# Patient Record
Sex: Male | Born: 1960
Health system: Southern US, Community
[De-identification: ages and names within clinical notes are randomized; demographics above are authoritative.]

## PROBLEM LIST (undated history)

## (undated) ENCOUNTER — Emergency Department: Admission: EM | Discharge status: Absent without leave | Payer: BC Managed Care – PPO

## (undated) DIAGNOSIS — I10 Essential (primary) hypertension: Secondary | ICD-10-CM

## (undated) DIAGNOSIS — K219 Gastro-esophageal reflux disease without esophagitis: Secondary | ICD-10-CM

## (undated) DIAGNOSIS — I219 Acute myocardial infarction, unspecified: Secondary | ICD-10-CM

## (undated) DIAGNOSIS — F1010 Alcohol abuse, uncomplicated: Secondary | ICD-10-CM

## (undated) DIAGNOSIS — E7800 Pure hypercholesterolemia, unspecified: Secondary | ICD-10-CM

## (undated) DIAGNOSIS — F513 Sleepwalking [somnambulism]: Secondary | ICD-10-CM

## (undated) DIAGNOSIS — I609 Nontraumatic subarachnoid hemorrhage, unspecified: Secondary | ICD-10-CM

## (undated) DIAGNOSIS — F32A Depression, unspecified: Secondary | ICD-10-CM

## (undated) DIAGNOSIS — F329 Major depressive disorder, single episode, unspecified: Secondary | ICD-10-CM

## (undated) HISTORY — DX: Nontraumatic subarachnoid hemorrhage, unspecified: I60.9

## (undated) HISTORY — PX: KNEE SURGERY: SHX244

## (undated) HISTORY — PX: APPENDECTOMY: SHX54

---
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
## 2006-11-16 ENCOUNTER — Other Ambulatory Visit: Payer: Self-pay

## 2006-11-16 ENCOUNTER — Emergency Department: Payer: Self-pay | Admitting: Emergency Medicine

## 2009-07-15 ENCOUNTER — Observation Stay: Payer: Self-pay | Admitting: *Deleted

## 2015-07-28 ENCOUNTER — Ambulatory Visit
Admission: RE | Admit: 2015-07-28 | Discharge: 2015-07-28 | Disposition: A | Discharge status: Home / Self Care | Payer: BLUE CROSS/BLUE SHIELD | Source: Ambulatory Visit | Attending: Family Medicine | Admitting: Family Medicine

## 2015-07-28 ENCOUNTER — Other Ambulatory Visit: Payer: Self-pay | Admitting: Family Medicine

## 2015-07-28 DIAGNOSIS — M25562 Pain in left knee: Secondary | ICD-10-CM

## 2015-07-28 DIAGNOSIS — M25561 Pain in right knee: Secondary | ICD-10-CM | POA: Insufficient documentation

## 2015-10-18 DIAGNOSIS — H52223 Regular astigmatism, bilateral: Secondary | ICD-10-CM | POA: Diagnosis not present

## 2015-10-18 DIAGNOSIS — H5203 Hypermetropia, bilateral: Secondary | ICD-10-CM | POA: Diagnosis not present

## 2015-10-18 DIAGNOSIS — H524 Presbyopia: Secondary | ICD-10-CM | POA: Diagnosis not present

## 2015-11-09 DIAGNOSIS — C44619 Basal cell carcinoma of skin of left upper limb, including shoulder: Secondary | ICD-10-CM | POA: Diagnosis not present

## 2015-11-09 DIAGNOSIS — L570 Actinic keratosis: Secondary | ICD-10-CM | POA: Diagnosis not present

## 2015-11-09 DIAGNOSIS — D225 Melanocytic nevi of trunk: Secondary | ICD-10-CM | POA: Diagnosis not present

## 2015-11-09 DIAGNOSIS — D485 Neoplasm of uncertain behavior of skin: Secondary | ICD-10-CM | POA: Diagnosis not present

## 2015-12-05 DIAGNOSIS — L905 Scar conditions and fibrosis of skin: Secondary | ICD-10-CM | POA: Diagnosis not present

## 2015-12-05 DIAGNOSIS — D225 Melanocytic nevi of trunk: Secondary | ICD-10-CM | POA: Diagnosis not present

## 2016-07-08 DIAGNOSIS — I219 Acute myocardial infarction, unspecified: Secondary | ICD-10-CM

## 2016-07-08 HISTORY — DX: Acute myocardial infarction, unspecified: I21.9

## 2016-11-20 ENCOUNTER — Emergency Department: Payer: BLUE CROSS/BLUE SHIELD

## 2016-11-20 ENCOUNTER — Emergency Department
Admission: EM | Admit: 2016-11-20 | Discharge: 2016-11-20 | Disposition: A | Discharge status: Home / Self Care | Payer: BLUE CROSS/BLUE SHIELD | Attending: Emergency Medicine | Admitting: Emergency Medicine

## 2016-11-20 ENCOUNTER — Encounter: Payer: Self-pay | Admitting: Emergency Medicine

## 2016-11-20 DIAGNOSIS — W182XXA Fall in (into) shower or empty bathtub, initial encounter: Secondary | ICD-10-CM | POA: Diagnosis not present

## 2016-11-20 DIAGNOSIS — Y939 Activity, unspecified: Secondary | ICD-10-CM | POA: Insufficient documentation

## 2016-11-20 DIAGNOSIS — Y999 Unspecified external cause status: Secondary | ICD-10-CM | POA: Diagnosis not present

## 2016-11-20 DIAGNOSIS — S0181XA Laceration without foreign body of other part of head, initial encounter: Secondary | ICD-10-CM | POA: Insufficient documentation

## 2016-11-20 DIAGNOSIS — R079 Chest pain, unspecified: Secondary | ICD-10-CM | POA: Diagnosis not present

## 2016-11-20 DIAGNOSIS — F10929 Alcohol use, unspecified with intoxication, unspecified: Secondary | ICD-10-CM

## 2016-11-20 DIAGNOSIS — S02401A Maxillary fracture, unspecified, initial encounter for closed fracture: Secondary | ICD-10-CM | POA: Diagnosis not present

## 2016-11-20 DIAGNOSIS — S0231XA Fracture of orbital floor, right side, initial encounter for closed fracture: Secondary | ICD-10-CM | POA: Insufficient documentation

## 2016-11-20 DIAGNOSIS — F17210 Nicotine dependence, cigarettes, uncomplicated: Secondary | ICD-10-CM | POA: Insufficient documentation

## 2016-11-20 DIAGNOSIS — S199XXA Unspecified injury of neck, initial encounter: Secondary | ICD-10-CM | POA: Diagnosis not present

## 2016-11-20 DIAGNOSIS — I10 Essential (primary) hypertension: Secondary | ICD-10-CM | POA: Diagnosis not present

## 2016-11-20 DIAGNOSIS — F10129 Alcohol abuse with intoxication, unspecified: Secondary | ICD-10-CM | POA: Insufficient documentation

## 2016-11-20 DIAGNOSIS — Y92009 Unspecified place in unspecified non-institutional (private) residence as the place of occurrence of the external cause: Secondary | ICD-10-CM | POA: Diagnosis not present

## 2016-11-20 DIAGNOSIS — W19XXXA Unspecified fall, initial encounter: Secondary | ICD-10-CM

## 2016-11-20 DIAGNOSIS — S0990XA Unspecified injury of head, initial encounter: Secondary | ICD-10-CM | POA: Diagnosis not present

## 2016-11-20 DIAGNOSIS — S0993XA Unspecified injury of face, initial encounter: Secondary | ICD-10-CM | POA: Diagnosis present

## 2016-11-20 DIAGNOSIS — R0781 Pleurodynia: Secondary | ICD-10-CM | POA: Diagnosis not present

## 2016-11-20 DIAGNOSIS — S0101XA Laceration without foreign body of scalp, initial encounter: Secondary | ICD-10-CM | POA: Diagnosis not present

## 2016-11-20 HISTORY — DX: Essential (primary) hypertension: I10

## 2016-11-20 HISTORY — DX: Gastro-esophageal reflux disease without esophagitis: K21.9

## 2016-11-20 HISTORY — DX: Sleepwalking (somnambulism): F51.3

## 2016-11-20 LAB — COMPREHENSIVE METABOLIC PANEL
ALK PHOS: 57 U/L (ref 38–126)
ALT: 29 U/L (ref 17–63)
AST: 29 U/L (ref 15–41)
Albumin: 4.2 g/dL (ref 3.5–5.0)
Anion gap: 11 (ref 5–15)
BUN: 14 mg/dL (ref 6–20)
CALCIUM: 9.4 mg/dL (ref 8.9–10.3)
CHLORIDE: 96 mmol/L — AB (ref 101–111)
CO2: 25 mmol/L (ref 22–32)
CREATININE: 0.79 mg/dL (ref 0.61–1.24)
GFR calc Af Amer: >60 mL/min (ref >60)
GFR calc non Af Amer: >60 mL/min (ref >60)
Glucose, Bld: 100 mg/dL — ABNORMAL HIGH (ref 65–99)
Potassium: 3.9 mmol/L (ref 3.5–5.1)
Sodium: 132 mmol/L — ABNORMAL LOW (ref 135–145)
Total Bilirubin: 0.6 mg/dL (ref 0.3–1.2)
Total Protein: 7.6 g/dL (ref 6.5–8.1)

## 2016-11-20 LAB — CBC WITH DIFFERENTIAL/PLATELET
Basophils Absolute: 0.1 10*3/uL (ref 0–0.1)
Basophils Relative: 1 %
Eosinophils Absolute: 0 10*3/uL (ref 0–0.7)
Eosinophils Relative: 0 %
HEMATOCRIT: 48.4 % (ref 40.0–52.0)
Hemoglobin: 16.8 g/dL (ref 13.0–18.0)
LYMPHS PCT: 13 %
Lymphs Abs: 1.4 10*3/uL (ref 1.0–3.6)
MCH: 32.9 pg (ref 26.0–34.0)
MCHC: 34.8 g/dL (ref 32.0–36.0)
MCV: 94.8 fL (ref 80.0–100.0)
MONO ABS: 1.1 10*3/uL — AB (ref 0.2–1.0)
MONOS PCT: 11 %
NEUTROS ABS: 8 10*3/uL — AB (ref 1.4–6.5)
Neutrophils Relative %: 75 %
Platelets: 296 10*3/uL (ref 150–440)
RBC: 5.11 MIL/uL (ref 4.40–5.90)
RDW: 15.1 % — AB (ref 11.5–14.5)
WBC: 10.6 10*3/uL (ref 3.8–10.6)

## 2016-11-20 LAB — ETHANOL: ALCOHOL ETHYL (B): 139 mg/dL — AB (ref <5)

## 2016-11-20 LAB — TROPONIN I: Troponin I: <0.03 ng/mL (ref <0.03)

## 2016-11-20 LAB — MAGNESIUM: Magnesium: 1.8 mg/dL (ref 1.7–2.4)

## 2016-11-20 MED ORDER — AMOXICILLIN-POT CLAVULANATE 875-125 MG PO TABS
1.0000 | ORAL_TABLET | Freq: Once | ORAL | Status: DC
  Filled 2016-11-20: qty 1

## 2016-11-20 MED ORDER — ONDANSETRON 4 MG PO TBDP
4.0000 mg | ORAL_TABLET | Freq: Once | ORAL | Status: AC
  Administered 2016-11-20: 4 mg via ORAL
  Filled 2016-11-20: qty 1

## 2016-11-20 MED ORDER — CLINDAMYCIN HCL 150 MG PO CAPS
300.0000 mg | ORAL_CAPSULE | Freq: Once | ORAL | Status: AC
  Administered 2016-11-20: 300 mg via ORAL
  Filled 2016-11-20: qty 2

## 2016-11-20 MED ORDER — ACETAMINOPHEN 500 MG PO TABS
ORAL_TABLET | ORAL | Status: AC
Start: 2016-11-20 — End: 2016-11-20
  Administered 2016-11-20: 1000 mg
  Filled 2016-11-20: qty 2

## 2016-11-20 MED ORDER — ONDANSETRON 4 MG PO TBDP: ORAL_TABLET | ORAL | 0 refills | Status: DC

## 2016-11-20 MED ORDER — CLINDAMYCIN HCL 300 MG PO CAPS: 300.0000 mg | ORAL_CAPSULE | Freq: Four times a day (QID) | ORAL | 0 refills | Status: AC

## 2016-11-20 MED ORDER — LIDOCAINE HCL (PF) 1 % IJ SOLN
5.0000 mL | Freq: Once | INTRAMUSCULAR | Status: AC
  Administered 2016-11-20: 5 mL
  Filled 2016-11-20: qty 5

## 2016-11-20 NOTE — ED Notes (Signed)
Wound sutured  Tolerated well  D/c inst to pt.  Iv dced.  Pt alert.  Speech clear.

## 2016-11-20 NOTE — Discharge Instructions (Signed)
You have been seen in the Emergency Department (ED) today for a fall.  Your work up does not show any concerning injuries.  Please take over-the-counter ibuprofen and/or Tylenol as needed for your pain (unless you have an allergy or your doctor as told you not to take them), or take any prescribed medication as instructed.  Please follow up with your doctor regarding today's Emergency Department (ED) visit and your recent fall.  You should have your sutures removed in about a week at your doctor's office.  Return to the ED if you have any headache, confusion, slurred speech, weakness/numbness of any arm or leg, or any increased pain.

## 2016-11-20 NOTE — ED Notes (Addendum)
Pt has laceration above right eyebrow and bruising to right side of face.  Pt fell down a flight of steps and does not remember what happened.   Pt reports drinking 2 beers after work today.  Denies drug use.  No neck or back pain.  No headache. Pt also reports right anterior rib pain.  No sob.  No  Chest pain.   Pt alert  Speech clear.   Wife with pt.

## 2016-11-20 NOTE — ED Provider Notes (Signed)
[facility] Emergency Department Provider Note  ____________________________________________   First MD Initiated Contact with Patient 11/20/16 1724     (approximate)  I have reviewed the triage vital signs and the nursing notes.   HISTORY  Chief Complaint Head Injury; Facial Laceration; and Facial Swelling    HPI Dustin Velazquez is a 56 y.o. male who presents for evaluation of head and facial injury after an unwitnessed fall at home.  He is generally healthy but does have a history of sleepwalking.  He reports that he gets up every day at 3 AM to go to work and then came home in the early afternoon, had a few beers, and fell asleep in his chair down stairs.  The next thing he knew he was in the shower upstairs with blood on the floor and a missing tooth and pain in his face and head.  His wife came home shortly thereafter and saw that there was also blood around the chair where he presumably fell initially.  He has no memory of any of that.  He denies chest pain except for point tenderness in the right lateral ribs where he thinks he must have fallen.  He has no neck pain, shortness of breath, abdominal pain, or pain in any of his extremities.  He is ambulatory without difficulty and denies any dizziness or lightheadedness.  Only alert and oriented in spite of the obvious head and facial injuries on the right side.  He describes the pain as moderate and aching and throbbing.  Touching his face and head makes it worse and nothing makes it better.  There is a laceration above his right eyebrow but leading is well controlled.  He reports that he is up-to-date on his tetanus vaccination due [place] the city.   Past Medical History:  Diagnosis Date  . GERD (gastroesophageal reflux disease)   . Hypertension   . Sleep walking disorder     There are no active problems to display for this patient.   Past Surgical History:  Procedure Laterality Date  .  APPENDECTOMY    . KNEE SURGERY      Prior to Admission medications   Medication Sig Start Date End Date Taking? Authorizing Provider  aspirin EC 81 MG tablet Take 81 mg by mouth daily.   Yes [provider]  hydrochlorothiazide (HYDRODIURIL) 25 MG tablet Take 25 mg by mouth daily. 09/09/16  Yes [provider]  lisinopril (PRINIVIL,ZESTRIL) 40 MG tablet Take 40 mg by mouth daily. 11/07/16  Yes [provider]  omeprazole (PRILOSEC) 20 MG capsule Take 20 mg by mouth daily. 11/19/16  Yes [provider]  clindamycin (CLEOCIN) 300 MG capsule Take 1 capsule (300 mg total) by mouth 4 (four) times daily. 11/20/16 11/27/16  Loleta Rose, MD  ondansetron (ZOFRAN ODT) 4 MG disintegrating tablet Allow 1-2 tablets to dissolve in your mouth every 8 hours as needed for nausea/vomiting 11/20/16   Loleta Rose, MD    Allergies Compazine [prochlorperazine edisylate]  History reviewed. No pertinent family history.  Social History Social History  Substance Use Topics  . Smoking status: Current Every Day Smoker    Types: Cigarettes  . Smokeless tobacco: Never Used  . Alcohol use Yes    Review of Systems Constitutional: No fever/chills Eyes: No visual changes.  Black eye on the right with some swelling ENT: No sore throat. Cardiovascular: Denies chest pain except for right lateral point rib tenderness Respiratory: Denies shortness  of breath. Gastrointestinal: No abdominal pain.  No nausea, no vomiting.  No diarrhea.  No constipation. Genitourinary: Negative for dysuria. Musculoskeletal: Negative for neck and back pain. Integumentary: Negative for rash. Laceration above right eyebrow with contusion and swelling Neurological: generalized throbbing headache.  No focal numbness nor weakness, no dizziness.  ____________________________________________   PHYSICAL EXAM:  VITAL SIGNS: ED Triage Vitals  Enc Vitals Group     BP 11/20/16 1630 136/80     Pulse Rate  11/20/16 1630 98     Resp 11/20/16 1630 18     Temp 11/20/16 1630 98.9 F (37.2 C)     Temp Source 11/20/16 1630 Oral     SpO2 11/20/16 1630 98 %     Weight 11/20/16 1631 194 lb (88 kg)     Height 11/20/16 1631 5\' 9"  (1.753 m)     Head Circumference --      Peak Flow --      Pain Score 11/20/16 1630 0     Pain Loc --      Pain Edu? --      Excl. in GC? --     Constitutional: Alert and oriented. No acute distress. Eyes: Periorbital swelling on the right side with ecchymosis consistent with a contusion to the right side of his face.  Extraocular motion is intact and pupils are equal and reactive bilaterally.  There is tenderness to palpation around the eye.  Laceration above right eyebrow as described below in the skin exam Head: Evidence of right forehead and right facial contusion/trauma as described above. Nose: No congestion/rhinnorhea. Mouth/Throat: Mucous membranes are moist.  Bottom center Missing tooth that the patient says was not missing prior to the fall but no other obvious dental injury Neck: No stridor.  No meningeal signs.  No cervical spine tenderness to palpation. Cardiovascular: Normal rate, regular rhythm. Good peripheral circulation. Grossly normal heart sounds. Respiratory: Normal respiratory effort.  No retractions. Lungs CTAB.  Easily reproducible point tenderness to palpation of the right lateral ribs just below the nipple Gastrointestinal: Soft and nontender. No distention.  Musculoskeletal: No lower extremity tenderness nor edema. No gross deformities of extremities. Neurologic:  Normal speech and language. No gross focal neurologic deficits are appreciated.  Skin:  3.8-cm laceration above right eyebrow, repaired as described in procedure note Psychiatric: Mood and affect are normal. Speech and behavior are normal.  ____________________________________________   LABS (all labs ordered are listed, but only abnormal results are displayed)  Labs Reviewed    COMPREHENSIVE METABOLIC PANEL - Abnormal; Notable for the following:       Result Value   Sodium 132 (*)    Chloride 96 (*)    Glucose, Bld 100 (*)    All other components within normal limits  ETHANOL - Abnormal; Notable for the following:    Alcohol, Ethyl (B) 139 (*)    All other components within normal limits  CBC WITH DIFFERENTIAL/PLATELET - Abnormal; Notable for the following:    RDW 15.1 (*)    Neutro Abs 8.0 (*)    Monocytes Absolute 1.1 (*)    All other components within normal limits  TROPONIN I  MAGNESIUM   ____________________________________________  EKG  ED ECG REPORT I, Narjis Mira, the attending physician, personally viewed and interpreted this ECG.  Date: 11/20/2016 EKG Time: 18:10 Rate: 87 Rhythm: normal sinus rhythm QRS Axis: normal Intervals: normal ST/T Wave abnormalities: normal Conduction Disturbances: none Narrative Interpretation: unremarkable  ____________________________________________  RADIOLOGY   Dg Ribs Unilateral  W/chest Right  Result Date: 11/20/2016 CLINICAL DATA:  Fall in shower. Right rib and chest pain. Initial encounter. EXAM: RIGHT RIBS AND CHEST - 3+ VIEW COMPARISON:  07/14/2009 FINDINGS: No fracture or other bone lesions are seen involving the ribs. There is no evidence of pneumothorax or pleural effusion. Both lungs are clear. Heart size and mediastinal contours are within normal limits. IMPRESSION: Negative. Electronically Signed   By: Myles Rosenthal M.D.   On: 11/20/2016 18:02   Ct Head Wo Contrast  Result Date: 11/20/2016 CLINICAL DATA:  Initial evaluation for acute trauma, fall. EXAM: CT HEAD WITHOUT CONTRAST CT MAXILLOFACIAL WITHOUT CONTRAST CT CERVICAL SPINE WITHOUT CONTRAST TECHNIQUE: Multidetector CT imaging of the head, cervical spine, and maxillofacial structures were performed using the standard protocol without intravenous contrast. Multiplanar CT image reconstructions of the cervical spine and maxillofacial  structures were also generated. COMPARISON:  None. FINDINGS: CT HEAD FINDINGS Brain: Mild age-related cerebral atrophy. The the no acute intracranial hemorrhage. No evidence for acute large vessel territory infarct. No mass lesion, midline shift or mass effect. No hydrocephalus. No extra-axial fluid collection. Vascular: No hyperdense vessel. Scattered vascular calcifications noted within the carotid siphons. Skull: Soft tissue contusion with laceration present at the right forehead/ periorbital region. Scalp soft tissues otherwise unremarkable. Calvarium intact. Other: Trace left mastoid effusion noted. CT MAXILLOFACIAL FINDINGS Osseous: Zygomatic arches intact. Subtle acute fracture through the posterior wall of the right maxillary sinus, extending into the right orbital floor. No other acute maxillary fracture. Pterygoid plates intact. Nasal bones intact. Nasal septum bowed to the right but intact. No acute mandibular fracture. Mandibular condyles normally situated. No acute abnormality about the dentition. Poor dentition noted. Orbits: Globes intact. No retro-orbital hematoma. There is a subtle acute nondisplaced fracture through the right orbital floor (series 13, image 28). Extension through the posterior wall of the right max O sinus. Extra-ocular muscles remain normally position within the bony orbit. Lamina papyracea and orbital roof remain intact. No acute abnormality about the bony left orbit. Sinuses: Layering blood present within the left max O sinus. Scattered mucosal thickening within the ethmoidal air cells. Soft tissues: Right soft tissue contusion with laceration at the right forehead and periorbital region. No other appreciable soft tissue injury within the face. CT CERVICAL SPINE FINDINGS Alignment: Straightening with slight reversal of the normal cervical lordosis. Trace anterolisthesis of C5 on C6, likely chronic. Skull base and vertebrae: Skullbase intact. Normal C1-2 arch articulations  preserved. Dens is intact. Vertebral body heights maintained. No acute fracture. Soft tissues and spinal canal: Soft tissues of the neck demonstrate no acute abnormality. No prevertebral edema. Vascular calcifications about the left bifurcation. Disc levels: Moderate degenerative spondylolysis present at C5-6 and C6-7. Upper chest: Visualized upper chest demonstrates no acute abnormality. Visualized lung apices are clear. No apical pneumothorax. Mild paraseptal emphysema noted at the right lung apex. IMPRESSION: CT HEAD: No acute intracranial process identified. CT MAXILLOFACIAL: 1. Acute nondisplaced fracture involving the right orbital floor with extension through the posterior wall of the right maxillary sinus. Intact globes with no retro-orbital hematoma. 2. Soft tissue contusion with laceration at the right forehead and periorbital region. CT CERVICAL SPINE: 1. No acute traumatic injury within cervical spine. 2. Moderate degenerate spondylolysis at C5-6 and C6-7. Electronically Signed   By: Rise Mu M.D.   On: 11/20/2016 18:27   Ct Cervical Spine Wo Contrast  Result Date: 11/20/2016 CLINICAL DATA:  Initial evaluation for acute trauma, fall. EXAM: CT HEAD WITHOUT CONTRAST CT MAXILLOFACIAL WITHOUT  CONTRAST CT CERVICAL SPINE WITHOUT CONTRAST TECHNIQUE: Multidetector CT imaging of the head, cervical spine, and maxillofacial structures were performed using the standard protocol without intravenous contrast. Multiplanar CT image reconstructions of the cervical spine and maxillofacial structures were also generated. COMPARISON:  None. FINDINGS: CT HEAD FINDINGS Brain: Mild age-related cerebral atrophy. The the no acute intracranial hemorrhage. No evidence for acute large vessel territory infarct. No mass lesion, midline shift or mass effect. No hydrocephalus. No extra-axial fluid collection. Vascular: No hyperdense vessel. Scattered vascular calcifications noted within the carotid siphons. Skull: Soft  tissue contusion with laceration present at the right forehead/ periorbital region. Scalp soft tissues otherwise unremarkable. Calvarium intact. Other: Trace left mastoid effusion noted. CT MAXILLOFACIAL FINDINGS Osseous: Zygomatic arches intact. Subtle acute fracture through the posterior wall of the right maxillary sinus, extending into the right orbital floor. No other acute maxillary fracture. Pterygoid plates intact. Nasal bones intact. Nasal septum bowed to the right but intact. No acute mandibular fracture. Mandibular condyles normally situated. No acute abnormality about the dentition. Poor dentition noted. Orbits: Globes intact. No retro-orbital hematoma. There is a subtle acute nondisplaced fracture through the right orbital floor (series 13, image 28). Extension through the posterior wall of the right max O sinus. Extra-ocular muscles remain normally position within the bony orbit. Lamina papyracea and orbital roof remain intact. No acute abnormality about the bony left orbit. Sinuses: Layering blood present within the left max O sinus. Scattered mucosal thickening within the ethmoidal air cells. Soft tissues: Right soft tissue contusion with laceration at the right forehead and periorbital region. No other appreciable soft tissue injury within the face. CT CERVICAL SPINE FINDINGS Alignment: Straightening with slight reversal of the normal cervical lordosis. Trace anterolisthesis of C5 on C6, likely chronic. Skull base and vertebrae: Skullbase intact. Normal C1-2 arch articulations preserved. Dens is intact. Vertebral body heights maintained. No acute fracture. Soft tissues and spinal canal: Soft tissues of the neck demonstrate no acute abnormality. No prevertebral edema. Vascular calcifications about the left bifurcation. Disc levels: Moderate degenerative spondylolysis present at C5-6 and C6-7. Upper chest: Visualized upper chest demonstrates no acute abnormality. Visualized lung apices are clear. No  apical pneumothorax. Mild paraseptal emphysema noted at the right lung apex. IMPRESSION: CT HEAD: No acute intracranial process identified. CT MAXILLOFACIAL: 1. Acute nondisplaced fracture involving the right orbital floor with extension through the posterior wall of the right maxillary sinus. Intact globes with no retro-orbital hematoma. 2. Soft tissue contusion with laceration at the right forehead and periorbital region. CT CERVICAL SPINE: 1. No acute traumatic injury within cervical spine. 2. Moderate degenerate spondylolysis at C5-6 and C6-7. Electronically Signed   By: Rise Mu M.D.   On: 11/20/2016 18:27   Ct Maxillofacial Wo Cm  Result Date: 11/20/2016 CLINICAL DATA:  Initial evaluation for acute trauma, fall. EXAM: CT HEAD WITHOUT CONTRAST CT MAXILLOFACIAL WITHOUT CONTRAST CT CERVICAL SPINE WITHOUT CONTRAST TECHNIQUE: Multidetector CT imaging of the head, cervical spine, and maxillofacial structures were performed using the standard protocol without intravenous contrast. Multiplanar CT image reconstructions of the cervical spine and maxillofacial structures were also generated. COMPARISON:  None. FINDINGS: CT HEAD FINDINGS Brain: Mild age-related cerebral atrophy. The the no acute intracranial hemorrhage. No evidence for acute large vessel territory infarct. No mass lesion, midline shift or mass effect. No hydrocephalus. No extra-axial fluid collection. Vascular: No hyperdense vessel. Scattered vascular calcifications noted within the carotid siphons. Skull: Soft tissue contusion with laceration present at the right forehead/ periorbital region. Scalp soft  tissues otherwise unremarkable. Calvarium intact. Other: Trace left mastoid effusion noted. CT MAXILLOFACIAL FINDINGS Osseous: Zygomatic arches intact. Subtle acute fracture through the posterior wall of the right maxillary sinus, extending into the right orbital floor. No other acute maxillary fracture. Pterygoid plates intact. Nasal  bones intact. Nasal septum bowed to the right but intact. No acute mandibular fracture. Mandibular condyles normally situated. No acute abnormality about the dentition. Poor dentition noted. Orbits: Globes intact. No retro-orbital hematoma. There is a subtle acute nondisplaced fracture through the right orbital floor (series 13, image 28). Extension through the posterior wall of the right max O sinus. Extra-ocular muscles remain normally position within the bony orbit. Lamina papyracea and orbital roof remain intact. No acute abnormality about the bony left orbit. Sinuses: Layering blood present within the left max O sinus. Scattered mucosal thickening within the ethmoidal air cells. Soft tissues: Right soft tissue contusion with laceration at the right forehead and periorbital region. No other appreciable soft tissue injury within the face. CT CERVICAL SPINE FINDINGS Alignment: Straightening with slight reversal of the normal cervical lordosis. Trace anterolisthesis of C5 on C6, likely chronic. Skull base and vertebrae: Skullbase intact. Normal C1-2 arch articulations preserved. Dens is intact. Vertebral body heights maintained. No acute fracture. Soft tissues and spinal canal: Soft tissues of the neck demonstrate no acute abnormality. No prevertebral edema. Vascular calcifications about the left bifurcation. Disc levels: Moderate degenerative spondylolysis present at C5-6 and C6-7. Upper chest: Visualized upper chest demonstrates no acute abnormality. Visualized lung apices are clear. No apical pneumothorax. Mild paraseptal emphysema noted at the right lung apex. IMPRESSION: CT HEAD: No acute intracranial process identified. CT MAXILLOFACIAL: 1. Acute nondisplaced fracture involving the right orbital floor with extension through the posterior wall of the right maxillary sinus. Intact globes with no retro-orbital hematoma. 2. Soft tissue contusion with laceration at the right forehead and periorbital region. CT  CERVICAL SPINE: 1. No acute traumatic injury within cervical spine. 2. Moderate degenerate spondylolysis at C5-6 and C6-7. Electronically Signed   By: Rise Mu M.D.   On: 11/20/2016 18:27    ____________________________________________   PROCEDURES  Critical Care performed: No   Procedure(s) performed:   Marland KitchenMarland KitchenLaceration Repair Date/Time: 11/20/2016 7:29 PM Performed by: Loleta Rose Authorized by: Loleta Rose   Consent:    Consent obtained:  Verbal   Consent given by:  Patient   Risks discussed:  Infection, pain, retained foreign body, poor cosmetic result and poor wound healing Anesthesia (see MAR for exact dosages):    Anesthesia method:  Local infiltration   Local anesthetic:  Lidocaine 1% w/o epi Laceration details:    Location:  Face   Face location:  Forehead   Length (cm):  3.8 Repair type:    Repair type:  Simple Exploration:    Hemostasis achieved with:  Direct pressure   Wound exploration: entire depth of wound probed and visualized     Contaminated: no   Treatment:    Area cleansed with:  Saline   Amount of cleaning:  Extensive   Irrigation solution:  Sterile saline   Visualized foreign bodies/material removed: no   Skin repair:    Repair method:  Sutures   Suture size:  5-0   Suture material:  Nylon   Suture technique:  Simple interrupted   Number of sutures:  4 Approximation:    Approximation:  Close   Vermilion border: well-aligned   Post-procedure details:    Dressing:  Sterile dressing   Patient tolerance of procedure:  Tolerated well, no immediate complications     ____________________________________________   INITIAL IMPRESSION / ASSESSMENT AND PLAN / ED COURSE  Pertinent labs & imaging results that were available during my care of the patient were reviewed by me and considered in my medical decision making (see chart for details).  The patient does have some significant facial and forehead trauma and I will obtain CT scans  of his head, maxillofacial, and cervical spine to make sure there is no bony injury.  He has no clinical evidence of entrapment at this time.  His fall is likely the result of both some alcohol consumption before his nap and his chronic sleep walking, and he has no shortness of breath or chest pain.  I will check his electrolytes and a CBC as well as a troponin to try to make sure there is no evidence of any other acute or emergent condition leading to his fall, but in the absence of anything else I suspect he simply had a fall for the reasons described above and should be appropriate for outpatient follow-up.  Testing rib radiographs are also pending to determine if he has a rib fracture.  His forehead laceration will require suturing.   Clinical Course as of Nov 21 1935  Wed Nov 20, 2016  1812 No evidence of rib fracture(s) DG Ribs Unilateral W/Chest Right [CF]  1930 patient has a nondisplaced fracture of the right orbital floor with extension into the maxillary sinuses.  I ordered Augmentin but the patient refused because it has serious GI side effects.  The recommended alternative is clindamycin to which he agreed.  I gave him a first dose here and I am giving him ENT follow-up information.  I gave my usual precautions including no nose blowing, opening his mouth when he sneezes, etc.  He will follow up within the week as well as having his sutures removed.  There is no indication of any other acute or emergent medical condition at this time and he and his wife are comfortable with plan to go home.  I gave my usual and customary return precautions.     [CF]    Clinical Course User Index [CF] Loleta Rose, MD    ____________________________________________  FINAL CLINICAL IMPRESSION(S) / ED DIAGNOSES  Final diagnoses:  Closed fracture of right orbital floor, initial encounter [facility])  Maxillary sinus fracture, closed, initial encounter [facility])  Facial laceration, initial encounter  Injury of  head, initial encounter  Fall, initial encounter  Alcoholic intoxication with complication [facility])     MEDICATIONS GIVEN DURING THIS VISIT:  Medications  clindamycin (CLEOCIN) capsule 300 mg   acetaminophen (TYLENOL) 500 MG tablet (1,000 mg  Given 11/20/16 1815)  lidocaine (PF) (XYLOCAINE) 1 % injection 5 mL (5 mLs Other Given 11/20/16 1847)  ondansetron (ZOFRAN-ODT) disintegrating tablet 4 mg (4 mg Oral Given 11/20/16 1847)     NEW OUTPATIENT MEDICATIONS STARTED DURING THIS VISIT:  New Prescriptions   CLINDAMYCIN (CLEOCIN) 300 MG CAPSULE    Take 1 capsule (300 mg total) by mouth 4 (four) times daily.   ONDANSETRON (ZOFRAN ODT) 4 MG DISINTEGRATING TABLET    Allow 1-2 tablets to dissolve in your mouth every 8 hours as needed for nausea/vomiting    Modified Medications   No medications on file    Discontinued Medications   No medications on file     Note:  This document was prepared using Dragon voice recognition software and may include unintentional dictation errors.    York Cerise,  Kandee Keen, MD 11/20/16 [phone]

## 2016-11-20 NOTE — ED Notes (Signed)
Patient transported to CT 

## 2016-11-20 NOTE — ED Notes (Signed)
Pt has a headache,   meds given.  Pt alert  Speech clear

## 2016-11-20 NOTE — ED Triage Notes (Signed)
Pt took a nap today and woke up in the shower. Pt reports history of sleep walking. Pt states he thought he fell in the shower but pt wife reports seeing blood on the floor near the chair where he took the nap. Laceration noted to area above right eyebrow. Bleeding controlled with dressing. Swelling noted to right eye area. Pt denies dizziness. Pt alert and oriented in triage.

## 2016-12-29 ENCOUNTER — Inpatient Hospital Stay
Admission: EM | Admit: 2016-12-29 | Discharge: 2016-12-31 | DRG: 247 | Disposition: A | Discharge status: Home / Self Care | Payer: BLUE CROSS/BLUE SHIELD | Attending: Internal Medicine | Admitting: Internal Medicine

## 2016-12-29 ENCOUNTER — Inpatient Hospital Stay (HOSPITAL_COMMUNITY)
Admit: 2016-12-29 | Discharge: 2016-12-29 | Disposition: A | Discharge status: Home / Self Care | Payer: BLUE CROSS/BLUE SHIELD | Attending: Internal Medicine | Admitting: Internal Medicine

## 2016-12-29 ENCOUNTER — Encounter: Payer: Self-pay | Admitting: Emergency Medicine

## 2016-12-29 ENCOUNTER — Emergency Department: Payer: BLUE CROSS/BLUE SHIELD

## 2016-12-29 DIAGNOSIS — E785 Hyperlipidemia, unspecified: Secondary | ICD-10-CM | POA: Diagnosis present

## 2016-12-29 DIAGNOSIS — E7800 Pure hypercholesterolemia, unspecified: Secondary | ICD-10-CM | POA: Diagnosis present

## 2016-12-29 DIAGNOSIS — K219 Gastro-esophageal reflux disease without esophagitis: Secondary | ICD-10-CM | POA: Diagnosis present

## 2016-12-29 DIAGNOSIS — K210 Gastro-esophageal reflux disease with esophagitis: Secondary | ICD-10-CM | POA: Diagnosis not present

## 2016-12-29 DIAGNOSIS — R079 Chest pain, unspecified: Secondary | ICD-10-CM

## 2016-12-29 DIAGNOSIS — Z8349 Family history of other endocrine, nutritional and metabolic diseases: Secondary | ICD-10-CM

## 2016-12-29 DIAGNOSIS — Z888 Allergy status to other drugs, medicaments and biological substances status: Secondary | ICD-10-CM | POA: Diagnosis not present

## 2016-12-29 DIAGNOSIS — Z716 Tobacco abuse counseling: Secondary | ICD-10-CM | POA: Diagnosis not present

## 2016-12-29 DIAGNOSIS — I2510 Atherosclerotic heart disease of native coronary artery without angina pectoris: Secondary | ICD-10-CM | POA: Diagnosis not present

## 2016-12-29 DIAGNOSIS — I214 Non-ST elevation (NSTEMI) myocardial infarction: Principal | ICD-10-CM | POA: Diagnosis present

## 2016-12-29 DIAGNOSIS — F513 Sleepwalking [somnambulism]: Secondary | ICD-10-CM | POA: Diagnosis not present

## 2016-12-29 DIAGNOSIS — I10 Essential (primary) hypertension: Secondary | ICD-10-CM | POA: Diagnosis present

## 2016-12-29 DIAGNOSIS — Z79899 Other long term (current) drug therapy: Secondary | ICD-10-CM

## 2016-12-29 DIAGNOSIS — F17210 Nicotine dependence, cigarettes, uncomplicated: Secondary | ICD-10-CM | POA: Diagnosis not present

## 2016-12-29 DIAGNOSIS — Z7982 Long term (current) use of aspirin: Secondary | ICD-10-CM | POA: Diagnosis not present

## 2016-12-29 DIAGNOSIS — Z720 Tobacco use: Secondary | ICD-10-CM | POA: Diagnosis not present

## 2016-12-29 DIAGNOSIS — Z8249 Family history of ischemic heart disease and other diseases of the circulatory system: Secondary | ICD-10-CM | POA: Diagnosis not present

## 2016-12-29 HISTORY — DX: Pure hypercholesterolemia, unspecified: E78.00

## 2016-12-29 LAB — BASIC METABOLIC PANEL
Anion gap: 9 (ref 5–15)
BUN: 14 mg/dL (ref 6–20)
CHLORIDE: 96 mmol/L — AB (ref 101–111)
CO2: 30 mmol/L (ref 22–32)
CREATININE: 0.96 mg/dL (ref 0.61–1.24)
Calcium: 9.4 mg/dL (ref 8.9–10.3)
GFR calc Af Amer: >60 mL/min (ref >60)
GFR calc non Af Amer: >60 mL/min (ref >60)
Glucose, Bld: 102 mg/dL — ABNORMAL HIGH (ref 65–99)
POTASSIUM: 4.3 mmol/L (ref 3.5–5.1)
Sodium: 135 mmol/L (ref 135–145)

## 2016-12-29 LAB — APTT: aPTT: 29 seconds (ref 24–36)

## 2016-12-29 LAB — ECHOCARDIOGRAM COMPLETE
Height: 69 in
Weight: 3120 oz

## 2016-12-29 LAB — CBC
HEMATOCRIT: 49.1 % (ref 40.0–52.0)
Hemoglobin: 17 g/dL (ref 13.0–18.0)
MCH: 33.5 pg (ref 26.0–34.0)
MCHC: 34.7 g/dL (ref 32.0–36.0)
MCV: 96.5 fL (ref 80.0–100.0)
PLATELETS: 322 10*3/uL (ref 150–440)
RBC: 5.09 MIL/uL (ref 4.40–5.90)
RDW: 14.5 % (ref 11.5–14.5)
WBC: 7.8 10*3/uL (ref 3.8–10.6)

## 2016-12-29 LAB — HEPARIN LEVEL (UNFRACTIONATED)
HEPARIN UNFRACTIONATED: 0.38 [IU]/mL (ref 0.30–0.70)
Heparin Unfractionated: 0.17 IU/mL — ABNORMAL LOW (ref 0.30–0.70)

## 2016-12-29 LAB — PROTIME-INR
INR: 0.93
Prothrombin Time: 12.5 seconds (ref 11.4–15.2)

## 2016-12-29 LAB — TROPONIN I
TROPONIN I: 1.28 ng/mL — AB (ref <0.03)
Troponin I: 0.1 ng/mL (ref <0.03)
Troponin I: 0.6 ng/mL (ref <0.03)
Troponin I: 1.65 ng/mL (ref <0.03)

## 2016-12-29 MED ORDER — SODIUM CHLORIDE 0.9 % IV SOLN
INTRAVENOUS | Status: DC
  Administered 2016-12-29: 09:00:00 via INTRAVENOUS

## 2016-12-29 MED ORDER — FAMOTIDINE 20 MG PO TABS
40.0000 mg | ORAL_TABLET | Freq: Once | ORAL | Status: DC
  Filled 2016-12-29: qty 2

## 2016-12-29 MED ORDER — ACETAMINOPHEN 325 MG PO TABS: 650.0000 mg | ORAL_TABLET | ORAL | Status: DC | PRN

## 2016-12-29 MED ORDER — NITROGLYCERIN 0.4 MG SL SUBL: 0.4000 mg | SUBLINGUAL_TABLET | SUBLINGUAL | Status: DC | PRN

## 2016-12-29 MED ORDER — ATORVASTATIN CALCIUM 20 MG PO TABS
40.0000 mg | ORAL_TABLET | Freq: Every day | ORAL | Status: DC
  Administered 2016-12-29 – 2016-12-30 (×2): 40 mg via ORAL
  Filled 2016-12-29 (×2): qty 2

## 2016-12-29 MED ORDER — ONDANSETRON HCL 4 MG/2ML IJ SOLN: 4.0000 mg | Freq: Four times a day (QID) | INTRAMUSCULAR | Status: DC | PRN

## 2016-12-29 MED ORDER — GI COCKTAIL ~~LOC~~
30.0000 mL | Freq: Once | ORAL | Status: DC
  Filled 2016-12-29: qty 30

## 2016-12-29 MED ORDER — HEPARIN BOLUS VIA INFUSION
4000.0000 [IU] | Freq: Once | INTRAVENOUS | Status: AC
  Administered 2016-12-29: 4000 [IU] via INTRAVENOUS
  Filled 2016-12-29: qty 4000

## 2016-12-29 MED ORDER — ASPIRIN EC 81 MG PO TBEC
81.0000 mg | DELAYED_RELEASE_TABLET | Freq: Every day | ORAL | Status: DC
  Administered 2016-12-31: 81 mg via ORAL
  Filled 2016-12-29: qty 1

## 2016-12-29 MED ORDER — MORPHINE SULFATE (PF) 2 MG/ML IV SOLN: 2.0000 mg | INTRAVENOUS | Status: DC | PRN

## 2016-12-29 MED ORDER — ASPIRIN EC 81 MG PO TBEC: 81.0000 mg | DELAYED_RELEASE_TABLET | Freq: Every day | ORAL | Status: DC

## 2016-12-29 MED ORDER — NICOTINE 21 MG/24HR TD PT24
21.0000 mg | MEDICATED_PATCH | Freq: Every day | TRANSDERMAL | Status: DC
  Administered 2016-12-29 – 2016-12-31 (×3): 21 mg via TRANSDERMAL
  Filled 2016-12-29 (×3): qty 1

## 2016-12-29 MED ORDER — HEPARIN (PORCINE) IN NACL 100-0.45 UNIT/ML-% IJ SOLN
1450.0000 [IU]/h | INTRAMUSCULAR | Status: DC
  Administered 2016-12-29: 1050 [IU]/h via INTRAVENOUS
  Filled 2016-12-29 (×2): qty 250

## 2016-12-29 MED ORDER — ASPIRIN 81 MG PO CHEW
162.0000 mg | CHEWABLE_TABLET | Freq: Once | ORAL | Status: AC
  Administered 2016-12-29: 162 mg via ORAL
  Filled 2016-12-29: qty 2

## 2016-12-29 MED ORDER — ASPIRIN 81 MG PO CHEW
324.0000 mg | CHEWABLE_TABLET | ORAL | Status: AC
Start: 2016-12-29 — End: 2016-12-29
  Administered 2016-12-29: 324 mg via ORAL

## 2016-12-29 MED ORDER — LISINOPRIL 20 MG PO TABS
40.0000 mg | ORAL_TABLET | Freq: Every day | ORAL | Status: DC
Start: 2016-12-29 — End: 2016-12-31
  Administered 2016-12-29 – 2016-12-31 (×3): 40 mg via ORAL
  Filled 2016-12-29 (×4): qty 2

## 2016-12-29 MED ORDER — METOPROLOL TARTRATE 25 MG PO TABS
25.0000 mg | ORAL_TABLET | Freq: Two times a day (BID) | ORAL | Status: DC
  Administered 2016-12-29 – 2016-12-31 (×5): 25 mg via ORAL
  Filled 2016-12-29 (×5): qty 1

## 2016-12-29 MED ORDER — HEPARIN BOLUS VIA INFUSION
2500.0000 [IU] | Freq: Once | INTRAVENOUS | Status: AC
  Administered 2016-12-29: 2500 [IU] via INTRAVENOUS
  Filled 2016-12-29: qty 2500

## 2016-12-29 MED ORDER — ASPIRIN 300 MG RE SUPP: 300.0000 mg | RECTAL | Status: AC

## 2016-12-29 MED ORDER — PANTOPRAZOLE SODIUM 40 MG PO TBEC
40.0000 mg | DELAYED_RELEASE_TABLET | Freq: Every day | ORAL | Status: DC
  Administered 2016-12-29 – 2016-12-31 (×3): 40 mg via ORAL
  Filled 2016-12-29 (×4): qty 1

## 2016-12-29 NOTE — H&P (Signed)
[facility] - [place] at [facility]   PATIENT NAME: Dustin Velazquez    MR#:  [id number]  DATE OF BIRTH:  1960-12-21  DATE OF ADMISSION:  12/29/2016  PRIMARY CARE PHYSICIAN: Gilles Chiquito, MD   REQUESTING/REFERRING PHYSICIAN:   CHIEF COMPLAINT:   Chief Complaint  Patient presents with  . Chest Pain    HISTORY OF PRESENT ILLNESS: Dustin Velazquez  is a 56 y.o. male with a known history of Gastroesophageal replace disease, hyperlipidemia, hypertension presented to the emergency room with chest pain since yesterday morning. Patient woke up yesterday morning with chest pain which is located in the retrosternal area. He felt it as heartburn and described it as 6 out of 10 on a scale of 1-10. Patient also woke up this morning with similar complaints. He was evaluated in the emergency room EKG showed normal sinus rhythm with no ST segment elevation. His first set of troponin is elevated. Patient was started on IV heparin drip and started on anticoagulation. He had stress test couple of years ago. Hospitalist service was consulted for further care of the patient.  PAST MEDICAL HISTORY:   Past Medical History:  Diagnosis Date  . GERD (gastroesophageal reflux disease)   . Hypercholesteremia   . Hypertension   . Sleep walking disorder     PAST SURGICAL HISTORY: Past Surgical History:  Procedure Laterality Date  . APPENDECTOMY    . KNEE SURGERY      SOCIAL HISTORY:  Social History  Substance Use Topics  . Smoking status: Current Every Day Smoker    Packs/day: 1.00    Years: 30.00    Types: Cigarettes  . Smokeless tobacco: Never Used  . Alcohol use Yes    FAMILY HISTORY:  Family History  Problem Relation Age of Onset  . Hypertension Mother   . Hyperlipidemia Father     DRUG ALLERGIES:  Allergies  Allergen Reactions  . Compazine [Prochlorperazine Edisylate] Other (See Comments)    seizure    REVIEW OF SYSTEMS:   CONSTITUTIONAL: No fever,  fatigue or weakness.  EYES: No blurred or double vision.  EARS, NOSE, AND THROAT: No tinnitus or ear pain.  RESPIRATORY: No cough, shortness of breath, wheezing or hemoptysis.  CARDIOVASCULAR: Has chest pain,  No orthopnea, edema.  GASTROINTESTINAL: No nausea, vomiting, diarrhea or abdominal pain.  GENITOURINARY: No dysuria, hematuria.  ENDOCRINE: No polyuria, nocturia,  HEMATOLOGY: No anemia, easy bruising or bleeding SKIN: No rash or lesion. MUSCULOSKELETAL: No joint pain or arthritis.   NEUROLOGIC: No tingling, numbness, weakness.  PSYCHIATRY: No anxiety or depression.   MEDICATIONS AT HOME:  Prior to Admission medications   Medication Sig Start Date End Date Taking? Authorizing Provider  aspirin EC 81 MG tablet Take 81 mg by mouth daily.   Yes [provider]  hydrochlorothiazide (HYDRODIURIL) 25 MG tablet Take 25 mg by mouth daily. 09/09/16  Yes [provider]  lisinopril (PRINIVIL,ZESTRIL) 40 MG tablet Take 40 mg by mouth daily. 11/07/16  Yes [provider]  omeprazole (PRILOSEC) 20 MG capsule Take 20 mg by mouth daily. 11/19/16  Yes [provider]      PHYSICAL EXAMINATION:   VITAL SIGNS: Blood pressure (!) 150/90, pulse 94, temperature 98.7 F (37.1 C), temperature source Oral, resp. rate 15, height 5\' 9"  (1.753 m), weight 88.5 kg (195 lb), SpO2 97 %.  GENERAL:  56 y.o.-year-old patient lying in the bed with no acute distress.  EYES: Pupils equal, round, reactive to light and  accommodation. No scleral icterus. Extraocular muscles intact.  HEENT: Head atraumatic, normocephalic. Oropharynx and nasopharynx clear.  NECK:  Supple, no jugular venous distention. No thyroid enlargement, no tenderness.  LUNGS: Normal breath sounds bilaterally, no wheezing, rales,rhonchi or crepitation. No use of accessory muscles of respiration.  CARDIOVASCULAR: S1, S2 normal. No murmurs, rubs, or gallops.  ABDOMEN: Soft, nontender, nondistended. Bowel sounds  present. No organomegaly or mass.  EXTREMITIES: No pedal edema, cyanosis, or clubbing.  NEUROLOGIC: Cranial nerves II through XII are intact. Muscle strength 5/5 in all extremities. Sensation intact. Gait not checked.  PSYCHIATRIC: The patient is alert and oriented x 3.  SKIN: No obvious rash, lesion, or ulcer.   LABORATORY PANEL:   CBC  Recent Labs Lab 12/29/16 0444  WBC 7.8  HGB 17.0  HCT 49.1  PLT 322  MCV 96.5  MCH 33.5  MCHC 34.7  RDW 14.5   ------------------------------------------------------------------------------------------------------------------  Chemistries   Recent Labs Lab 12/29/16 0444  NA 135  K 4.3  CL 96*  CO2 30  GLUCOSE 102*  BUN 14  CREATININE 0.96  CALCIUM 9.4   ------------------------------------------------------------------------------------------------------------------ estimated creatinine clearance is 94.5 mL/min (by C-G formula based on SCr of 0.96 mg/dL). ------------------------------------------------------------------------------------------------------------------ No results for input(s): TSH, T4TOTAL, T3FREE, THYROIDAB in the last 72 hours.  Invalid input(s): FREET3   Coagulation profile  Recent Labs Lab 12/29/16 0551  INR 0.93   ------------------------------------------------------------------------------------------------------------------- No results for input(s): DDIMER in the last 72 hours. -------------------------------------------------------------------------------------------------------------------  Cardiac Enzymes  Recent Labs Lab 12/29/16 0444  TROPONINI 0.10*   ------------------------------------------------------------------------------------------------------------------ Invalid input(s): POCBNP  ---------------------------------------------------------------------------------------------------------------  Urinalysis No results found for: COLORURINE, APPEARANCEUR, LABSPEC, PHURINE,  GLUCOSEU, HGBUR, BILIRUBINUR, KETONESUR, PROTEINUR, UROBILINOGEN, NITRITE, LEUKOCYTESUR   RADIOLOGY: Dg Chest 2 View  Result Date: 12/29/2016 CLINICAL DATA:  Chest pain this morning. EXAM: CHEST  2 VIEW COMPARISON:  11/20/2016 FINDINGS: The cardiomediastinal contours are normal. Small hiatal hernia. The lungs are clear. Pulmonary vasculature is normal. No consolidation, pleural effusion, or pneumothorax. No acute osseous abnormalities are seen. Degenerative change in the spine. IMPRESSION: No active cardiopulmonary disease. Electronically Signed   By: Rubye Oaks M.D.   On: 12/29/2016 05:17    EKG: Orders placed or performed during the hospital encounter of 12/29/16  . EKG 12-Lead  . EKG 12-Lead  . ED EKG within 10 minutes  . ED EKG within 10 minutes  . Repeat EKG  . Repeat EKG    IMPRESSION AND PLAN: 56 year old male patient with history of hypertension, GERD presented to the emergency room with chest pain. Admitting diagnosis 1. Non-STEMI 2. GERD 3. Hypertension 4. Hyperlipidemia Treatment plan Admit patient to telemetry IV heparin drip Start patient on aspirin, statin Pain management with IV morphine and nitrates Cardiology consultation Echocardiogram Cycle troponin  All the records are reviewed and case discussed with ED provider. Management plans discussed with the patient, family and they are in agreement.  CODE STATUS:FULL CODE Code Status History    This patient does not have a recorded code status. Please follow your organizational policy for patients in this situation.       TOTAL TIME TAKING CARE OF THIS PATIENT: 50 minutes.    Ihor Austin M.D on 12/29/2016 at 6:38 AM  Between 7am to 6pm - Pager - [phone]  After 6pm go to [email or web address] - password [email or web address] [facility]  [place] [facility]  Office  [phone]  CC: Primary care physician; Gilles Chiquito, MD

## 2016-12-29 NOTE — Plan of Care (Signed)
Problem: Pain Managment: Goal: General experience of comfort will improve Outcome: Progressing No c/o pain since patient admitted into room. Continue to assess and monitor. Patient educated to notify RN when pain occurs.   Problem: Cardiac: Goal: Ability to achieve and maintain adequate cardiovascular perfusion will improve Outcome: Progressing No c/o pain since admitted to room. Continue to assess and monitor. Patient educated to call RN when experiencing symptoms/pain.

## 2016-12-29 NOTE — Progress Notes (Signed)
ANTICOAGULATION CONSULT NOTE - Initial Consult  Pharmacy Consult for heparin Indication: chest pain/ACS  Allergies  Allergen Reactions  . Compazine [Prochlorperazine Edisylate] Other (See Comments)    seizure    Patient Measurements: Height: 5\' 9"  (175.3 cm) Weight: 195 lb (88.5 kg) IBW/kg (Calculated) : 70.7 Heparin Dosing Weight: 88.5 kg  Vital Signs: Temp: 98.7 F (37.1 C) (06/24 1232) Temp Source: Oral (06/24 1232) BP: 146/86 (06/24 1232) Pulse Rate: 90 (06/24 1232)  Labs:  Recent Labs  12/29/16 0444 12/29/16 0551 12/29/16 0859 12/29/16 1141 12/29/16 1429 12/29/16 1857  HGB 17.0  --   --   --   --   --   HCT 49.1  --   --   --   --   --   PLT 322  --   --   --   --   --   APTT  --  29  --   --   --   --   LABPROT  --  12.5  --   --   --   --   INR  --  0.93  --   --   --   --   HEPARINUNFRC  --   --   --  0.17*  --  0.38  CREATININE 0.96  --   --   --   --   --   TROPONINI 0.10*  --  0.60*  --  1.28*  --     Estimated Creatinine Clearance: 94.5 mL/min (by C-G formula based on SCr of 0.96 mg/dL).   Medical History: Past Medical History:  Diagnosis Date  . GERD (gastroesophageal reflux disease)   . Hypercholesteremia   . Hypertension   . Sleep walking disorder     Medications:  Scheduled:  . [START ON 12/30/2016] aspirin EC  81 mg Oral Daily  . atorvastatin  40 mg Oral q1800  . lisinopril  40 mg Oral Daily  . metoprolol tartrate  25 mg Oral BID  . nicotine  21 mg Transdermal Daily  . pantoprazole  40 mg Oral Daily    Assessment: Patient admitted w/ burning CP and found to have trops of 0.10. Is being started on heparin drip  Goal of Therapy:  Heparin level 0.3-0.7 units/ml Monitor platelets by anticoagulation protocol: Yes   Plan:  Heparin level therapeutic at 0.38. Continue drip at 1450 units/hr and recheck in 6 hours.   Olene Floss, Pharm.D, BCPS Clinical Pharmacist  12/29/2016

## 2016-12-29 NOTE — Progress Notes (Signed)
ANTICOAGULATION CONSULT NOTE - Initial Consult  Pharmacy Consult for heparin Indication: chest pain/ACS  Allergies  Allergen Reactions  . Compazine [Prochlorperazine Edisylate] Other (See Comments)    seizure    Patient Measurements: Height: 5\' 9"  (175.3 cm) Weight: 195 lb (88.5 kg) IBW/kg (Calculated) : 70.7 Heparin Dosing Weight: 88.5 kg  Vital Signs: Temp: 98.7 F (37.1 C) (06/24 0447) Temp Source: Oral (06/24 0447) BP: 158/96 (06/24 0447) Pulse Rate: 104 (06/24 0447)  Labs:  Recent Labs  12/29/16 0444  HGB 17.0  HCT 49.1  PLT 322  CREATININE 0.96  TROPONINI 0.10*    Estimated Creatinine Clearance: 94.5 mL/min (by C-G formula based on SCr of 0.96 mg/dL).   Medical History: Past Medical History:  Diagnosis Date  . GERD (gastroesophageal reflux disease)   . Hypercholesteremia   . Hypertension   . Sleep walking disorder     Medications:  Scheduled:  . heparin  4,000 Units Intravenous Once    Assessment: Patient admitted w/ burning CP and found to have trops of 0.10. Is being started on heparin drip  Goal of Therapy:  Heparin level 0.3-0.7 units/ml Monitor platelets by anticoagulation protocol: Yes   Plan:  Give 4000 units bolus x 1  Will start drip @ 1050 units/hr and will check HL @ 1200. Baseline labs ordered.  Thomasene Ripple, PharmD, BCPS Clinical Pharmacist 12/29/2016

## 2016-12-29 NOTE — ED Triage Notes (Signed)
Pt says he woke Saturday am with burning to the center of his chest; took TUMS which seemed to relieve his pain; last night, after [place] the [place], he went out with some friends for drinks; pt says he went to bed around 930; woke around 4am with burning and tightness to his chest; non radiating; pt alert and oriented x 3; talking in complete coherent sentences

## 2016-12-29 NOTE — Progress Notes (Signed)
[facility] - [place] at [facility]   PATIENT NAME: Dustin Velazquez    MR#:  [id number]  DATE OF BIRTH:  Dec 05, 1960  SUBJECTIVE:  CHIEF COMPLAINT:   Chief Complaint  Patient presents with  . Chest Pain     Patient came with chest tightness to the hospital and found to have some EKG changes and slightly elevated troponin, currently no chest pain and on heparin drip.  REVIEW OF SYSTEMS:  CONSTITUTIONAL: No fever, fatigue or weakness.  EYES: No blurred or double vision.  EARS, NOSE, AND THROAT: No tinnitus or ear pain.  RESPIRATORY: No cough, shortness of breath, wheezing or hemoptysis.  CARDIOVASCULAR: No chest pain, orthopnea, edema.  GASTROINTESTINAL: No nausea, vomiting, diarrhea or abdominal pain.  GENITOURINARY: No dysuria, hematuria.  ENDOCRINE: No polyuria, nocturia,  HEMATOLOGY: No anemia, easy bruising or bleeding SKIN: No rash or lesion. MUSCULOSKELETAL: No joint pain or arthritis.   NEUROLOGIC: No tingling, numbness, weakness.  PSYCHIATRY: No anxiety or depression.   ROS  DRUG ALLERGIES:   Allergies  Allergen Reactions  . Compazine [Prochlorperazine Edisylate] Other (See Comments)    seizure    VITALS:  Blood pressure (!) 146/86, pulse 90, temperature 98.7 F (37.1 C), temperature source Oral, resp. rate (!) 22, height 5\' 9"  (1.753 m), weight 88.5 kg (195 lb), SpO2 95 %.  PHYSICAL EXAMINATION:  GENERAL:  56 y.o.-year-old patient lying in the bed with no acute distress.  EYES: Pupils equal, round, reactive to light and accommodation. No scleral icterus. Extraocular muscles intact.  HEENT: Head atraumatic, normocephalic. Oropharynx and nasopharynx clear.  NECK:  Supple, no jugular venous distention. No thyroid enlargement, no tenderness.  LUNGS: Normal breath sounds bilaterally, no wheezing, rales,rhonchi or crepitation. No use of accessory muscles of respiration.  CARDIOVASCULAR: S1, S2 normal. No murmurs, rubs, or gallops.  ABDOMEN: Soft,  nontender, nondistended. Bowel sounds present. No organomegaly or mass.  EXTREMITIES: No pedal edema, cyanosis, or clubbing.  NEUROLOGIC: Cranial nerves II through XII are intact. Muscle strength 5/5 in all extremities. Sensation intact. Gait not checked.  PSYCHIATRIC: The patient is alert and oriented x 3.  SKIN: No obvious rash, lesion, or ulcer.   Physical Exam LABORATORY PANEL:   CBC  Recent Labs Lab 12/29/16 0444  WBC 7.8  HGB 17.0  HCT 49.1  PLT 322   ------------------------------------------------------------------------------------------------------------------  Chemistries   Recent Labs Lab 12/29/16 0444  NA 135  K 4.3  CL 96*  CO2 30  GLUCOSE 102*  BUN 14  CREATININE 0.96  CALCIUM 9.4   ------------------------------------------------------------------------------------------------------------------  Cardiac Enzymes  Recent Labs Lab 12/29/16 0444 12/29/16 0859  TROPONINI 0.10* 0.60*   ------------------------------------------------------------------------------------------------------------------  RADIOLOGY:  Dg Chest 2 View  Result Date: 12/29/2016 CLINICAL DATA:  Chest pain this morning. EXAM: CHEST  2 VIEW COMPARISON:  11/20/2016 FINDINGS: The cardiomediastinal contours are normal. Small hiatal hernia. The lungs are clear. Pulmonary vasculature is normal. No consolidation, pleural effusion, or pneumothorax. No acute osseous abnormalities are seen. Degenerative change in the spine. IMPRESSION: No active cardiopulmonary disease. Electronically Signed   By: Rubye Oaks M.D.   On: 12/29/2016 05:17    ASSESSMENT AND PLAN:   Active Problems:   Non-STEMI (non-ST elevated myocardial infarction) (HCC)  * NSTEMI   IV heparin drip.   Added metoprolol, ASA, statin.   Check lipid panel and hemoglobin A1c.   Check echocardiogram and cardiology consult is appreciated.   Cardiac catheterization he suggested for tomorrow.  * Hypertension   Continue  lisinopril,  hold hydrochlorothiazide because of catheterization tomorrow and added metoprolol.  * Smoking   Counseled to quit smoking for 4 minutes and offered nicotine patch.    All the records are reviewed and case discussed with Care Management/Social Workerr. Management plans discussed with the patient, family and they are in agreement.  CODE STATUS: Full code.  TOTAL TIME TAKING CARE OF THIS PATIENT: 35.minutes.   I tried calling his wife on the phone to discuss the plan and left a voicemail.  POSSIBLE D/C IN 1-2 DAYS, DEPENDING ON CLINICAL CONDITION.   Altamese Dilling M.D on 12/29/2016   Between 7am to 6pm - Pager - [phone]  After 6pm go to [email or web address] - password [email or web address]  [facility] [place] [facility]  Office  [phone]  CC: Primary care physician; Gilles Chiquito, MD  Note: This dictation was prepared with Dragon dictation along with smaller phrase technology. Any transcriptional errors that result from this process are unintentional.

## 2016-12-29 NOTE — ED Provider Notes (Signed)
[facility] Emergency Department Provider Note  ____________________________________________   First MD Initiated Contact with Patient 12/29/16 0530     (approximate)  I have reviewed the triage vital signs and the nursing notes.   HISTORY  Chief Complaint Chest Pain    HPI Dustin Velazquez is a 56 y.o. male self presents to the emergency department with moderate severity substernal burning pressure-like discomfort that awoke him from sleep roughly an hour and a half prior to arrival. He has a past medical history of hypertension and a previous stroke but is never had a myocardial infarction. He's had intermittent chest discomfort for the past 24 hours or so but he attributed it to heartburn. He woke this morning and took 6 Tums and drank sodium bicarbonate with water which did not help so he drove himself to the emergency department. His pain is intermittent but he hasn't noted anything in particular that is made it stronger or better. His pain is associated with some shortness of breath and nausea. It is nonradiating.   Past Medical History:  Diagnosis Date  . GERD (gastroesophageal reflux disease)   . Hypercholesteremia   . Hypertension   . Sleep walking disorder     There are no active problems to display for this patient.   Past Surgical History:  Procedure Laterality Date  . APPENDECTOMY    . KNEE SURGERY      Prior to Admission medications   Medication Sig Start Date End Date Taking? Authorizing Provider  aspirin EC 81 MG tablet Take 81 mg by mouth daily.   Yes [provider]  hydrochlorothiazide (HYDRODIURIL) 25 MG tablet Take 25 mg by mouth daily. 09/09/16  Yes [provider]  lisinopril (PRINIVIL,ZESTRIL) 40 MG tablet Take 40 mg by mouth daily. 11/07/16  Yes [provider]  omeprazole (PRILOSEC) 20 MG capsule Take 20 mg by mouth daily. 11/19/16  Yes [provider]    Allergies Compazine  [prochlorperazine edisylate]  History reviewed. No pertinent family history.  Social History Social History  Substance Use Topics  . Smoking status: Current Every Day Smoker    Packs/day: 1.00    Years: 30.00    Types: Cigarettes  . Smokeless tobacco: Never Used  . Alcohol use Yes    Review of Systems Constitutional: No fever/chills Eyes: No visual changes. ENT: No sore throat. Cardiovascular: Positive chest pain. Respiratory: Positive shortness of breath. Gastrointestinal: No abdominal pain.  Positive nausea, no vomiting.  No diarrhea.  No constipation. Genitourinary: Negative for dysuria. Musculoskeletal: Negative for back pain. Skin: Negative for rash. Neurological: Negative for headaches, focal weakness or numbness.   ____________________________________________   PHYSICAL EXAM:  VITAL SIGNS: ED Triage Vitals [12/29/16 0447]  Enc Vitals Group     BP (!) 158/96     Pulse Rate (!) 104     Resp 18     Temp 98.7 F (37.1 C)     Temp Source Oral     SpO2 98 %     Weight 195 lb (88.5 kg)     Height 5\' 9"  (1.753 m)     Head Circumference      Peak Flow      Pain Score 8     Pain Loc      Pain Edu?      Excl. in GC?     Constitutional: Alert and oriented 4 appears somewhat uncomfortable no diaphoresis Eyes: PERRL EOMI. Head: Atraumatic. Nose: No congestion/rhinnorhea. Mouth/Throat: No trismus  Neck: No stridor.   Cardiovascular: Tachycardic rate, regular rhythm. Grossly normal heart sounds.  Good peripheral circulation. Respiratory: Normal respiratory effort.  No retractions. Lungs CTAB and moving good air Gastrointestinal: Obese nontender Musculoskeletal: No lower extremity edema   Neurologic:  Normal speech and language. No gross focal neurologic deficits are appreciated. Skin:  Skin is warm, dry and intact. No rash noted. Psychiatric: Mood and affect are normal. Speech and behavior are normal.    ____________________________________________     DIFFERENTIAL includes but not limited to  Acute coronary syndrome, Boerhaave syndrome, pulmonary embolism, pneumothorax, gastric reflux ____________________________________________   LABS (all labs ordered are listed, but only abnormal results are displayed)  Labs Reviewed  BASIC METABOLIC PANEL - Abnormal; Notable for the following:       Result Value   Chloride 96 (*)    Glucose, Bld 102 (*)    All other components within normal limits  TROPONIN I - Abnormal; Notable for the following:    Troponin I 0.10 (*)    All other components within normal limits  CBC  Elevated troponin with normal creatinine suggestive of acute myocardial ischemia __________________________________________  EKG  ED ECG REPORT I, Merrily Brittle, the attending physician, personally viewed and interpreted this ECG.  Date: 12/29/2016 Rate: 95 Rhythm: normal sinus rhythm QRS Axis: normal Intervals: normal ST/T Wave abnormalities: Anterior ST depression Narrative Interpretation: Abnormal  ____________________________________________  RADIOLOGY  Chest x-ray with no acute cardiac disease ____________________________________________   PROCEDURES  Procedure(s) performed: no  Procedures  Critical Care performed: Yes  CRITICAL CARE Performed by: Merrily Brittle   Total critical care time: 35 minutes  Critical care time was exclusive of separately billable procedures and treating other patients.  Critical care was necessary to treat or prevent imminent or life-threatening deterioration.  Critical care was time spent personally by me on the following activities: development of treatment plan with patient and/or surrogate as well as nursing, discussions with consultants, evaluation of patient's response to treatment, examination of patient, obtaining history from patient or surrogate, ordering and performing treatments and interventions, ordering and review of laboratory studies, ordering and  review of radiographic studies, pulse oximetry and re-evaluation of patient's condition.   Observation: no ____________________________________________   INITIAL IMPRESSION / ASSESSMENT AND PLAN / ED COURSE  Pertinent labs & imaging results that were available during my care of the patient were reviewed by me and considered in my medical decision making (see chart for details).  The patient arrives with a concerning story of substernal pressure like chest pain associated with some shortness of breath. His EKG shows diffuse anterior ST depression in his first troponin is positive at 0.10 with a normal creatinine. At this point I believe he is having a non-ST elevation myocardial infarction and will give him aspirin as well as placing him on a heparin drip. He'll require inpatient admission      ____________________________________________   FINAL CLINICAL IMPRESSION(S) / ED DIAGNOSES  Final diagnoses:  Chest pain, unspecified type  NSTEMI (non-ST elevated myocardial infarction) (HCC)      NEW MEDICATIONS STARTED DURING THIS VISIT:  New Prescriptions   No medications on file     Note:  This document was prepared using Dragon voice recognition software and may include unintentional dictation errors.     Merrily Brittle, MD 12/29/16 [phone]

## 2016-12-29 NOTE — Consult Note (Signed)
Primary Physician:  Tarri Abernethy   Primary Cardiologist:  New   HPI:  Pt is a 56 yo with hx of GERD, HL, HTN  Came to ED on 6/23 with CP   Pt says that over the past week he had intermittent episodes of "indigestion"  Short lived  Eased on own Yesterday morning he had severe episode of what he thought was indigestion  Severe chest pressure  Ate a lot of maalox/tums  Went away eventually  During day did an errand, went to pool, had dinner with family  He had no further discomfort but say taht he was more fatigued  Says overall he has felt more tired this week  Today at 4 AM woke up with severe chest pressure "indigestion" sensation.  Came to ED by 4"30  By 5 AM had eased  Not having any now.    He denies bitter taste in mouth like he has had in past for indigestion/reflux    Initial troponin 0.1  Repeat 0.6      Tired last week     COuple days hearburn     Nuclear stress test in 2011 normal  Symptoms were different  Past Medical History:  Diagnosis Date  . GERD (gastroesophageal reflux disease)   . Hypercholesteremia   . Hypertension   . Sleep walking disorder     Medications Prior to Admission  Medication Sig Dispense Refill  . aspirin EC 81 MG tablet Take 81 mg by mouth daily.    . hydrochlorothiazide (HYDRODIURIL) 25 MG tablet Take 25 mg by mouth daily.  2  . lisinopril (PRINIVIL,ZESTRIL) 40 MG tablet Take 40 mg by mouth daily.  98  . omeprazole (PRILOSEC) 20 MG capsule Take 20 mg by mouth daily.  2     . [START ON 12/30/2016] aspirin EC  81 mg Oral Daily  . atorvastatin  40 mg Oral q1800  . lisinopril  40 mg Oral Daily  . metoprolol tartrate  25 mg Oral BID  . pantoprazole  40 mg Oral Daily    Infusions: . sodium chloride 75 mL/hr at 12/29/16 0922  . heparin 1,050 Units/hr (12/29/16 0601)    Allergies  Allergen Reactions  . Compazine [Prochlorperazine Edisylate] Other (See Comments)    seizure    Social History   Social History  . Marital  status: Married    Spouse name: N/A  . Number of children: N/A  . Years of education: N/A   Occupational History  .  [employer]   Social History Main Topics  . Smoking status: Current Every Day Smoker    Packs/day: 1.00    Years: 30.00    Types: Cigarettes  . Smokeless tobacco: Never Used  . Alcohol use Yes  . Drug use: No  . Sexual activity: Not on file   Other Topics Concern  . Not on file   Social History Narrative  . No narrative on file    Family History  Problem Relation Age of Onset  . Hypertension Mother   . Hyperlipidemia Father     REVIEW OF SYSTEMS:  All systems reviewed  Negative to the above problem except as noted above.    PHYSICAL EXAM: Vitals:   12/29/16 0730 12/29/16 0830  BP: 123/83 (!) 146/81  Pulse: 86 92  Resp: 20 (!) 22  Temp:  99.1 F (37.3 C)    No intake or output data in the 24 hours ending 12/29/16 1208  General:  Well  appearing. No respiratory difficulty HEENT: normal Neck: supple. no JVD. Carotids 2+ bilat; no bruits. No lymphadenopathy or thryomegaly appreciated. Cor: PMI nondisplaced. Regular rate & rhythm. No rubs, gallops or murmurs. Lungs: clear Abdomen: soft, nontender, nondistended. No hepatosplenomegaly. No bruits or masses. Good bowel sounds. Extremities: no cyanosis, clubbing, rash, edema Neuro: alert & oriented x 3, cranial nerves grossly intact. moves all 4 extremities w/o difficulty. Affect pleasant.  ECG:  ST  108 bpm  0.5 to 1 mm ST depression V3, V4.    Results for orders placed or performed during the hospital encounter of 12/29/16 (from the past 24 hour(s))  Basic metabolic panel     Status: Abnormal   Collection Time: 12/29/16  4:44 AM  Result Value Ref Range   Sodium 135 135 - 145 mmol/L   Potassium 4.3 3.5 - 5.1 mmol/L   Chloride 96 (L) 101 - 111 mmol/L   CO2 30 22 - 32 mmol/L   Glucose, Bld 102 (H) 65 - 99 mg/dL   BUN 14 6 - 20 mg/dL   Creatinine, Ser 1.61 0.61 - 1.24 mg/dL   Calcium 9.4 8.9  - 09.6 mg/dL   GFR calc non Af Amer >60 >60 mL/min   GFR calc Af Amer >60 >60 mL/min   Anion gap 9 5 - 15  CBC     Status: None   Collection Time: 12/29/16  4:44 AM  Result Value Ref Range   WBC 7.8 3.8 - 10.6 K/uL   RBC 5.09 4.40 - 5.90 MIL/uL   Hemoglobin 17.0 13.0 - 18.0 g/dL   HCT 04.5 40.9 - 81.1 %   MCV 96.5 80.0 - 100.0 fL   MCH 33.5 26.0 - 34.0 pg   MCHC 34.7 32.0 - 36.0 g/dL   RDW 91.4 78.2 - 95.6 %   Platelets 322 150 - 440 K/uL  Troponin I     Status: Abnormal   Collection Time: 12/29/16  4:44 AM  Result Value Ref Range   Troponin I 0.10 (HH) <0.03 ng/mL  Protime-INR     Status: None   Collection Time: 12/29/16  5:51 AM  Result Value Ref Range   Prothrombin Time 12.5 11.4 - 15.2 seconds   INR 0.93   APTT     Status: None   Collection Time: 12/29/16  5:51 AM  Result Value Ref Range   aPTT 29 24 - 36 seconds  Troponin I     Status: Abnormal   Collection Time: 12/29/16  8:59 AM  Result Value Ref Range   Troponin I 0.60 (HH) <0.03 ng/mL   Dg Chest 2 View  Result Date: 12/29/2016 CLINICAL DATA:  Chest pain this morning. EXAM: CHEST  2 VIEW COMPARISON:  11/20/2016 FINDINGS: The cardiomediastinal contours are normal. Small hiatal hernia. The lungs are clear. Pulmonary vasculature is normal. No consolidation, pleural effusion, or pneumothorax. No acute osseous abnormalities are seen. Degenerative change in the spine. IMPRESSION: No active cardiopulmonary disease. Electronically Signed   By: Rubye Oaks M.D.   On: 12/29/2016 05:17     ASSESSMENT:  Pt is a 56 yo with history of HTN, tobacco use who presents with chest discomfort  Has progressed from mild, intermitt spells to severe  Episode this am woke him  From sleep  Gone now  Episodes were different from his indigestion in past  Exm  Is unremarkable EKG has some subtle ST depression Labs signif for sl elevated troponin  I have discussed with pt and son  Given  hx, risks,abnormal blood work I would recomm L  heart cath to define anatomy  Risks/benefits described  Patient understands and agrees to proceed. COntinue heparin   Agree with b blocker  Titrate for HR / BP Agree with lipitor  Labs pending   Will plan for tomorrow AM    Pt told to buzz nursing if symptoms return  WIll change timeline  2  HTN   Follow on meds  3  Lipids  Pending   4  Tob  Counselled on cessation

## 2016-12-30 ENCOUNTER — Other Ambulatory Visit: Payer: Self-pay

## 2016-12-30 ENCOUNTER — Encounter: Payer: Self-pay | Admitting: *Deleted

## 2016-12-30 ENCOUNTER — Encounter
Admission: EM | Disposition: A | Discharge status: Home / Self Care | Payer: Self-pay | Source: Home / Self Care | Attending: Internal Medicine

## 2016-12-30 DIAGNOSIS — I2510 Atherosclerotic heart disease of native coronary artery without angina pectoris: Secondary | ICD-10-CM

## 2016-12-30 DIAGNOSIS — I214 Non-ST elevation (NSTEMI) myocardial infarction: Principal | ICD-10-CM

## 2016-12-30 DIAGNOSIS — E785 Hyperlipidemia, unspecified: Secondary | ICD-10-CM

## 2016-12-30 HISTORY — PX: LEFT HEART CATH AND CORONARY ANGIOGRAPHY: CATH118249

## 2016-12-30 HISTORY — PX: CORONARY STENT INTERVENTION: CATH118234

## 2016-12-30 LAB — BASIC METABOLIC PANEL
ANION GAP: 7 (ref 5–15)
BUN: 15 mg/dL (ref 6–20)
CALCIUM: 9.2 mg/dL (ref 8.9–10.3)
CO2: 27 mmol/L (ref 22–32)
Chloride: 101 mmol/L (ref 101–111)
Creatinine, Ser: 0.88 mg/dL (ref 0.61–1.24)
GFR calc Af Amer: >60 mL/min (ref >60)
Glucose, Bld: 102 mg/dL — ABNORMAL HIGH (ref 65–99)
Potassium: 3.6 mmol/L (ref 3.5–5.1)
Sodium: 135 mmol/L (ref 135–145)

## 2016-12-30 LAB — CBC
HEMATOCRIT: 44.6 % (ref 40.0–52.0)
Hemoglobin: 15.2 g/dL (ref 13.0–18.0)
MCH: 33 pg (ref 26.0–34.0)
MCHC: 34.2 g/dL (ref 32.0–36.0)
MCV: 96.4 fL (ref 80.0–100.0)
PLATELETS: 278 10*3/uL (ref 150–440)
RBC: 4.62 MIL/uL (ref 4.40–5.90)
RDW: 14.5 % (ref 11.5–14.5)
WBC: 9.2 10*3/uL (ref 3.8–10.6)

## 2016-12-30 LAB — LIPID PANEL
CHOLESTEROL: 202 mg/dL — AB (ref 0–200)
HDL: 49 mg/dL (ref >40)
LDL CALC: 128 mg/dL — AB (ref 0–99)
Total CHOL/HDL Ratio: 4.1 RATIO
Triglycerides: 127 mg/dL (ref <150)
VLDL: 25 mg/dL (ref 0–40)

## 2016-12-30 LAB — POCT ACTIVATED CLOTTING TIME: Activated Clotting Time: 241 seconds

## 2016-12-30 LAB — HEPARIN LEVEL (UNFRACTIONATED): Heparin Unfractionated: 0.48 IU/mL (ref 0.30–0.70)

## 2016-12-30 SURGERY — LEFT HEART CATH AND CORONARY ANGIOGRAPHY
Anesthesia: Moderate Sedation

## 2016-12-30 MED ORDER — ASPIRIN 81 MG PO CHEW
81.0000 mg | CHEWABLE_TABLET | ORAL | Status: AC
  Administered 2016-12-30: 81 mg via ORAL

## 2016-12-30 MED ORDER — NITROGLYCERIN 1 MG/10 ML FOR IR/CATH LAB
INTRA_ARTERIAL | Status: DC | PRN
  Administered 2016-12-30 (×2): 200 ug via INTRACORONARY

## 2016-12-30 MED ORDER — HEPARIN SODIUM (PORCINE) 1000 UNIT/ML IJ SOLN
INTRAMUSCULAR | Status: AC
  Filled 2016-12-30: qty 1

## 2016-12-30 MED ORDER — NITROGLYCERIN 5 MG/ML IV SOLN
INTRAVENOUS | Status: AC
  Filled 2016-12-30: qty 10

## 2016-12-30 MED ORDER — VERAPAMIL HCL 2.5 MG/ML IV SOLN
INTRAVENOUS | Status: AC
  Filled 2016-12-30: qty 2

## 2016-12-30 MED ORDER — FENTANYL CITRATE (PF) 100 MCG/2ML IJ SOLN
INTRAMUSCULAR | Status: AC
  Filled 2016-12-30: qty 2

## 2016-12-30 MED ORDER — IOPAMIDOL (ISOVUE-300) INJECTION 61%
INTRAVENOUS | Status: DC | PRN
  Administered 2016-12-30: 175 mL via INTRA_ARTERIAL

## 2016-12-30 MED ORDER — VERAPAMIL HCL 2.5 MG/ML IV SOLN
INTRAVENOUS | Status: DC | PRN
  Administered 2016-12-30 (×2): 2.5 mg via INTRAVENOUS

## 2016-12-30 MED ORDER — SODIUM CHLORIDE 0.9% FLUSH: 3.0000 mL | INTRAVENOUS | Status: DC | PRN

## 2016-12-30 MED ORDER — MIDAZOLAM HCL 2 MG/2ML IJ SOLN
INTRAMUSCULAR | Status: AC
  Filled 2016-12-30: qty 2

## 2016-12-30 MED ORDER — SODIUM CHLORIDE 0.9 % WEIGHT BASED INFUSION
1.0000 mL/kg/h | INTRAVENOUS | Status: AC
  Administered 2016-12-30: 1 mL/kg/h via INTRAVENOUS

## 2016-12-30 MED ORDER — HEPARIN SODIUM (PORCINE) 1000 UNIT/ML IJ SOLN
INTRAMUSCULAR | Status: DC | PRN
  Administered 2016-12-30: 4500 [IU] via INTRAVENOUS
  Administered 2016-12-30: 4000 [IU] via INTRAVENOUS
  Administered 2016-12-30: 1500 [IU] via INTRAVENOUS

## 2016-12-30 MED ORDER — SODIUM CHLORIDE 0.9 % IV SOLN: INTRAVENOUS | Status: DC

## 2016-12-30 MED ORDER — FENTANYL CITRATE (PF) 100 MCG/2ML IJ SOLN
INTRAMUSCULAR | Status: DC | PRN
  Administered 2016-12-30: 50 ug via INTRAVENOUS
  Administered 2016-12-30: 25 ug via INTRAVENOUS

## 2016-12-30 MED ORDER — TICAGRELOR 90 MG PO TABS
ORAL_TABLET | ORAL | Status: DC | PRN
  Administered 2016-12-30: 180 mg via ORAL

## 2016-12-30 MED ORDER — MIDAZOLAM HCL 2 MG/2ML IJ SOLN
INTRAMUSCULAR | Status: DC | PRN
  Administered 2016-12-30 (×2): 1 mg via INTRAVENOUS

## 2016-12-30 MED ORDER — TICAGRELOR 90 MG PO TABS
90.0000 mg | ORAL_TABLET | Freq: Two times a day (BID) | ORAL | Status: DC
  Administered 2016-12-30 – 2016-12-31 (×2): 90 mg via ORAL
  Filled 2016-12-30 (×3): qty 1

## 2016-12-30 MED ORDER — SODIUM CHLORIDE 0.9 % IV SOLN: 250.0000 mL | INTRAVENOUS | Status: DC | PRN

## 2016-12-30 MED ORDER — TICAGRELOR 90 MG PO TABS
ORAL_TABLET | ORAL | Status: AC
  Filled 2016-12-30: qty 1

## 2016-12-30 MED ORDER — ASPIRIN 81 MG PO CHEW
CHEWABLE_TABLET | ORAL | Status: AC
  Administered 2016-12-30: 09:00:00
  Filled 2016-12-30: qty 1

## 2016-12-30 MED ORDER — SODIUM CHLORIDE 0.9% FLUSH
3.0000 mL | Freq: Two times a day (BID) | INTRAVENOUS | Status: DC
  Administered 2016-12-31: 3 mL via INTRAVENOUS

## 2016-12-30 MED ORDER — SODIUM CHLORIDE 0.9% FLUSH: 3.0000 mL | Freq: Two times a day (BID) | INTRAVENOUS | Status: DC

## 2016-12-30 SURGICAL SUPPLY — 14 items
BALLN TREK RX 2.5X8 (BALLOONS) ×2
BALLN ~~LOC~~ TREK RX 3.0X12 (BALLOONS) ×2
BALLOON TREK RX 2.5X8 (BALLOONS) ×1 IMPLANT
BALLOON ~~LOC~~ TREK RX 3.0X12 (BALLOONS) ×1 IMPLANT
CATH OPTITORQUE JACKY 4.0 5F (CATHETERS) ×2 IMPLANT
CATH VISTA GUIDE 6FR JL3.5 (CATHETERS) ×2 IMPLANT
DEVICE INFLAT 30 PLUS (MISCELLANEOUS) ×2 IMPLANT
DEVICE RAD TR BAND REGULAR (VASCULAR PRODUCTS) ×2 IMPLANT
GLIDESHEATH SLEND SS 6F .021 (SHEATH) ×2 IMPLANT
KIT MANI 3VAL PERCEP (MISCELLANEOUS) ×2 IMPLANT
PACK CARDIAC CATH (CUSTOM PROCEDURE TRAY) ×2 IMPLANT
STENT RESOLUTE ONYX 3.0X15 (Permanent Stent) ×2 IMPLANT
WIRE ROSEN-J .035X260CM (WIRE) ×2 IMPLANT
WIRE RUNTHROUGH .014X180CM (WIRE) ×2 IMPLANT

## 2016-12-30 NOTE — Progress Notes (Signed)
Progress Note  Patient Name: Dustin Velazquez Date of Encounter: 12/30/2016  Primary Cardiologist: New (seen by Dr. Tenny Craw)  Subjective   No further chest discomfort.Troponin increased to 1.65.  Inpatient Medications    Scheduled Meds: . aspirin      . [MAR Hold] aspirin EC  81 mg Oral Daily  . [MAR Hold] atorvastatin  40 mg Oral q1800  . [MAR Hold] lisinopril  40 mg Oral Daily  . [MAR Hold] metoprolol tartrate  25 mg Oral BID  . [MAR Hold] nicotine  21 mg Transdermal Daily  . [MAR Hold] pantoprazole  40 mg Oral Daily  . sodium chloride flush  3 mL Intravenous Q12H   Continuous Infusions: . sodium chloride 75 mL/hr at 12/29/16 0922  . sodium chloride    . sodium chloride    . heparin Stopped (12/30/16 0819)   PRN Meds: sodium chloride, [MAR Hold] acetaminophen, fentaNYL, midazolam, [MAR Hold]  morphine injection, [MAR Hold] nitroGLYCERIN, [MAR Hold] ondansetron (ZOFRAN) IV, sodium chloride flush   Vital Signs    Vitals:   12/29/16 1232 12/29/16 2020 12/30/16 0436 12/30/16 0800  BP: (!) 146/86 135/84 127/74 (!) 152/91  Pulse: 90 84 66 84  Resp:  18 18 16   Temp: 98.7 F (37.1 C) 98.8 F (37.1 C) 98.1 F (36.7 C) 98.2 F (36.8 C)  TempSrc: Oral Oral Oral Oral  SpO2: 95% 92% 97% 98%  Weight:    195 lb (88.5 kg)  Height:    5\' 9"  (1.753 m)    Intake/Output Summary (Last 24 hours) at 12/30/16 0839 Last data filed at 12/30/16 0730  Gross per 24 hour  Intake          1482.43 ml  Output             1350 ml  Net           132.43 ml   Filed Weights   12/29/16 0447 12/30/16 0800  Weight: 195 lb (88.5 kg) 195 lb (88.5 kg)    Telemetry    Normal sinus rhythm with no significant arrhythmia - Personally Reviewed  ECG    Normal sinus rhythm with borderline anterior ST depression. - Personally Reviewed  Physical Exam   GEN: No acute distress.   Neck: No JVD Cardiac: RRR, no murmurs, rubs, or gallops.  Respiratory: Clear to auscultation bilaterally. GI: Soft,  nontender, non-distended  MS: No edema; No deformity. Neuro:  Nonfocal  Psych: Normal affect   Labs    Chemistry Recent Labs Lab 12/29/16 0444 12/30/16 0108  NA 135 135  K 4.3 3.6  CL 96* 101  CO2 30 27  GLUCOSE 102* 102*  BUN 14 15  CREATININE 0.96 0.88  CALCIUM 9.4 9.2  GFRNONAA >60 >60  GFRAA >60 >60  ANIONGAP 9 7     Hematology Recent Labs Lab 12/29/16 0444 12/30/16 0108  WBC 7.8 9.2  RBC 5.09 4.62  HGB 17.0 15.2  HCT 49.1 44.6  MCV 96.5 96.4  MCH 33.5 33.0  MCHC 34.7 34.2  RDW 14.5 14.5  PLT 322 278    Cardiac Enzymes Recent Labs Lab 12/29/16 0444 12/29/16 0859 12/29/16 1429 12/29/16 2024  TROPONINI 0.10* 0.60* 1.28* 1.65*   No results for input(s): TROPIPOC in the last 168 hours.   BNPNo results for input(s): BNP, PROBNP in the last 168 hours.   DDimer No results for input(s): DDIMER in the last 168 hours.   Radiology    Dg Chest 2 View  Result Date: 12/29/2016 CLINICAL DATA:  Chest pain this morning. EXAM: CHEST  2 VIEW COMPARISON:  11/20/2016 FINDINGS: The cardiomediastinal contours are normal. Small hiatal hernia. The lungs are clear. Pulmonary vasculature is normal. No consolidation, pleural effusion, or pneumothorax. No acute osseous abnormalities are seen. Degenerative change in the spine. IMPRESSION: No active cardiopulmonary disease. Electronically Signed   By: Rubye Oaks M.D.   On: 12/29/2016 05:17    Cardiac Studies   Echocardiogram was personally reviewed which showed normal LV systolic function and wall motion. No significant valvular abnormalities.  Patient Profile     56 y.o. male with past medical history of hypertension and tobacco use who presented with a small non-ST elevation myocardial infarction.  Assessment & Plan    1. Non-ST elevation myocardial infarction: Continue aspirin and heparin. The patient is scheduled for cardiac catheterization this morning. I discussed the procedure in details as well as risks and  benefits. Further recommendations to follow after cardiac cath.  2. Essential hypertension: Blood pressure is reasonably controlled.  3. Hyperlipidemia: Continue atorvastatin with a target LDL of less than 70.  4. Tobacco use: Currently on nicotine patch.   Signed,  Lorine Bears, MD  12/30/2016, 8:39 AM

## 2016-12-30 NOTE — Plan of Care (Signed)
Problem: Pain Managment: Goal: General experience of comfort will improve Outcome: Progressing No complaints of pain this shift will continue to monitor.  Problem: Tissue Perfusion: Goal: Risk factors for ineffective tissue perfusion will decrease Outcome: Progressing Heparin gtt infusing for VTE  Problem: Activity: Goal: Risk for activity intolerance will decrease Outcome: Completed/Met Date Met: 12/30/16 Up OOB independently tolerating well

## 2016-12-30 NOTE — Progress Notes (Signed)
ANTICOAGULATION CONSULT NOTE - Initial Consult  Pharmacy Consult for heparin Indication: chest pain/ACS  Allergies  Allergen Reactions  . Compazine [Prochlorperazine Edisylate] Other (See Comments)    seizure    Patient Measurements: Height: 5\' 9"  (175.3 cm) Weight: 195 lb (88.5 kg) IBW/kg (Calculated) : 70.7 Heparin Dosing Weight: 88.5 kg  Vital Signs: Temp: 98.1 F (36.7 C) (06/25 0436) Temp Source: Oral (06/25 0436) BP: 127/74 (06/25 0436) Pulse Rate: 66 (06/25 0436)  Labs:  Recent Labs  12/29/16 0444 12/29/16 0551 12/29/16 0859 12/29/16 1141 12/29/16 1429 12/29/16 1857 12/29/16 2024 12/30/16 0108  HGB 17.0  --   --   --   --   --   --  15.2  HCT 49.1  --   --   --   --   --   --  44.6  PLT 322  --   --   --   --   --   --  278  APTT  --  29  --   --   --   --   --   --   LABPROT  --  12.5  --   --   --   --   --   --   INR  --  0.93  --   --   --   --   --   --   HEPARINUNFRC  --   --   --  0.17*  --  0.38  --  0.48  CREATININE 0.96  --   --   --   --   --   --  0.88  TROPONINI 0.10*  --  0.60*  --  1.28*  --  1.65*  --     Estimated Creatinine Clearance: 103.1 mL/min (by C-G formula based on SCr of 0.88 mg/dL).   Medical History: Past Medical History:  Diagnosis Date  . GERD (gastroesophageal reflux disease)   . Hypercholesteremia   . Hypertension   . Sleep walking disorder     Medications:  Scheduled:  . aspirin EC  81 mg Oral Daily  . atorvastatin  40 mg Oral q1800  . lisinopril  40 mg Oral Daily  . metoprolol tartrate  25 mg Oral BID  . nicotine  21 mg Transdermal Daily  . pantoprazole  40 mg Oral Daily    Assessment: Patient admitted w/ burning CP and found to have trops of 0.10. Is being started on heparin drip  Goal of Therapy:  Heparin level 0.3-0.7 units/ml Monitor platelets by anticoagulation protocol: Yes   Plan:  Heparin level therapeutic at 0.38. Continue drip at 1450 units/hr and recheck in 6 hours.   6/25 @ 0100 HL  0.48 therapeutic x 2. Will continue current rate and will recheck next HL w/ am labs.  Thomasene Ripple, PharmD, BCPS Clinical Pharmacist 12/30/2016

## 2016-12-30 NOTE — Progress Notes (Signed)
[employer] [facility] -  at [facility]   PATIENT NAME: Dustin Velazquez    MR#:  [id number]  DATE OF BIRTH:  Sep 29, 1960  SUBJECTIVE:  CHIEF COMPLAINT:   Chief Complaint  Patient presents with  . Chest Pain     Patient came with chest tightness to the hospital and found to have some EKG changes and slightly elevated troponin, taken for Cath- stent placed on left Cx.  REVIEW OF SYSTEMS:  CONSTITUTIONAL: No fever, fatigue or weakness.  EYES: No blurred or double vision.  EARS, NOSE, AND THROAT: No tinnitus or ear pain.  RESPIRATORY: No cough, shortness of breath, wheezing or hemoptysis.  CARDIOVASCULAR: No chest pain, orthopnea, edema.  GASTROINTESTINAL: No nausea, vomiting, diarrhea or abdominal pain.  GENITOURINARY: No dysuria, hematuria.  ENDOCRINE: No polyuria, nocturia,  HEMATOLOGY: No anemia, easy bruising or bleeding SKIN: No rash or lesion. MUSCULOSKELETAL: No joint pain or arthritis.   NEUROLOGIC: No tingling, numbness, weakness.  PSYCHIATRY: No anxiety or depression.   ROS  DRUG ALLERGIES:   Allergies  Allergen Reactions  . Compazine [Prochlorperazine Edisylate] Other (See Comments)    seizure    VITALS:  Blood pressure (!) 151/84, pulse 75, temperature 98.7 F (37.1 C), temperature source Oral, resp. rate (!) 21, height 5\' 9"  (1.753 m), weight 88.5 kg (195 lb), SpO2 98 %.  PHYSICAL EXAMINATION:  GENERAL:  56 y.o.-year-old patient lying in the bed with no acute distress.  EYES: Pupils equal, round, reactive to light and accommodation. No scleral icterus. Extraocular muscles intact.  HEENT: Head atraumatic, normocephalic. Oropharynx and nasopharynx clear.  NECK:  Supple, no jugular venous distention. No thyroid enlargement, no tenderness.  LUNGS: Normal breath sounds bilaterally, no wheezing, rales,rhonchi or crepitation. No use of accessory muscles of respiration.  CARDIOVASCULAR: S1, S2 normal. No murmurs, rubs, or gallops.  ABDOMEN: Soft,  nontender, nondistended. Bowel sounds present. No organomegaly or mass.  EXTREMITIES: No pedal edema, cyanosis, or clubbing.  NEUROLOGIC: Cranial nerves II through XII are intact. Muscle strength 5/5 in all extremities. Sensation intact. Gait not checked.  PSYCHIATRIC: The patient is alert and oriented x 3.  SKIN: No obvious rash, lesion, or ulcer.   Physical Exam LABORATORY PANEL:   CBC  Recent Labs Lab 12/30/16 0108  WBC 9.2  HGB 15.2  HCT 44.6  PLT 278   ------------------------------------------------------------------------------------------------------------------  Chemistries   Recent Labs Lab 12/30/16 0108  NA 135  K 3.6  CL 101  CO2 27  GLUCOSE 102*  BUN 15  CREATININE 0.88  CALCIUM 9.2   ------------------------------------------------------------------------------------------------------------------  Cardiac Enzymes  Recent Labs Lab 12/29/16 1429 12/29/16 2024  TROPONINI 1.28* 1.65*   ------------------------------------------------------------------------------------------------------------------  RADIOLOGY:  Dg Chest 2 View  Result Date: 12/29/2016 CLINICAL DATA:  Chest pain this morning. EXAM: CHEST  2 VIEW COMPARISON:  11/20/2016 FINDINGS: The cardiomediastinal contours are normal. Small hiatal hernia. The lungs are clear. Pulmonary vasculature is normal. No consolidation, pleural effusion, or pneumothorax. No acute osseous abnormalities are seen. Degenerative change in the spine. IMPRESSION: No active cardiopulmonary disease. Electronically Signed   By: Rubye Oaks M.D.   On: 12/29/2016 05:17    ASSESSMENT AND PLAN:   Active Problems:   Non-STEMI (non-ST elevated myocardial infarction) (HCC)  * NSTEMI   IV heparin drip.   Added metoprolol, ASA, statin.   Checked lipid panel- LD 128 and hemoglobin A1c.   Check echocardiogram and cardiology consult is appreciated.   Cardiac catheterization done- left cx- stent placed,  *  Hypertension  Continue lisinopril, hold hydrochlorothiazide because of catheterization and added metoprolol.  * Smoking   Counseled to quit smoking for 4 minutes and offered nicotine patch.  * Hyperlipidemia   Atorvastatin.    All the records are reviewed and case discussed with Care Management/Social Workerr. Management plans discussed with the patient, family and they are in agreement.  CODE STATUS: Full code.  TOTAL TIME TAKING CARE OF THIS PATIENT: 35.minutes.   I tried calling his wife on the phone to discuss the plan and left a voicemail.  POSSIBLE D/C IN 1-2 DAYS, DEPENDING ON CLINICAL CONDITION.   Altamese Dilling M.D on 12/30/2016   Between 7am to 6pm - Pager - [phone]  After 6pm go to [email or web address] - password [email or web address]  [facility]  Office  [phone]  CC: Primary care physician; Gilles Chiquito, MD  Note: This dictation was prepared with Dragon dictation along with smaller phrase technology. Any transcriptional errors that result from this process are unintentional.

## 2016-12-30 NOTE — Progress Notes (Signed)
Patient report episode of sweating and shortness of breath that subsided rapidly. ECG repeated, Dr Kirke Corin notified. MD at bedside to talk with patient. No new orders. Will continue to monitor.

## 2016-12-31 DIAGNOSIS — Z720 Tobacco use: Secondary | ICD-10-CM

## 2016-12-31 DIAGNOSIS — I10 Essential (primary) hypertension: Secondary | ICD-10-CM

## 2016-12-31 LAB — CBC
HEMATOCRIT: 45.7 % (ref 40.0–52.0)
Hemoglobin: 15.8 g/dL (ref 13.0–18.0)
MCH: 33.4 pg (ref 26.0–34.0)
MCHC: 34.5 g/dL (ref 32.0–36.0)
MCV: 96.8 fL (ref 80.0–100.0)
Platelets: 291 10*3/uL (ref 150–440)
RBC: 4.72 MIL/uL (ref 4.40–5.90)
RDW: 14.8 % — AB (ref 11.5–14.5)
WBC: 8.6 10*3/uL (ref 3.8–10.6)

## 2016-12-31 LAB — BASIC METABOLIC PANEL
Anion gap: 6 (ref 5–15)
BUN: 8 mg/dL (ref 6–20)
CHLORIDE: 104 mmol/L (ref 101–111)
CO2: 26 mmol/L (ref 22–32)
CREATININE: 0.8 mg/dL (ref 0.61–1.24)
Calcium: 9.3 mg/dL (ref 8.9–10.3)
GFR calc Af Amer: >60 mL/min (ref >60)
GFR calc non Af Amer: >60 mL/min (ref >60)
GLUCOSE: 118 mg/dL — AB (ref 65–99)
POTASSIUM: 4 mmol/L (ref 3.5–5.1)
Sodium: 136 mmol/L (ref 135–145)

## 2016-12-31 MED ORDER — METOPROLOL TARTRATE 50 MG PO TABS: 50.0000 mg | ORAL_TABLET | Freq: Two times a day (BID) | ORAL | Status: DC

## 2016-12-31 MED ORDER — TICAGRELOR 90 MG PO TABS: 90.0000 mg | ORAL_TABLET | Freq: Two times a day (BID) | ORAL | 1 refills | Status: DC

## 2016-12-31 MED ORDER — NICOTINE 21 MG/24HR TD PT24
21.0000 mg | MEDICATED_PATCH | Freq: Every day | TRANSDERMAL | 0 refills | Status: DC
Start: 2017-01-01 — End: 2018-09-05

## 2016-12-31 MED ORDER — ATORVASTATIN CALCIUM 40 MG PO TABS: 40.0000 mg | ORAL_TABLET | Freq: Every day | ORAL | 1 refills | Status: DC

## 2016-12-31 MED ORDER — METOPROLOL TARTRATE 50 MG PO TABS: 50.0000 mg | ORAL_TABLET | Freq: Two times a day (BID) | ORAL | 1 refills | Status: DC

## 2016-12-31 NOTE — Discharge Summary (Signed)
[facility] - [place] at [facility]   PATIENT NAME: Dustin Velazquez    MR#:  [id number]  DATE OF BIRTH:  1961/05/31  DATE OF ADMISSION:  12/29/2016 ADMITTING PHYSICIAN: Ihor Austin, MD  DATE OF DISCHARGE: 12/31/2016  PRIMARY CARE PHYSICIAN: Gilles Chiquito, MD    ADMISSION DIAGNOSIS:  NSTEMI (non-ST elevated myocardial infarction) (HCC) [I21.4] Chest pain, unspecified type [R07.9]  DISCHARGE DIAGNOSIS:  Active Problems:   Non-STEMI (non-ST elevated myocardial infarction) (HCC)   SECONDARY DIAGNOSIS:   Past Medical History:  Diagnosis Date  . GERD (gastroesophageal reflux disease)   . Hypercholesteremia   . Hypertension   . Sleep walking disorder     HOSPITAL COURSE:   * NSTEMI   IV heparin drip.   Added metoprolol, ASA, statin.   Checked lipid panel- LD 128 and hemoglobin A1c.   Check echocardiogram and cardiology consult is appreciated.   Cardiac catheterization done- left cx- stent placed,  suggested Brilianta, Atorvastatin, metoprolol.   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 Pt is upset with that, but agreed.  * Hypertension   Continue lisinopril, hold hydrochlorothiazide because of catheterization and added metoprolol.  * Smoking   Counseled to quit smoking for 4 minutes and offered nicotine patch.  * Hyperlipidemia   Atorvastatin.  DISCHARGE CONDITIONS:   Stable.  CONSULTS OBTAINED:  Treatment Team:  Marinus Maw, MD  DRUG ALLERGIES:   Allergies  Allergen Reactions  . Compazine [Prochlorperazine Edisylate] Other (See Comments)    seizure    DISCHARGE MEDICATIONS:   Current Discharge Medication List    START taking these medications   Details  atorvastatin (LIPITOR) 40 MG tablet Take 1 tablet (40 mg total) by mouth daily at 6 PM. Qty: 30 tablet, Refills: 1    metoprolol tartrate (LOPRESSOR) 50 MG tablet  Take 1 tablet (50 mg total) by mouth 2 (two) times daily. Qty: 60 tablet, Refills: 1    nicotine (NICODERM CQ - DOSED IN MG/24 HOURS) 21 mg/24hr patch Place 1 patch (21 mg total) onto the skin daily. Qty: 28 patch, Refills: 0    ticagrelor (BRILINTA) 90 MG TABS tablet Take 1 tablet (90 mg total) by mouth 2 (two) times daily. Qty: 60 tablet, Refills: 1      CONTINUE these medications which have NOT CHANGED   Details  aspirin EC 81 MG tablet Take 81 mg by mouth daily.    lisinopril (PRINIVIL,ZESTRIL) 40 MG tablet Take 40 mg by mouth daily. Refills: 98    omeprazole (PRILOSEC) 20 MG capsule Take 20 mg by mouth daily. Refills: 2      STOP taking these medications     hydrochlorothiazide (HYDRODIURIL) 25 MG tablet          DISCHARGE INSTRUCTIONS:   Follow with Cardiology clinic in 1 week.  If you experience worsening of your admission symptoms, develop shortness of breath, life threatening emergency, suicidal or homicidal thoughts you must seek medical attention immediately by calling 911 or calling your MD immediately  if symptoms less severe.  You Must read complete instructions/literature along with all the possible adverse reactions/side effects for all the Medicines you take and that have been prescribed to you. Take any new Medicines after you have completely understood and accept all the possible adverse reactions/side effects.   Please note  You were cared for by a hospitalist during your hospital stay. If you  have any questions about your discharge medications or the care you received while you were in the hospital after you are discharged, you can call the unit and asked to speak with the hospitalist on call if the hospitalist that took care of you is not available. Once you are discharged, your primary care physician will handle any further medical issues. Please note that NO REFILLS for any discharge medications will be authorized once you are discharged, as it is  imperative that you return to your primary care physician (or establish a relationship with a primary care physician if you do not have one) for your aftercare needs so that they can reassess your need for medications and monitor your lab values.    Today   CHIEF COMPLAINT:   Chief Complaint  Patient presents with  . Chest Pain    HISTORY OF PRESENT ILLNESS:  Dustin Velazquez  is a 56 y.o. male with a known history of Gastroesophageal replace disease, hyperlipidemia, hypertension presented to the emergency room with chest pain since yesterday morning. Patient woke up yesterday morning with chest pain which is located in the retrosternal area. He felt it as heartburn and described it as 6 out of 10 on a scale of 1-10. Patient also woke up this morning with similar complaints. He was evaluated in the emergency room EKG showed normal sinus rhythm with no ST segment elevation. His first set of troponin is elevated. Patient was started on IV heparin drip and started on anticoagulation. He had stress test couple of years ago. Hospitalist service was consulted for further care of the patient.   VITAL SIGNS:  Blood pressure (!) 144/76, pulse 73, temperature 98.5 F (36.9 C), resp. rate 20, height 5\' 9"  (1.753 m), weight 88.5 kg (195 lb), SpO2 97 %.  I/O:   Intake/Output Summary (Last 24 hours) at 12/31/16 1204 Last data filed at 12/31/16 1021  Gross per 24 hour  Intake           1427.9 ml  Output              250 ml  Net           1177.9 ml    PHYSICAL EXAMINATION:   GENERAL:  56 y.o.-year-old patient lying in the bed with no acute distress.  EYES: Pupils equal, round, reactive to light and accommodation. No scleral icterus. Extraocular muscles intact.  HEENT: Head atraumatic, normocephalic. Oropharynx and nasopharynx clear.  NECK:  Supple, no jugular venous distention. No thyroid enlargement, no tenderness.  LUNGS: Normal breath sounds bilaterally, no wheezing, rales,rhonchi or  crepitation. No use of accessory muscles of respiration.  CARDIOVASCULAR: S1, S2 normal. No murmurs, rubs, or gallops.  ABDOMEN: Soft, nontender, nondistended. Bowel sounds present. No organomegaly or mass.  EXTREMITIES: No pedal edema, cyanosis, or clubbing.  NEUROLOGIC: Cranial nerves II through XII are intact. Muscle strength 5/5 in all extremities. Sensation intact. Gait not checked.  PSYCHIATRIC: The patient is alert and oriented x 3.  SKIN: No obvious rash, lesion, or ulcer.   DATA REVIEW:   CBC  Recent Labs Lab 12/31/16 0555  WBC 8.6  HGB 15.8  HCT 45.7  PLT 291    Chemistries   Recent Labs Lab 12/31/16 0555  NA 136  K 4.0  CL 104  CO2 26  GLUCOSE 118*  BUN 8  CREATININE 0.80  CALCIUM 9.3    Cardiac Enzymes  Recent Labs Lab 12/29/16 2024  TROPONINI 1.65*    Microbiology Results  No results found for this or any previous visit.  RADIOLOGY:  No results found.  EKG:   Orders placed or performed during the hospital encounter of 12/29/16  . EKG 12-Lead  . EKG 12-Lead  . ED EKG within 10 minutes  . ED EKG within 10 minutes  . Repeat EKG  . Repeat EKG  . EKG 12-Lead immediately post procedure  . EKG 12-Lead  . EKG 12-Lead immediately post procedure  . EKG 12-Lead      Management plans discussed with the patient, family and they are in agreement.  CODE STATUS:     Code Status Orders        Start     Ordered   12/29/16 0835  Full code  Continuous     12/29/16 0835    Code Status History    Date Active Date Inactive Code Status Order ID Comments User Context   This patient has a current code status but no historical code status.      TOTAL TIME TAKING CARE OF THIS PATIENT: 35 minutes.    Altamese Dilling M.D on 12/31/2016 at 12:04 PM  Between 7am to 6pm - Pager - [phone]  After 6pm go to [email or web address] - password [email or web address]  [facility]  Office  [phone]  CC: Primary care physician; Gilles Chiquito, MD   Note: This dictation was prepared with Dragon dictation along with smaller phrase technology. Any transcriptional errors that result from this process are unintentional.

## 2016-12-31 NOTE — Progress Notes (Signed)
Discharge instructions explained to pt/ verbalized an understanding/ iv and tele removed/ RX given pt/ transported off unit via wheelchair

## 2016-12-31 NOTE — Care Management Note (Signed)
Case Management Note  Patient Details  Name: Dustin Velazquez MRN: [id number] Date of Birth: [birth year]  Subjective/Objective:                  Met with patient prior to patient anticipated discharge today to discuss medication assistance need. [facility]. He is independent from home with his wife. He expresses the desire to change "his lifestyle habits including smoking and diet". He offered this to [facility] as he was concerned about cost of Brilinta indicating that he could control his health without Brilinta if it cost too much.   Action/Plan: RNCM advised patient to talk with Cardiologist (entered patient room as I was closing case). Brilinta coupon delivered to patient. No other RNCM needs.   Expected Discharge Date:                  Expected Discharge Plan:     In-House Referral:     Discharge planning Services  CM Consult, Medication Assistance  Post Acute Care Choice:    Choice offered to:  Patient  DME Arranged:    DME Agency:     HH Arranged:    [place] Agency:     Status of Service:  Completed, signed off  If discussed at [employer] of Stay Meetings, dates discussed:    Additional Comments:  Marshell Garfinkel, RN 12/31/2016, 7:58 AM

## 2016-12-31 NOTE — Plan of Care (Signed)
Problem: Pain Managment: Goal: General experience of comfort will improve Outcome: Completed/Met Date Met: 12/31/16 No complaints of pain, will continue to monitor.  Problem: Tissue Perfusion: Goal: Risk factors for ineffective tissue perfusion will decrease Outcome: Completed/Met Date Met: 12/31/16 Pt started on oral brinlinta.  Problem: Cardiovascular: Goal: Vascular access site(s) Level 0-1 will be maintained Outcome: Completed/Met Date Met: 12/31/16 Right radial site for cardiac cath at level 0 with no bleeding/bruising/pain. Will continue to monitor.

## 2016-12-31 NOTE — Plan of Care (Signed)
Problem: Food- and Nutrition-Related Knowledge Deficit (NB-1.1) Goal: Nutrition education Formal process to instruct or train a patient/client in a skill or to impart knowledge to help patients/clients voluntarily manage or modify food choices and eating behavior to maintain or improve health.  Outcome: Adequate for Discharge Nutrition Education Note  RD consulted for nutrition education regarding new onset CHF.  RD provided "Low Sodium Nutrition Therapy" handout from the Academy of [employer] Dietetics. Reviewed patient's dietary recall. Provided examples on ways to decrease sodium intake in diet. Discouraged intake of processed foods and use of salt shaker. Encouraged fresh fruits and vegetables as well as whole grain sources of carbohydrates to maximize fiber intake.   RD discussed why it is important for patient to adhere to diet recommendations, and emphasized the role of fluids, foods to avoid, and importance of weighing self daily. Teach back method used.  Expect fair compliance.  Body mass index is 28.8 kg/m. Pt meets criteria for overweight based on current BMI.  Current diet order is regular, patient is consuming approximately 100% of meals at this time. Labs and medications reviewed. No further nutrition interventions warranted at this time. RD contact information provided. If additional nutrition issues arise, please re-consult RD.   Dionne Ano. Anaiah Mcmannis, MS, RD LDN Inpatient Clinical Dietitian Pager [phone]  \

## 2016-12-31 NOTE — Progress Notes (Signed)
Patient Name: Dustin Velazquez Date of Encounter: 12/31/2016  Primary Cardiologist: new [facility] Problem List     Active Problems:   Non-STEMI (non-ST elevated myocardial infarction) (HCC)     Subjective   No further chest pain/burning. S/p PCI/DES to the LCx. Tolerating medications without issue. Ambulated without issue. Post-cath labs stable. BP 140s systolic. Has Brilinta card. LDL 128, now on statin. Troponin peaked at 1.65.   Inpatient Medications    Scheduled Meds: . aspirin EC  81 mg Oral Daily  . atorvastatin  40 mg Oral q1800  . lisinopril  40 mg Oral Daily  . metoprolol tartrate  25 mg Oral BID  . nicotine  21 mg Transdermal Daily  . pantoprazole  40 mg Oral Daily  . sodium chloride flush  3 mL Intravenous Q12H  . ticagrelor  90 mg Oral BID   Continuous Infusions: . sodium chloride Stopped (12/30/16 0947)  . sodium chloride     PRN Meds: sodium chloride, acetaminophen, nitroGLYCERIN, ondansetron (ZOFRAN) IV, sodium chloride flush   Vital Signs    Vitals:   12/30/16 1442 12/30/16 1834 12/30/16 1949 12/31/16 0545  BP: (!) 151/84 (!) 149/80 (!) 144/81 (!) 141/83  Pulse: 75 72 78 71  Resp:   18 18  Temp: 98.7 F (37.1 C) 98.5 F (36.9 C) 98.7 F (37.1 C) 98.2 F (36.8 C)  TempSrc: Oral Oral Oral Oral  SpO2: 98% 97% 97% 98%  Weight:      Height:        Intake/Output Summary (Last 24 hours) at 12/31/16 0956 Last data filed at 12/31/16 0226  Gross per 24 hour  Intake           1307.9 ml  Output              250 ml  Net           1057.9 ml   Filed Weights   12/29/16 0447 12/30/16 0800  Weight: 195 lb (88.5 kg) 195 lb (88.5 kg)    Physical Exam    GEN: Well nourished, well developed, in no acute distress.  HEENT: Grossly normal.  Neck: Supple, no JVD, carotid bruits, or masses. Cardiac: RRR, no murmurs, rubs, or gallops. No clubbing, cyanosis, edema.  Radials/DP/PT 2+ and equal bilaterally. Right radial cath site healing well without  bleeding, bruising, swelling, erythema, or TTP. Pulse 2+.  Respiratory:  Respirations regular and unlabored, clear to auscultation bilaterally. GI: Soft, nontender, nondistended, BS + x 4. MS: no deformity or atrophy. Skin: warm and dry, no rash. Neuro:  Strength and sensation are intact. Psych: AAOx3.  Normal affect.  Labs    CBC  Recent Labs  12/30/16 0108 12/31/16 0555  WBC 9.2 8.6  HGB 15.2 15.8  HCT 44.6 45.7  MCV 96.4 96.8  PLT 278 291   Basic Metabolic Panel  Recent Labs  12/30/16 0108 12/31/16 0555  NA 135 136  K 3.6 4.0  CL 101 104  CO2 27 26  GLUCOSE 102* 118*  BUN 15 8  CREATININE 0.88 0.80  CALCIUM 9.2 9.3   Liver Function Tests No results for input(s): AST, ALT, ALKPHOS, BILITOT, PROT, ALBUMIN in the last 72 hours. No results for input(s): LIPASE, AMYLASE in the last 72 hours. Cardiac Enzymes  Recent Labs  12/29/16 0859 12/29/16 1429 12/29/16 2024  TROPONINI 0.60* 1.28* 1.65*   BNP Invalid input(s): POCBNP D-Dimer No results for input(s): DDIMER in the last 72 hours. Hemoglobin  A1C No results for input(s): HGBA1C in the last 72 hours. Fasting Lipid Panel  Recent Labs  12/30/16 0108  CHOL 202*  HDL 49  LDLCALC 128*  TRIG 127  CHOLHDL 4.1   Thyroid Function Tests No results for input(s): TSH, T4TOTAL, T3FREE, THYROIDAB in the last 72 hours.  Invalid input(s): FREET3  Telemetry    NSR, PVCs - Personally Reviewed  ECG    n/a - Personally Reviewed  Radiology    No results found.  Cardiac Studies   Echo 12/29/2016: Study Conclusions  - Left ventricle: The cavity size was normal. Wall thickness was   normal. Systolic function was normal. The estimated ejection   fraction was in the range of 60% to 65%. Doppler parameters are   consistent with abnormal left ventricular relaxation (grade 1   diastolic dysfunction).  12/30/2016: Conclusion     The left ventricular systolic function is normal.  LV end diastolic  pressure is mildly elevated.  The left ventricular ejection fraction is 55-65% by visual estimate.  Mid RCA lesion, 40 %stenosed.  Mid LAD lesion, 30 %stenosed.  A drug eluting stent was successfully placed.  Mid Cx lesion, 95 %stenosed.  Post intervention, there is a 0% residual stenosis.   1. Severe one-vessel coronary artery disease with 95% thrombotic stenosis in the mid left circumflex with evidence of plaque rupture. Otherwise, mild LAD and RCA disease. 2. Normal LV systolic function and mildly elevated left ventricular end-diastolic pressure.  3. Successful angioplasty and drug-eluting stent placement to the mid left circumflex.  Recommendations: Dual antiplatelet therapy for at least one year, aggressive treatment of risk factors and smoking cessation. Possible discharge home tomorrow if no complications.     Patient Profile     56 y.o. male with history of hypertension and tobacco use who presented with a small non-ST elevation myocardial infarction.  Assessment & Plan    1. 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 He was advised no driving for at least the next 2 months (he is upset regarding this news). He was advised to discussed with supervisor -His post-intervention EF is > 40% -He will need to pass a stress test prior to being cleared by cardiology for driving -Cardiac rehab is advised  2. HTN: -Increase Lopressor to 50 mg bid -Lisinopril 40 mg daily  3. HLD: -Lipitor  4. Tobacco abuse: -Cessation advised  Signed, Eula Listen, PA-C [facility] Pager: [phone] 12/31/2016, 9:56 AM

## 2017-01-16 ENCOUNTER — Encounter: Payer: Self-pay | Admitting: Cardiovascular Disease

## 2017-01-16 ENCOUNTER — Ambulatory Visit: Payer: BLUE CROSS/BLUE SHIELD

## 2017-01-16 ENCOUNTER — Ambulatory Visit (INDEPENDENT_AMBULATORY_CARE_PROVIDER_SITE_OTHER): Payer: BLUE CROSS/BLUE SHIELD | Admitting: Cardiovascular Disease

## 2017-01-16 VITALS — BP 142/80 | HR 79 | Ht 69.0 in | Wt 195.2 lb

## 2017-01-16 DIAGNOSIS — E785 Hyperlipidemia, unspecified: Secondary | ICD-10-CM

## 2017-01-16 DIAGNOSIS — I10 Essential (primary) hypertension: Secondary | ICD-10-CM

## 2017-01-16 DIAGNOSIS — I2510 Atherosclerotic heart disease of native coronary artery without angina pectoris: Secondary | ICD-10-CM | POA: Diagnosis not present

## 2017-01-16 NOTE — Progress Notes (Signed)
Cardiology Office Note   Date:  01/16/2017   ID:  [name], DOB December 29, 1960, MRN [id number]  PCP:  Gilles Chiquito, MD  Cardiologist:   Lorine Bears, MD   Chief Complaint  Patient presents with  . OTHER    F/u cardiac cath c/o @ bedtime sob, elevated BP and discuss hctz 12.5 qd. Meds reviewed verbally with pt.      History of Present Illness: Dustin Velazquez is a 56 y.o. male who presents for Follow-up visit after recent hospitalization for non-ST elevation myocardial infarction and drug-eluting stent placement to the left circumflex. He has known history of hypertension, hyperlipidemia, GERD and previous tobacco use. He presented with substernal chest pain described as indigestion, chest pressure and burning sensation that did not respond to antacids. EKG showed subtle lateral ST depression. Troponin was mildly elevated consistent with non-ST elevation myocardial infarction. I proceeded with cardiac catheterization via the right radial artery which showed severe one-vessel coronary artery disease with 95% thrombotic stenosis in the mid left circumflex with mild LAD and RCA disease. Ejection fraction was normal. I performed successful angioplasty and drug-eluting stent placement to the left circumflex without complications. He has been doing well since then with no recurrent chest pain or shortness of breath. He has been taking his medications regularly. He quit smoking since his hospitalization.    Past Medical History:  Diagnosis Date  . GERD (gastroesophageal reflux disease)   . Hypercholesteremia   . Hypertension   . Sleep walking disorder     Past Surgical History:  Procedure Laterality Date  . APPENDECTOMY    . CORONARY STENT INTERVENTION N/A 12/30/2016   Procedure: Coronary Stent Intervention;  Surgeon: Iran Ouch, MD;  Location: [facility] INVASIVE CV LAB;  Service: Cardiovascular;  Laterality: N/A;  . KNEE SURGERY    . LEFT HEART CATH AND CORONARY  ANGIOGRAPHY N/A 12/30/2016   Procedure: Left Heart Cath and Coronary Angiography;  Surgeon: Iran Ouch, MD;  Location: [facility] INVASIVE CV LAB;  Service: Cardiovascular;  Laterality: N/A;     Current Outpatient Prescriptions  Medication Sig Dispense Refill  . aspirin EC 81 MG tablet Take 81 mg by mouth daily.    Dustin Velazquez atorvastatin (LIPITOR) 40 MG tablet Take 1 tablet (40 mg total) by mouth daily at 6 PM. 30 tablet 1  . lisinopril (PRINIVIL,ZESTRIL) 40 MG tablet Take 40 mg by mouth daily.  98  . metoprolol tartrate (LOPRESSOR) 50 MG tablet Take 1 tablet (50 mg total) by mouth 2 (two) times daily. 60 tablet 1  . nicotine (NICODERM CQ - DOSED IN MG/24 HOURS) 21 mg/24hr patch Place 1 patch (21 mg total) onto the skin daily. 28 patch 0  . omeprazole (PRILOSEC) 20 MG capsule Take 20 mg by mouth daily.  2  . ticagrelor (BRILINTA) 90 MG TABS tablet Take 1 tablet (90 mg total) by mouth 2 (two) times daily. 60 tablet 1   No current facility-administered medications for this visit.     Allergies:   Compazine [prochlorperazine edisylate]    Social History:  The patient  reports that he has been smoking Cigarettes.  He has a 30.00 pack-year smoking history. He has never used smokeless tobacco. He reports that he drinks alcohol. He reports that he does not use drugs.   Family History:  The patient's family history includes Hyperlipidemia in his father; Hypertension in his mother.    ROS:  Please see the history of present illness.   Otherwise,  review of systems are positive for none.   All other systems are reviewed and negative.    PHYSICAL EXAM: VS:  BP (!) 142/80 (BP Location: Left Arm, Patient Position: Sitting, Cuff Size: Normal)   Pulse 79   Ht 5\' 9"  (1.753 m)   Wt 195 lb 4 oz (88.6 kg)   BMI 28.83 kg/m  , BMI Body mass index is 28.83 kg/m. GEN: Well nourished, well developed, in no acute distress  HEENT: normal  Neck: no JVD, carotid bruits, or masses Cardiac: RRR; no murmurs, rubs,  or gallops,no edema  Respiratory:  clear to auscultation bilaterally, normal work of breathing GI: soft, nontender, nondistended, + BS MS: no deformity or atrophy  Skin: warm and dry, no rash Neuro:  Strength and sensation are intact Psych: euthymic mood, full affect Right radial pulse is normal with no hematoma.  EKG:  EKG is ordered today. The ekg ordered today demonstrates normal sinus rhythm with no significant ST or T wave changes.   Recent Labs: 11/20/2016: ALT 29; Magnesium 1.8 12/31/2016: BUN 8; Creatinine, Ser 0.80; Hemoglobin 15.8; Platelets 291; Potassium 4.0; Sodium 136    Lipid Panel    Component Value Date/Time   CHOL 202 (H) 12/30/2016 0108   TRIG 127 12/30/2016 0108   HDL 49 12/30/2016 0108   CHOLHDL 4.1 12/30/2016 0108   VLDL 25 12/30/2016 0108   LDLCALC 128 (H) 12/30/2016 0108      Wt Readings from Last 3 Encounters:  01/16/17 195 lb 4 oz (88.6 kg)  12/30/16 195 lb (88.5 kg)  11/20/16 194 lb (88 kg)       No flowsheet data found.    ASSESSMENT AND PLAN:  1.  Recent non-ST elevation myocardial infarction due to plaque rupture in the mid left circumflex which was treated successfully with angioplasty and drug-eluting stent placement. He is doing extremely well with no recurrent angina. Continue dual antiplatelet therapy for at least one year. He has mild occasional dyspnea at rest likely due to [facility] but the symptoms are not severe enough to switch the medication. 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 I'm going to obtain a treadmill stress test in 2 months. I strongly advised him to attend cardiac rehabilitation. He significantly improved his lifestyle.  2. Essential hypertension: Blood pressure is elevated. He used to be on hydrochlorothiazide and I agree with resuming it at 12.5 mg once daily.  3. Hyperlipidemia: He  was started on atorvastatin 40 mg daily. He is going to have labs done in August with his primary care physician. I recommend a target LDL of less than 70.  4. Previous tobacco use: I congratulated him on smoking cessation.    Disposition:   FU with me in 3 months  Signed,  Lorine Bears, MD  01/16/2017 3:11 PM    [facility]

## 2017-01-16 NOTE — Patient Instructions (Addendum)
Medication Instructions:  Your physician recommends that you continue on your current medications as directed. Please refer to the Current Medication list given to you today. You may start taking hydrochlorothiazide 12.5mg  once daily as prescribed by your PCP   Labwork: none  Testing/Procedures: Your physician has requested that you have an exercise tolerance test. For further information please visit [email or web address] Please also follow instruction sheet, as given.    Follow-Up: Your physician recommends that you schedule a follow-up appointment in: 3 months with Dr. Kirke Corin.    Any Other Special Instructions Will Be Listed Below (If Applicable).     If you need a refill on your cardiac medications before your next appointment, please call your pharmacy.   Exercise Stress Electrocardiogram An exercise stress electrocardiogram is a test that is done to evaluate the blood supply to your heart. This test may also be called exercise stress electrocardiography. The test is done while you are walking on a treadmill. The goal of this test is to raise your heart rate. This test is done to find areas of poor blood flow to the heart by determining the extent of coronary artery disease (CAD). CAD is defined as narrowing in one or more heart (coronary) arteries of more than 70%. If you have an abnormal test result, this may mean that you are not getting adequate blood flow to your heart during exercise. Additional testing may be needed to understand why your test was abnormal. Tell a health care provider about:  Any allergies you have.  All medicines you are taking, including vitamins, herbs, eye drops, creams, and over-the-counter medicines.  Any problems you or family members have had with anesthetic medicines.  Any blood disorders you have.  Any surgeries you have had.  Any medical conditions you have.  Possibility of pregnancy, if this applies. What are the risks? Generally,  this is a safe procedure. However, as with any procedure, complications can occur. Possible complications can include:  Pain or pressure in the following areas: ? Chest. ? Jaw or neck. ? Between your shoulder blades. ? Radiating down your left arm.  Dizziness or light-headedness.  Shortness of breath.  Increased or irregular heartbeats.  Nausea or vomiting.  Heart attack (rare).  What happens before the procedure?  Avoid all forms of caffeine 24 hours before your test or as directed by your health care provider. This includes coffee, tea (even decaffeinated tea), caffeinated sodas, chocolate, cocoa, and certain pain medicines.  Follow your health care provider's instructions regarding eating and drinking before the test.  Take your medicines as directed at regular times with water unless instructed otherwise. Exceptions may include: ? If you have diabetes, ask how you are to take your insulin or pills. It is common to adjust insulin dosing the morning of the test. ? If you are taking beta-blocker medicines, it is important to talk to your health care provider about these medicines well before the date of your test. Taking beta-blocker medicines may interfere with the test. In some cases, these medicines need to be changed or stopped 24 hours or more before the test. ? If you wear a nitroglycerin patch, it may need to be removed prior to the test. Ask your health care provider if the patch should be removed before the test.  If you use an inhaler for any breathing condition, bring it with you to the test.  If you are an outpatient, bring a snack so you can eat right after the stress phase of  the test.  Do not smoke for 4 hours prior to the test or as directed by your health care provider.  Do not apply lotions, powders, creams, or oils on your chest prior to the test.  Wear loose-fitting clothes and comfortable shoes for the test. This test involves walking on a treadmill. What  happens during the procedure?  Multiple patches (electrodes) will be put on your chest. If needed, small areas of your chest may have to be shaved to get better contact with the electrodes. Once the electrodes are attached to your body, multiple wires will be attached to the electrodes and your heart rate will be monitored.  Your heart will be monitored both at rest and while exercising.  You will walk on a treadmill. The treadmill will be started at a slow pace. The treadmill speed and incline will gradually be increased to raise your heart rate. What happens after the procedure?  Your heart rate and blood pressure will be monitored after the test.  You may return to your normal schedule including diet, activities, and medicines, unless your health care provider tells you otherwise. This information is not intended to replace advice given to you by your health care provider. Make sure you discuss any questions you have with your health care provider. Document Released: 06/21/2000 Document Revised: 11/30/2015 Document Reviewed: 03/01/2013 Elsevier Interactive Patient Education  2017 [employer].

## 2017-02-27 ENCOUNTER — Other Ambulatory Visit: Payer: Self-pay

## 2017-02-27 MED ORDER — ATORVASTATIN CALCIUM 40 MG PO TABS: 40.0000 mg | ORAL_TABLET | Freq: Every day | ORAL | 1 refills | Status: DC

## 2017-02-27 MED ORDER — TICAGRELOR 90 MG PO TABS: 90.0000 mg | ORAL_TABLET | Freq: Two times a day (BID) | ORAL | 1 refills | Status: DC

## 2017-02-27 MED ORDER — METOPROLOL TARTRATE 50 MG PO TABS: 50.0000 mg | ORAL_TABLET | Freq: Two times a day (BID) | ORAL | 1 refills | Status: DC

## 2017-02-27 NOTE — Telephone Encounter (Signed)
Pt will be out after tomorrow

## 2017-03-11 ENCOUNTER — Telehealth: Payer: Self-pay | Admitting: Cardiovascular Disease

## 2017-03-11 NOTE — Telephone Encounter (Signed)
Confirmed 9/5 GXT; reviewed instructions w/pt who verbalized understanding.

## 2017-03-12 ENCOUNTER — Ambulatory Visit (INDEPENDENT_AMBULATORY_CARE_PROVIDER_SITE_OTHER): Payer: BLUE CROSS/BLUE SHIELD

## 2017-03-12 DIAGNOSIS — I2510 Atherosclerotic heart disease of native coronary artery without angina pectoris: Secondary | ICD-10-CM

## 2017-03-13 LAB — EXERCISE TOLERANCE TEST
CHL CUP RESTING HR STRESS: 131 {beats}/min
CSEPED: 3 min
CSEPHR: 91 %
CSEPPHR: 150 {beats}/min
Estimated workload: 5.4 METS
Exercise duration (sec): 42 s
MPHR: 164 {beats}/min

## 2017-03-27 ENCOUNTER — Other Ambulatory Visit: Payer: Self-pay | Admitting: Cardiovascular Disease

## 2017-04-25 ENCOUNTER — Other Ambulatory Visit: Payer: Self-pay | Admitting: Cardiovascular Disease

## 2017-04-25 ENCOUNTER — Ambulatory Visit (INDEPENDENT_AMBULATORY_CARE_PROVIDER_SITE_OTHER): Payer: BLUE CROSS/BLUE SHIELD | Admitting: Cardiovascular Disease

## 2017-04-25 ENCOUNTER — Encounter: Payer: Self-pay | Admitting: Cardiovascular Disease

## 2017-04-25 VITALS — BP 124/70 | HR 82 | Ht 69.0 in | Wt 197.2 lb

## 2017-04-25 DIAGNOSIS — I2510 Atherosclerotic heart disease of native coronary artery without angina pectoris: Secondary | ICD-10-CM

## 2017-04-25 DIAGNOSIS — Z720 Tobacco use: Secondary | ICD-10-CM | POA: Diagnosis not present

## 2017-04-25 DIAGNOSIS — I10 Essential (primary) hypertension: Secondary | ICD-10-CM

## 2017-04-25 DIAGNOSIS — E785 Hyperlipidemia, unspecified: Secondary | ICD-10-CM

## 2017-04-25 NOTE — Patient Instructions (Signed)
Medication Instructions: Continue same medications.   Labwork: None.   Procedures/Testing: None.   Follow-Up: 6 months with Dr. Audyn Dimercurio.   Any Additional Special Instructions Will Be Listed Below (If Applicable).     If you need a refill on your cardiac medications before your next appointment, please call your pharmacy.   

## 2017-04-25 NOTE — Progress Notes (Signed)
Cardiology Office Note   Date:  04/25/2017   ID:  Dustin Velazquez Urwin, DOB 06/05/1961, MRN [id number]  PCP:  Gilles Chiquitoabinowitz, Joseph H, MD  Cardiologist:   Lorine BearsMuhammad Arida, MD   Chief Complaint  Patient presents with  . other    3 month f/u c/o sob at bedtime feels like he has to catch his breath we he is laying down and lightheaded. Meds reviewed verbally with pt.      History of Present Illness: Dustin Velazquez Tschida is a 56 y.o. male who presents for a follow-up visit regarding coronary artery disease.  He had non-ST elevation myocardial infarction in June of this year.  Cardiac catheterization showed severe one-vessel coronary artery disease with 95% robotic stenosis in the mid left circumflex with mild LAD and RCA disease.  Ejection fraction was normal.  He underwent successful angioplasty and drug-eluting stent placement to the left circumflex.  He has known history of hypertension, hyperlipidemia, GERD and  tobacco use. He has been doing very well and denies any chest pain.  He does describe intermittent episodes of shortness of breath mostly at night when he is trying to sleep.  He feels the need to take a deep breath.  He also has mild orthostatic dizziness. He cut down on smoking but has not been able to quit completely.   Past Medical History:  Diagnosis Date  . GERD (gastroesophageal reflux disease)   . Hypercholesteremia   . Hypertension   . Sleep walking disorder     Past Surgical History:  Procedure Laterality Date  . APPENDECTOMY    . CORONARY STENT INTERVENTION N/A 12/30/2016   Procedure: Coronary Stent Intervention;  Surgeon: Iran OuchArida, Muhammad A, MD;  Location: [facility];  Service: Cardiovascular;  Laterality: N/A;  . KNEE SURGERY    . LEFT HEART CATH AND CORONARY ANGIOGRAPHY N/A 12/30/2016   Procedure: Left Heart Cath and Coronary Angiography;  Surgeon: Iran OuchArida, Muhammad A, MD;  Location: [facility];  Service: Cardiovascular;  Laterality: N/A;      Current Outpatient Prescriptions  Medication Sig Dispense Refill  . aspirin EC 81 MG tablet Take 81 mg by mouth daily.    Marland Kitchen. atorvastatin (LIPITOR) 40 MG tablet TAKE 1 TABLET BY MOUTH DAILY AT 6PM 30 tablet 0  . hydrochlorothiazide (MICROZIDE) 12.5 MG capsule Take 12.5 mg by mouth daily.    Marland Kitchen. lisinopril (PRINIVIL,ZESTRIL) 40 MG tablet Take 40 mg by mouth daily.  98  . metoprolol tartrate (LOPRESSOR) 50 MG tablet TAKE 1 TABLET BY MOUTH TWICE DAILY 60 tablet 0  . nicotine (NICODERM CQ - DOSED IN MG/24 HOURS) 21 mg/24hr patch Place 1 patch (21 mg total) onto the skin daily. 28 patch 0  . omeprazole (PRILOSEC) 20 MG capsule Take 20 mg by mouth daily.  2  . ticagrelor (BRILINTA) 90 MG TABS tablet Take 1 tablet (90 mg total) by mouth 2 (two) times daily. 60 tablet 1   No current facility-administered medications for this visit.     Allergies:   Compazine [prochlorperazine edisylate]    Social History:  The patient  reports that he has been smoking Cigarettes.  He has a 7.50 pack-year smoking history. He has never used smokeless tobacco. He reports that he drinks alcohol. He reports that he does not use drugs.   Family History:  The patient's family history includes Hyperlipidemia in his father; Hypertension in his mother.    ROS:  Please see the history of present illness.  Otherwise, review of systems are positive for none.   All other systems are reviewed and negative.    PHYSICAL EXAM: VS:  BP 124/70 (BP Location: Left Arm, Patient Position: Sitting, Cuff Size: Normal)   Pulse 82   Ht 5\' 9"  (1.753 m)   Wt 197 lb 4 oz (89.5 kg)   BMI 29.13 kg/m  , BMI Body mass index is 29.13 kg/m. GEN: Well nourished, well developed, in no acute distress  HEENT: normal  Neck: no JVD, carotid bruits, or masses Cardiac: RRR; no murmurs, rubs, or gallops,no edema  Respiratory:  clear to auscultation bilaterally, normal work of breathing GI: soft, nontender, nondistended, + BS MS: no deformity  or atrophy  Skin: warm and dry, no rash Neuro:  Strength and sensation are intact Psych: euthymic mood, full affect  EKG:  EKG is ordered today. The ekg ordered today demonstrates normal sinus rhythm with no significant ST or T wave changes.   Recent Labs: 11/20/2016: ALT 29; Magnesium 1.8 12/31/2016: BUN 8; Creatinine, Ser 0.80; Hemoglobin 15.8; Platelets 291; Potassium 4.0; Sodium 136    Lipid Panel    Component Value Date/Time   CHOL 202 (H) 12/30/2016 0108   TRIG 127 12/30/2016 0108   HDL 49 12/30/2016 0108   CHOLHDL 4.1 12/30/2016 0108   VLDL 25 12/30/2016 0108   LDLCALC 128 (H) 12/30/2016 0108      Wt Readings from Last 3 Encounters:  04/25/17 197 lb 4 oz (89.5 kg)  01/16/17 195 lb 4 oz (88.6 kg)  12/30/16 195 lb (88.5 kg)       No flowsheet data found.    ASSESSMENT AND PLAN:  1.   Coronary artery disease involving native coronary arteries without angina: Very well overall.  I suspect that his shortness of breath at night is likely due to treatment with Brilinta.  However, the symptoms have been mild overall.  I recommend continuing dual antiplatelet therapy at least until June 2018.  2. Essential hypertension: Blood pressure is well controlled on current medications.  He does complain of mild orthostatic dizziness could be due to multiple antihypertensive medications.  However, his blood pressure is under optimal control at the present time.  3. Hyperlipidemia: Continue treatment with atorvastatin with a target LDL of less than 70.  He had his labs done with his primary care physician.  4. Tobacco use: He has not been able to quit completely and I discussed with him the importance of complete cessation.    Disposition:   FU with me in 6 months  Signed,  Lorine Bears, MD  04/25/2017 2:00 PM    [facility]

## 2017-04-28 ENCOUNTER — Other Ambulatory Visit: Payer: Self-pay | Admitting: Cardiovascular Disease

## 2017-08-20 ENCOUNTER — Other Ambulatory Visit: Payer: Self-pay | Admitting: Cardiovascular Disease

## 2017-11-12 ENCOUNTER — Other Ambulatory Visit: Payer: Self-pay | Admitting: Cardiovascular Disease

## 2017-11-20 ENCOUNTER — Other Ambulatory Visit: Payer: Self-pay | Admitting: Cardiovascular Disease

## 2017-12-11 ENCOUNTER — Other Ambulatory Visit: Payer: Self-pay | Admitting: Cardiovascular Disease

## 2017-12-19 ENCOUNTER — Other Ambulatory Visit: Payer: Self-pay | Admitting: Cardiovascular Disease

## 2017-12-25 ENCOUNTER — Other Ambulatory Visit: Payer: Self-pay | Admitting: Cardiovascular Disease

## 2017-12-25 ENCOUNTER — Telehealth: Payer: Self-pay | Admitting: Cardiovascular Disease

## 2017-12-25 NOTE — Telephone Encounter (Signed)
°*  STAT* If patient is at the pharmacy, call can be transferred to refill team.   1. Which medications need to be refilled? (please list name of each medication and dose if known)     Brilinta 90 mg po BID 2. Which pharmacy/location (including street and city if local pharmacy) is medication to be sent to ?      [facility] [address] [place]   3. Do they need a 30 day or 90 day supply? 90  PATIENT IS OUT OF MEDS

## 2017-12-25 NOTE — Telephone Encounter (Signed)
BRILINTA 90 MG TABS tablet 180 tablet 0 12/25/2017    Sig: TAKE ONE TABLET BY MOUTH TWICE DAILY   Sent to pharmacy as: BRILINTA 90 MG Tab tablet   E-Prescribing Status: Receipt confirmed by pharmacy (12/25/2017 12:34 PM EDT)   Pharmacy   [facility] - [place], [place] - [address]

## 2018-01-10 ENCOUNTER — Other Ambulatory Visit: Payer: Self-pay | Admitting: Cardiovascular Disease

## 2018-01-20 ENCOUNTER — Other Ambulatory Visit: Payer: Self-pay | Admitting: Cardiovascular Disease

## 2018-02-13 ENCOUNTER — Other Ambulatory Visit: Payer: Self-pay | Admitting: Cardiovascular Disease

## 2018-03-05 ENCOUNTER — Encounter: Payer: Self-pay | Admitting: Cardiovascular Disease

## 2018-03-05 ENCOUNTER — Ambulatory Visit (INDEPENDENT_AMBULATORY_CARE_PROVIDER_SITE_OTHER): Payer: BLUE CROSS/BLUE SHIELD | Admitting: Cardiovascular Disease

## 2018-03-05 VITALS — BP 140/72 | HR 76 | Ht 69.0 in | Wt 203.5 lb

## 2018-03-05 DIAGNOSIS — Z720 Tobacco use: Secondary | ICD-10-CM | POA: Diagnosis not present

## 2018-03-05 DIAGNOSIS — E785 Hyperlipidemia, unspecified: Secondary | ICD-10-CM

## 2018-03-05 DIAGNOSIS — I2510 Atherosclerotic heart disease of native coronary artery without angina pectoris: Secondary | ICD-10-CM

## 2018-03-05 DIAGNOSIS — I10 Essential (primary) hypertension: Secondary | ICD-10-CM

## 2018-03-05 MED ORDER — TICAGRELOR 60 MG PO TABS: 60.0000 mg | ORAL_TABLET | Freq: Two times a day (BID) | ORAL | 3 refills | Status: DC

## 2018-03-05 MED ORDER — ROSUVASTATIN CALCIUM 40 MG PO TABS: 40.0000 mg | ORAL_TABLET | Freq: Every day | ORAL | 3 refills | Status: DC

## 2018-03-05 NOTE — Progress Notes (Signed)
Cardiology Office Note   Date:  03/05/2018   ID:  Dustin Velazquez, DOB [birth year], MRN [id number]  PCP:  Gilles Chiquito, MD  Cardiologist:   Lorine Bears, MD   Chief Complaint  Patient presents with  . other    6 month follow up. Meds reviewed by the pt. verbally. "doing well." Pt. c/o having symptoms of an anxiety attack but thought was having an MI, the EMS came to check him out with the EKG being normal.       History of Present Illness: Dustin Velazquez is a 57 y.o. male who presents for a follow-up visit regarding coronary artery disease.  He had non-ST elevation myocardial infarction in June of 2018.  Cardiac catheterization showed severe one-vessel coronary artery disease with 95% thrombotic stenosis in the mid left circumflex with mild LAD and RCA disease.  Ejection fraction was normal.  He underwent successful angioplasty and drug-eluting stent placement to the left circumflex.  He has known history of hypertension, hyperlipidemia, GERD and  tobacco use.  He has been doing well overall with no recent chest pain.  He did have an episode few months ago where he felt weak and then became very anxious with palpitations.  He called EMS.  His EKG showed sinus tachycardia with no significant ST changes.  He had no chest pain at that time and he ultimately felt that the episode was due to a panic attack.  He is doing well now.  Past Medical History:  Diagnosis Date  . GERD (gastroesophageal reflux disease)   . Hypercholesteremia   . Hypertension   . Sleep walking disorder     Past Surgical History:  Procedure Laterality Date  . APPENDECTOMY    . CORONARY STENT INTERVENTION N/A 12/30/2016   Procedure: Coronary Stent Intervention;  Surgeon: Iran Ouch, MD;  Location: [facility];  Service: Cardiovascular;  Laterality: N/A;  . KNEE SURGERY    . LEFT HEART CATH AND CORONARY ANGIOGRAPHY N/A 12/30/2016   Procedure: Left Heart Cath and Coronary Angiography;   Surgeon: Iran Ouch, MD;  Location: [facility];  Service: Cardiovascular;  Laterality: N/A;     Current Outpatient Medications  Medication Sig Dispense Refill  . aspirin EC 81 MG tablet Take 81 mg by mouth daily.    Marland Kitchen atorvastatin (LIPITOR) 40 MG tablet TAKE ONE TABLET BY MOUTH DAILY AT 6PM 30 tablet 0  . BRILINTA 90 MG TABS tablet TAKE ONE TABLET BY MOUTH TWICE DAILY 180 tablet 0  . hydrochlorothiazide (MICROZIDE) 12.5 MG capsule Take 12.5 mg by mouth daily.    Marland Kitchen lisinopril (PRINIVIL,ZESTRIL) 40 MG tablet Take 40 mg by mouth daily.  98  . metoprolol tartrate (LOPRESSOR) 50 MG tablet TAKE ONE TABLET BY MOUTH TWICE DAILY 60 tablet 3  . nicotine (NICODERM CQ - DOSED IN MG/24 HOURS) 21 mg/24hr patch Place 1 patch (21 mg total) onto the skin daily. 28 patch 0  . omeprazole (PRILOSEC) 20 MG capsule Take 20 mg by mouth daily.  2   No current facility-administered medications for this visit.     Allergies:   Compazine [prochlorperazine edisylate]    Social History:  The patient  reports that he has been smoking cigarettes. He has a 7.50 pack-year smoking history. He has never used smokeless tobacco. He reports that he drinks alcohol. He reports that he does not use drugs.   Family History:  The patient's family history includes Hyperlipidemia in his  father; Hypertension in his mother.    ROS:  Please see the history of present illness.   Otherwise, review of systems are positive for none.   All other systems are reviewed and negative.    PHYSICAL EXAM: VS:  BP 140/72 (BP Location: Left Arm, Patient Position: Sitting, Cuff Size: Normal)   Pulse 76   Ht 5\' 9"  (1.753 m)   Wt 203 lb 8 oz (92.3 kg)   BMI 30.05 kg/m  , BMI Body mass index is 30.05 kg/m. GEN: Well nourished, well developed, in no acute distress  HEENT: normal  Neck: no JVD, carotid bruits, or masses Cardiac: RRR; no murmurs, rubs, or gallops,no edema  Respiratory:  clear to auscultation bilaterally, normal  work of breathing GI: soft, nontender, nondistended, + BS MS: no deformity or atrophy  Skin: warm and dry, no rash Neuro:  Strength and sensation are intact Psych: euthymic mood, full affect  EKG:  EKG is ordered today. The ekg ordered today demonstrates normal sinus rhythm with no significant ST or T wave changes.   Recent Labs: No results found for requested labs within last 8760 hours.    Lipid Panel    Component Value Date/Time   CHOL 202 (H) 12/30/2016 0108   TRIG 127 12/30/2016 0108   HDL 49 12/30/2016 0108   CHOLHDL 4.1 12/30/2016 0108   VLDL 25 12/30/2016 0108   LDLCALC 128 (H) 12/30/2016 0108      Wt Readings from Last 3 Encounters:  03/05/18 203 lb 8 oz (92.3 kg)  04/25/17 197 lb 4 oz (89.5 kg)  01/16/17 195 lb 4 oz (88.6 kg)       No flowsheet data found.    ASSESSMENT AND PLAN:  1.   Coronary artery disease involving native coronary arteries without angina: He is doing very well with no anginal symptoms.  I decrease Brilinta to 60 mg twice daily.  I am planning to use that for another year and then stop.    2. Essential hypertension: Blood pressure is mildly elevated.  I made no changes in his medications today.  Continue metoprolol and lisinopril.  He did have orthostatic dizziness in the past and thus I am hesitant to increase hydrochlorothiazide.    3. Hyperlipidemia: Continue treatment with atorvastatin with a target LDL of less than 70.  I reviewed his most recent labs in February which showed an LDL of 102 which is not a target.  I elected to switch him from atorvastatin to rosuvastatin 40 mg daily.  If LDL remains above 70, I am planning to add Zetia.  I discussed with him the importance of healthy diet and exercise.  4. Tobacco use: I again discussed with him the importance of smoking cessation.  He has been trying to quit but he reports significant anxiety associated with this.   Disposition:   FU with me in 12 months  Signed,  Lorine Bears, MD  03/05/2018 8:46 AM    [facility]

## 2018-03-05 NOTE — Patient Instructions (Signed)
Medication Instructions: DECREASE the Brilinta to 60 mg twice daily STOP the Atorvastatin START Rosuvastatin 40 mg daily  If you need a refill on your cardiac medications before your next appointment, please call your pharmacy.   Labwork: Your provider would like for you to return in 2 months to have the following labs drawn: FASTING lipid and liver.Please go to the [facility] entrance and check in at the front desk. You do not need an appointment.   Follow-Up: Your physician wants you to follow-up in 12 months with Dr. Kirke Corin. You will receive a reminder letter in the mail two months in advance. If you don't receive a letter, please call our office at [phone] to schedule this follow-up appointment.  Thank you for choosing [facility] at [facility]!

## 2018-03-19 ENCOUNTER — Other Ambulatory Visit: Payer: Self-pay | Admitting: Cardiovascular Disease

## 2018-04-18 ENCOUNTER — Other Ambulatory Visit: Payer: Self-pay | Admitting: Cardiovascular Disease

## 2018-06-19 ENCOUNTER — Other Ambulatory Visit: Payer: Self-pay | Admitting: Cardiovascular Disease

## 2018-08-25 ENCOUNTER — Other Ambulatory Visit: Payer: Self-pay | Admitting: Internal Medicine

## 2018-08-25 DIAGNOSIS — E0590 Thyrotoxicosis, unspecified without thyrotoxic crisis or storm: Secondary | ICD-10-CM

## 2018-08-25 DIAGNOSIS — E038 Other specified hypothyroidism: Secondary | ICD-10-CM

## 2018-08-31 ENCOUNTER — Ambulatory Visit
Admission: RE | Admit: 2018-08-31 | Discharge: 2018-08-31 | Disposition: A | Discharge status: Home / Self Care | Payer: BLUE CROSS/BLUE SHIELD | Source: Ambulatory Visit | Attending: Internal Medicine | Admitting: Internal Medicine

## 2018-08-31 DIAGNOSIS — E038 Other specified hypothyroidism: Secondary | ICD-10-CM | POA: Diagnosis not present

## 2018-08-31 DIAGNOSIS — E0590 Thyrotoxicosis, unspecified without thyrotoxic crisis or storm: Secondary | ICD-10-CM | POA: Diagnosis not present

## 2018-09-05 ENCOUNTER — Other Ambulatory Visit: Payer: Self-pay

## 2018-09-05 ENCOUNTER — Emergency Department
Admission: EM | Admit: 2018-09-05 | Discharge: 2018-09-05 | Disposition: A | Discharge status: Home / Self Care | Payer: BLUE CROSS/BLUE SHIELD | Attending: Emergency Medicine | Admitting: Emergency Medicine

## 2018-09-05 ENCOUNTER — Encounter: Payer: Self-pay | Admitting: Emergency Medicine

## 2018-09-05 DIAGNOSIS — R55 Syncope and collapse: Secondary | ICD-10-CM

## 2018-09-05 DIAGNOSIS — Z7982 Long term (current) use of aspirin: Secondary | ICD-10-CM | POA: Diagnosis not present

## 2018-09-05 DIAGNOSIS — I10 Essential (primary) hypertension: Secondary | ICD-10-CM | POA: Diagnosis not present

## 2018-09-05 DIAGNOSIS — F17210 Nicotine dependence, cigarettes, uncomplicated: Secondary | ICD-10-CM | POA: Diagnosis not present

## 2018-09-05 DIAGNOSIS — Z79899 Other long term (current) drug therapy: Secondary | ICD-10-CM | POA: Insufficient documentation

## 2018-09-05 DIAGNOSIS — R42 Dizziness and giddiness: Secondary | ICD-10-CM | POA: Diagnosis not present

## 2018-09-05 DIAGNOSIS — R531 Weakness: Secondary | ICD-10-CM | POA: Diagnosis not present

## 2018-09-05 DIAGNOSIS — I252 Old myocardial infarction: Secondary | ICD-10-CM | POA: Diagnosis not present

## 2018-09-05 DIAGNOSIS — E860 Dehydration: Secondary | ICD-10-CM | POA: Diagnosis not present

## 2018-09-05 LAB — URINALYSIS, COMPLETE (UACMP) WITH MICROSCOPIC
Bilirubin Urine: NEGATIVE
Glucose, UA: NEGATIVE mg/dL
HGB URINE DIPSTICK: NEGATIVE
Ketones, ur: NEGATIVE mg/dL
Leukocytes,Ua: NEGATIVE
Nitrite: NEGATIVE
PROTEIN: NEGATIVE mg/dL
Specific Gravity, Urine: 1.004 — ABNORMAL LOW (ref 1.005–1.030)
Squamous Epithelial / LPF: NONE SEEN (ref 0–5)
pH: 5 (ref 5.0–8.0)

## 2018-09-05 LAB — TROPONIN I
Troponin I: <0.03 ng/mL (ref <0.03)
Troponin I: <0.03 ng/mL (ref <0.03)

## 2018-09-05 LAB — CBC
HEMATOCRIT: 39.9 % (ref 39.0–52.0)
HEMOGLOBIN: 13.3 g/dL (ref 13.0–17.0)
MCH: 32.4 pg (ref 26.0–34.0)
MCHC: 33.3 g/dL (ref 30.0–36.0)
MCV: 97.3 fL (ref 80.0–100.0)
Platelets: 295 10*3/uL (ref 150–400)
RBC: 4.1 MIL/uL — ABNORMAL LOW (ref 4.22–5.81)
RDW: 14.5 % (ref 11.5–15.5)
WBC: 9.9 10*3/uL (ref 4.0–10.5)
nRBC: 0 % (ref 0.0–0.2)

## 2018-09-05 LAB — BASIC METABOLIC PANEL
Anion gap: 13 (ref 5–15)
BUN: 16 mg/dL (ref 6–20)
CO2: 22 mmol/L (ref 22–32)
Calcium: 8.6 mg/dL — ABNORMAL LOW (ref 8.9–10.3)
Chloride: 97 mmol/L — ABNORMAL LOW (ref 98–111)
Creatinine, Ser: 1.02 mg/dL (ref 0.61–1.24)
GFR calc Af Amer: >60 mL/min (ref >60)
GFR calc non Af Amer: >60 mL/min (ref >60)
Glucose, Bld: 83 mg/dL (ref 70–99)
POTASSIUM: 3.4 mmol/L — AB (ref 3.5–5.1)
Sodium: 132 mmol/L — ABNORMAL LOW (ref 135–145)

## 2018-09-05 MED ORDER — SODIUM CHLORIDE 0.9 % IV BOLUS
500.0000 mL | Freq: Once | INTRAVENOUS | Status: AC
  Administered 2018-09-05: 500 mL via INTRAVENOUS

## 2018-09-05 MED ORDER — SODIUM CHLORIDE 0.9% FLUSH: 3.0000 mL | Freq: Once | INTRAVENOUS | Status: DC

## 2018-09-05 MED ORDER — SODIUM CHLORIDE 0.9 % IV BOLUS
1000.0000 mL | Freq: Once | INTRAVENOUS | Status: AC
  Administered 2018-09-05: 1000 mL via INTRAVENOUS

## 2018-09-05 NOTE — ED Notes (Signed)
Pt reports that earlier today he had an episode of dizziness and feeling like he was going to pass out - he reports that at this time he feels lightheaded - BP is 90's/60's which he reports is low for him - he is now receiving NACL Bolus - denies chest pain or shortness of breath - at this time NAD

## 2018-09-05 NOTE — Discharge Instructions (Addendum)
Please follow-up with your doctor for recheck/reevaluation.  Please drink plenty of fluids.  Return to the emergency department for any further episodes of lightheadedness, near syncope, chest pain, or any other symptom personally concerning to yourself.

## 2018-09-05 NOTE — ED Provider Notes (Signed)
[facility] Emergency Department Provider Note  Time seen: 6:43 PM  I have reviewed the triage vital signs and the nursing notes.   HISTORY  Chief Complaint No chief complaint on file.    HPI Dustin Velazquez is a 58 y.o. male the past medical history of gastric reflux, hypertension, hyperlipidemia, prior end STEMI with coronary stent presents to the emergency department after a near syncopal event.  According to the patient he was getting his haircut by his wife, when he began feeling an upset stomach like he needed to have a bowel movement.  Patient states he tried to get to the toilet but began feeling very weak and lightheaded became very pale and sweaty per wife and did not make it to the toilet before having a bowel movement.  Patient states he lied down on the floor continued to feel very weak and lightheaded however after few minutes the patient began feeling much better.  States he is gotten lightheaded in the past but never like this.  Symptoms occurred around 3 to 3:30 PM today.  Currently patient denies any symptoms.  States he feels back to normal.  Does state him and his wife are currently going through a separation and he has not been eating or drinking as much recently.  Denies any chest pain.   Past Medical History:  Diagnosis Date  . GERD (gastroesophageal reflux disease)   . Hypercholesteremia   . Hypertension   . Sleep walking disorder     Patient Active Problem List   Diagnosis Date Noted  . Non-STEMI (non-ST elevated myocardial infarction) (HCC) 12/29/2016    Past Surgical History:  Procedure Laterality Date  . APPENDECTOMY    . CORONARY STENT INTERVENTION N/A 12/30/2016   Procedure: Coronary Stent Intervention;  Surgeon: Iran Ouch, MD;  Location: [facility] INVASIVE CV LAB;  Service: Cardiovascular;  Laterality: N/A;  . KNEE SURGERY    . LEFT HEART CATH AND CORONARY ANGIOGRAPHY N/A 12/30/2016   Procedure: Left Heart Cath and Coronary  Angiography;  Surgeon: Iran Ouch, MD;  Location: [facility] INVASIVE CV LAB;  Service: Cardiovascular;  Laterality: N/A;    Prior to Admission medications   Medication Sig Start Date End Date Taking? Authorizing Provider  aspirin EC 81 MG tablet Take 81 mg by mouth daily.    [provider]  atorvastatin (LIPITOR) 40 MG tablet TAKE ONE TABLET EVERY DAY AT 6PM 04/20/18   Iran Ouch, MD  hydrochlorothiazide (MICROZIDE) 12.5 MG capsule Take 12.5 mg by mouth daily.    [provider]  lisinopril (PRINIVIL,ZESTRIL) 40 MG tablet Take 40 mg by mouth daily. 11/07/16   [provider]  metoprolol tartrate (LOPRESSOR) 50 MG tablet TAKE ONE TABLET BY MOUTH TWICE DAILY 06/19/18   Iran Ouch, MD  nicotine (NICODERM CQ - DOSED IN MG/24 HOURS) 21 mg/24hr patch Place 1 patch (21 mg total) onto the skin daily. 01/01/17   Altamese Dilling, MD  omeprazole (PRILOSEC) 20 MG capsule Take 20 mg by mouth daily. 11/19/16   [provider]  rosuvastatin (CRESTOR) 40 MG tablet Take 1 tablet (40 mg total) by mouth daily. 03/05/18 06/03/18  Iran Ouch, MD  ticagrelor (BRILINTA) 60 MG TABS tablet Take 1 tablet (60 mg total) by mouth 2 (two) times daily. 03/05/18   Iran Ouch, MD    Allergies  Allergen Reactions  . Compazine [Prochlorperazine Edisylate] Other (See Comments)    seizure    Family History  Problem Relation Age of Onset  . Hypertension Mother   . Hyperlipidemia Father     Social History Social History   Tobacco Use  . Smoking status: Current Every Day Smoker    Packs/day: 0.25    Years: 30.00    Pack years: 7.50    Types: Cigarettes  . Smokeless tobacco: Never Used  Substance Use Topics  . Alcohol use: Yes  . Drug use: No    Review of Systems Constitutional: Negative for fever.  Positive for near syncopal event. Cardiovascular: Negative for chest pain. Respiratory: Negative for shortness of breath. Gastrointestinal:  Negative for abdominal pain, vomiting  Musculoskeletal: Negative for musculoskeletal complaints Skin: Negative for skin complaints  Neurological: Negative for headache All other ROS negative  ____________________________________________   PHYSICAL EXAM:  VITAL SIGNS: ED Triage Vitals  Enc Vitals Group     BP 09/05/18 1623 (!) 86/51     Pulse Rate 09/05/18 1623 72     Resp 09/05/18 1623 18     Temp 09/05/18 1623 98.2 F (36.8 C)     Temp Source 09/05/18 1623 Oral     SpO2 09/05/18 1623 99 %     Weight 09/05/18 1637 188 lb (85.3 kg)     Height 09/05/18 1637 5\' 9"  (1.753 m)     Head Circumference --      Peak Flow --      Pain Score 09/05/18 1637 0     Pain Loc --      Pain Edu? --      Excl. in GC? --    Constitutional: Alert and oriented. Well appearing and in no distress. Eyes: Normal exam ENT   Head: Normocephalic and atraumatic   Mouth/Throat: Mucous membranes are moist. Cardiovascular: Normal rate, regular rhythm. No murmur Respiratory: Normal respiratory effort without tachypnea nor retractions. Breath sounds are clear Gastrointestinal: Soft and nontender. No distention.  Musculoskeletal: Nontender with normal range of motion in all extremities. Neurologic:  Normal speech and language. No gross focal neurologic deficits Skin:  Skin is warm, dry and intact.  Psychiatric: Mood and affect are normal.   ____________________________________________    EKG  EKG viewed and interpreted by myself shows a normal sinus rhythm at 67 bpm with a narrow QRS, normal axis, normal intervals, no concerning ST changes.  ____________________________________________   INITIAL IMPRESSION / ASSESSMENT AND PLAN / ED COURSE  Pertinent labs & imaging results that were available during my care of the patient were reviewed by me and considered in my medical decision making (see chart for details).  Patient presents to the emergency department after near syncopal episode.   Description of the event sounds very consistent with vasovagal syncope/near syncope.  Patient denies any chest pain.  Lab work is reassuring at this time with a negative troponin.  Given the patient's history of CAD with a prior coronary stent we will repeat a troponin, IV hydrate and continue to closely monitor.  Patient's exam is very reassuring at this time.  Patient agreeable to plan of care.  Repeat troponin is negative.  Patient continues to appear very well.  Blood pressure slightly on the lower end, received fluids.  Tolerating food and liquids without any issue.  We will discharge with PCP follow-up.  ____________________________________________   FINAL CLINICAL IMPRESSION(S) / ED DIAGNOSES  Vasovagal near syncope Near syncope   Minna Antis, MD 09/05/18 2034

## 2018-11-09 ENCOUNTER — Telehealth: Payer: Self-pay | Admitting: Cardiovascular Disease

## 2018-11-09 NOTE — Telephone Encounter (Signed)
Pt c/o medication issue:  1. Name of Medication: hydrochlorothiazide   2. How are you currently taking this medication (dosage and times per day)? 12.5 MG 1 tablet daily   3. Are you having a reaction (difficulty breathing--STAT)? Has been feeling lightheaded, lost weight   4. What is your medication issue? Patient states he saw PCP and he told him to stop taking hydrochlorothiazide.  Patient would like to know if Dr Kirke Corin agrees with this.  Please call to discuss.

## 2018-11-10 NOTE — Telephone Encounter (Signed)
Returned call to patient with advice from Dr. Kirke Corin.   Pt verbalized understanding and will d/c HCTZ.   Advised pt to call for any further questions or concerns.

## 2018-11-10 NOTE — Telephone Encounter (Signed)
Returned call to patient.   He was seen by PCP yesterday for c/o lightheadedness.  On HCTZ, PCP wants to d/c. Pt wants to make sure Dr. Kirke Corin is okay with it before he stops. He denies chest pain, SOB or palpitations. BP at PCP 132/78 (does not remember HR but said it was "normal").  Had 2 syncopal episode this year (last feb 2020) after taking sildenafil. Fell to the floor from standing. He has not taken it since.   He reports decrease in PO intake since separation with wife. Lost 16 lbs total. Now 187 lbs.  He is now pushing more fluids. He has had 2 water bottles today.   Per Epic pt is past due on 6 mo f/u. Made appt with Ward Givens, NP this Thursday afternoon. Pt will get BP, HR and weight prior to appt.   Verbally consented to e visit.   Routed to provider to advise if further orders are needed prior to OV.

## 2018-11-10 NOTE — Telephone Encounter (Signed)
Yes I am okay stopping hydrochlorothiazide.

## 2018-11-12 ENCOUNTER — Other Ambulatory Visit: Payer: Self-pay

## 2018-11-12 ENCOUNTER — Telehealth: Payer: BLUE CROSS/BLUE SHIELD | Admitting: Nurse Practitioner

## 2018-11-26 DIAGNOSIS — R825 Elevated urine levels of drugs, medicaments and biological substances: Secondary | ICD-10-CM | POA: Diagnosis not present

## 2018-11-26 DIAGNOSIS — Z720 Tobacco use: Secondary | ICD-10-CM | POA: Diagnosis not present

## 2018-11-26 DIAGNOSIS — G9009 Other idiopathic peripheral autonomic neuropathy: Secondary | ICD-10-CM | POA: Diagnosis not present

## 2018-11-26 DIAGNOSIS — F338 Other recurrent depressive disorders: Secondary | ICD-10-CM | POA: Diagnosis not present

## 2018-11-26 DIAGNOSIS — Z716 Tobacco abuse counseling: Secondary | ICD-10-CM | POA: Diagnosis not present

## 2018-11-26 DIAGNOSIS — R61 Generalized hyperhidrosis: Secondary | ICD-10-CM | POA: Diagnosis not present

## 2018-11-26 DIAGNOSIS — Z91048 Other nonmedicinal substance allergy status: Secondary | ICD-10-CM | POA: Diagnosis not present

## 2018-11-26 DIAGNOSIS — R42 Dizziness and giddiness: Secondary | ICD-10-CM | POA: Diagnosis not present

## 2018-12-02 DIAGNOSIS — Z8674 Personal history of sudden cardiac arrest: Secondary | ICD-10-CM | POA: Diagnosis not present

## 2018-12-02 DIAGNOSIS — I10 Essential (primary) hypertension: Secondary | ICD-10-CM | POA: Diagnosis not present

## 2018-12-02 DIAGNOSIS — F339 Major depressive disorder, recurrent, unspecified: Secondary | ICD-10-CM | POA: Diagnosis not present

## 2018-12-02 DIAGNOSIS — E785 Hyperlipidemia, unspecified: Secondary | ICD-10-CM | POA: Diagnosis not present

## 2018-12-02 DIAGNOSIS — Z0001 Encounter for general adult medical examination with abnormal findings: Secondary | ICD-10-CM | POA: Diagnosis not present

## 2018-12-02 DIAGNOSIS — K219 Gastro-esophageal reflux disease without esophagitis: Secondary | ICD-10-CM | POA: Diagnosis not present

## 2018-12-10 ENCOUNTER — Other Ambulatory Visit: Payer: BLUE CROSS/BLUE SHIELD

## 2018-12-10 ENCOUNTER — Telehealth: Payer: Self-pay | Admitting: *Deleted

## 2018-12-10 DIAGNOSIS — R6889 Other general symptoms and signs: Secondary | ICD-10-CM | POA: Diagnosis not present

## 2018-12-10 DIAGNOSIS — Z20822 Contact with and (suspected) exposure to covid-19: Secondary | ICD-10-CM

## 2018-12-10 NOTE — Telephone Encounter (Signed)
Contacted pt to arrange testing; pt offered and accepted appointment at [facility] site 12/10/2018 at 1515; pt given address, directions, and instructions that he and any occupants of his vehicle should wear a mask; he verbalized understanding; orders placed per protocol.

## 2018-12-10 NOTE — Telephone Encounter (Signed)
Contacted by Dr Welford Roche, [place] of [facility]; he would like to arrange COVID testing for the pt; the pt can be contacted at [phone]; will attempt to contact pt.

## 2018-12-12 ENCOUNTER — Encounter: Payer: Self-pay | Admitting: Emergency Medicine

## 2018-12-12 ENCOUNTER — Other Ambulatory Visit: Payer: Self-pay

## 2018-12-12 ENCOUNTER — Emergency Department
Admission: EM | Admit: 2018-12-12 | Discharge: 2018-12-12 | Disposition: A | Discharge status: Home / Self Care | Payer: BC Managed Care – PPO | Attending: Emergency Medicine | Admitting: Emergency Medicine

## 2018-12-12 DIAGNOSIS — K219 Gastro-esophageal reflux disease without esophagitis: Secondary | ICD-10-CM | POA: Diagnosis not present

## 2018-12-12 DIAGNOSIS — F17210 Nicotine dependence, cigarettes, uncomplicated: Secondary | ICD-10-CM | POA: Diagnosis not present

## 2018-12-12 DIAGNOSIS — F1010 Alcohol abuse, uncomplicated: Secondary | ICD-10-CM | POA: Insufficient documentation

## 2018-12-12 DIAGNOSIS — Z79899 Other long term (current) drug therapy: Secondary | ICD-10-CM | POA: Insufficient documentation

## 2018-12-12 HISTORY — DX: Alcohol abuse, uncomplicated: F10.10

## 2018-12-12 LAB — CBC WITH DIFFERENTIAL/PLATELET
Abs Immature Granulocytes: 0.07 10*3/uL (ref 0.00–0.07)
Basophils Absolute: 0.1 10*3/uL (ref 0.0–0.1)
Basophils Relative: 1 %
Eosinophils Absolute: 0.2 10*3/uL (ref 0.0–0.5)
Eosinophils Relative: 2 %
HCT: 39.1 % (ref 39.0–52.0)
Hemoglobin: 13.2 g/dL (ref 13.0–17.0)
Immature Granulocytes: 1 %
Lymphocytes Relative: 30 %
Lymphs Abs: 2.9 10*3/uL (ref 0.7–4.0)
MCH: 32.6 pg (ref 26.0–34.0)
MCHC: 33.8 g/dL (ref 30.0–36.0)
MCV: 96.5 fL (ref 80.0–100.0)
Monocytes Absolute: 0.7 10*3/uL (ref 0.1–1.0)
Monocytes Relative: 7 %
Neutro Abs: 5.7 10*3/uL (ref 1.7–7.7)
Neutrophils Relative %: 59 %
Platelets: 394 10*3/uL (ref 150–400)
RBC: 4.05 MIL/uL — ABNORMAL LOW (ref 4.22–5.81)
RDW: 13.9 % (ref 11.5–15.5)
WBC: 9.6 10*3/uL (ref 4.0–10.5)
nRBC: 0 % (ref 0.0–0.2)

## 2018-12-12 LAB — COMPREHENSIVE METABOLIC PANEL
ALT: 37 U/L (ref 0–44)
AST: 41 U/L (ref 15–41)
Albumin: 4 g/dL (ref 3.5–5.0)
Alkaline Phosphatase: 62 U/L (ref 38–126)
Anion gap: 13 (ref 5–15)
BUN: 11 mg/dL (ref 6–20)
CO2: 23 mmol/L (ref 22–32)
Calcium: 8.9 mg/dL (ref 8.9–10.3)
Chloride: 107 mmol/L (ref 98–111)
Creatinine, Ser: 0.72 mg/dL (ref 0.61–1.24)
GFR calc Af Amer: >60 mL/min (ref >60)
GFR calc non Af Amer: >60 mL/min (ref >60)
Glucose, Bld: 94 mg/dL (ref 70–99)
Potassium: 4 mmol/L (ref 3.5–5.1)
Sodium: 143 mmol/L (ref 135–145)
Total Bilirubin: 0.2 mg/dL — ABNORMAL LOW (ref 0.3–1.2)
Total Protein: 7.4 g/dL (ref 6.5–8.1)

## 2018-12-12 LAB — URINE DRUG SCREEN, QUALITATIVE (ARMC ONLY)
Amphetamines, Ur Screen: NOT DETECTED
Barbiturates, Ur Screen: NOT DETECTED
Benzodiazepine, Ur Scrn: POSITIVE — AB
Cannabinoid 50 Ng, Ur ~~LOC~~: NOT DETECTED
Cocaine Metabolite,Ur ~~LOC~~: NOT DETECTED
MDMA (Ecstasy)Ur Screen: NOT DETECTED
Methadone Scn, Ur: NOT DETECTED
Opiate, Ur Screen: NOT DETECTED
Phencyclidine (PCP) Ur S: NOT DETECTED
Tricyclic, Ur Screen: NOT DETECTED

## 2018-12-12 LAB — ACETAMINOPHEN LEVEL: Acetaminophen (Tylenol), Serum: <10 ug/mL — ABNORMAL LOW (ref 10–30)

## 2018-12-12 LAB — NOVEL CORONAVIRUS, NAA: SARS-CoV-2, NAA: NOT DETECTED

## 2018-12-12 LAB — ETHANOL: Alcohol, Ethyl (B): 326 mg/dL (ref <10)

## 2018-12-12 LAB — SALICYLATE LEVEL: Salicylate Lvl: <7 mg/dL (ref 2.8–30.0)

## 2018-12-12 MED ORDER — DEXMEDETOMIDINE HCL IN NACL 400 MCG/100ML IV SOLN: 0.2000 ug/kg/h | INTRAVENOUS | Status: DC

## 2018-12-12 MED ORDER — GABAPENTIN 300 MG PO CAPS
300.0000 mg | ORAL_CAPSULE | Freq: Four times a day (QID) | ORAL | Status: DC
  Administered 2018-12-12: 300 mg via ORAL
  Filled 2018-12-12: qty 1

## 2018-12-12 MED ORDER — NICOTINE 21 MG/24HR TD PT24
21.0000 mg | MEDICATED_PATCH | Freq: Once | TRANSDERMAL | Status: DC
  Filled 2018-12-12: qty 1

## 2018-12-12 MED ORDER — GABAPENTIN 300 MG PO CAPS: 300.0000 mg | ORAL_CAPSULE | Freq: Four times a day (QID) | ORAL | 2 refills | Status: DC

## 2018-12-12 MED ORDER — THIAMINE HCL 100 MG/ML IJ SOLN
Freq: Once | INTRAVENOUS | Status: AC
  Administered 2018-12-12: 17:00:00 via INTRAVENOUS
  Filled 2018-12-12: qty 1000

## 2018-12-12 NOTE — ED Triage Notes (Signed)
First Nurse Note:  Arrives with [employer] for mental health evaluation.  Patient is here voluntarily.  States recently discharged from a detox center in [place].

## 2018-12-12 NOTE — Discharge Instructions (Signed)
Please return here if you get shaky or have any other problems.  Please use the gabapentin as directed.  I would start going to Alcoholics Anonymous.  That is the best thing we have to get off of alcohol.  He is return here for any further problems.  Please return here if you are suicidal or want to hurt anybody else.  If you do not have a ride you can call the police they can come and bring you without any trouble.

## 2018-12-12 NOTE — BH Assessment (Signed)
Assessment Note  Dustin Velazquez is an 58 y.o. male who presents to the ER seeking assistance for his alcohol use. Patient reports, his father brought him to the ER to get help.  Patient states he was recently discharged from a treatment facility in [place]. He was there for a few days before coming back home. He reports, "just as soon as I got home, I started back drinking..." Patient further reports, he recently separated from his wife due to his alcohol use. "I'm a liar. That's all I can say. I lied about my drinking and she couldn't do it anymore..."   Patient states he drinks on a daily basis. It's between a "half a pint of tequila and about eight (12oz) beers." He states he doesn't drink that amount every day but it's usually a combination of it. Per his report, his symptoms of withdrawal are; shakes and nausea. He currently denies any symptoms.  During the interview, the patient was calm, cooperative and pleasant. He was able to provide the appropriate answer to the questions. Throughout the interview, he denied SI/HI and AV/H. He also denies the use of any other mind-altering substances. He denies involvement with the legal system and denies the history of aggression.  Diagnosis: Alcohol Use Disorder  Past Medical History:  Past Medical History:  Diagnosis Date  . ETOH abuse   . GERD (gastroesophageal reflux disease)   . Hypercholesteremia   . Hypertension   . Sleep walking disorder     Past Surgical History:  Procedure Laterality Date  . APPENDECTOMY    . CORONARY STENT INTERVENTION N/A 12/30/2016   Procedure: Coronary Stent Intervention;  Surgeon: Wellington Hampshire, MD;  Location: [place] CV LAB;  Service: Cardiovascular;  Laterality: N/A;  . KNEE SURGERY    . LEFT HEART CATH AND CORONARY ANGIOGRAPHY N/A 12/30/2016   Procedure: Left Heart Cath and Coronary Angiography;  Surgeon: Wellington Hampshire, MD;  Location: [place] CV LAB;  Service: Cardiovascular;  Laterality:  N/A;    Family History:  Family History  Problem Relation Age of Onset  . Hypertension Mother   . Hyperlipidemia Father     Social History:  reports that he has been smoking cigarettes. He has a 7.50 pack-year smoking history. He has never used smokeless tobacco. He reports current alcohol use. He reports that he does not use drugs.  Additional Social History:  Alcohol / Drug Use Pain Medications: See PTA Prescriptions: See PTA Over the Counter: See PTA History of alcohol / drug use?: Yes Longest period of sobriety (when/how long): Unable quantify Negative Consequences of Use: Personal relationships, Work / School Withdrawal Symptoms: Nausea / Vomiting, Tremors Substance #1 Name of Substance 1: Alcohol 1 - Age of First Use: 15 1 - Amount (size/oz): "Half a pint of Tequila and eight beers" 1 - Frequency: "Oh everyday but not like that" 1 - Duration: Unable to quantify 1 - Last Use / Amount: 12/12/2018  CIWA: CIWA-Ar BP: 125/75 Pulse Rate: (!) 120 Nausea and Vomiting: no nausea and no vomiting Tactile Disturbances: very mild itching, pins and needles, burning or numbness Tremor: no tremor Auditory Disturbances: very mild harshness or ability to frighten Paroxysmal Sweats: no sweat visible Visual Disturbances: not present Anxiety: mildly anxious Headache, Fullness in Head: none present Agitation: normal activity Orientation and Clouding of Sensorium: oriented and can do serial additions CIWA-Ar Total: 3 COWS:    Allergies:  Allergies  Allergen Reactions  . Compazine [Prochlorperazine Edisylate] Other (See Comments)  seizure    Home Medications: (Not in a hospital admission)   OB/GYN Status:  No LMP for male patient.  General Assessment Data Location of Assessment: [facility] ED TTS Assessment: In system Is this a Tele or Face-to-Face [facility] Is this an Initial Assessment or a Re-assessment for this encounter?: Initial Assessment Language Other  than English: No Living Arrangements: Other (Comment)(Private Home) What gender do you identify as?: Male Marital status: Separated Pregnancy Status: No Living Arrangements: Alone Can pt return to current living arrangement?: Yes Admission Status: Voluntary Is patient capable of signing voluntary admission?: Yes Referral Source: Self/Family/Friend Insurance type: BCBS  Medical Screening Exam [facility] Walk-in ONLY) Medical Exam completed: Yes  Crisis Care Plan Living Arrangements: Alone Legal Guardian: Other:(Self) Name of Psychiatrist: Reports of none Name of Therapist: Reports of none  Education Status Is patient currently in school?: No Is the patient employed, unemployed or receiving disability?: Employed  Risk to self with the past 6 months Suicidal Ideation: No Has patient been a risk to self within the past 6 months prior to admission? : No Suicidal Intent: No Has patient had any suicidal intent within the past 6 months prior to admission? : No Is patient at risk for suicide?: No Suicidal Plan?: No Has patient had any suicidal plan within the past 6 months prior to admission? : No Access to Means: No What has been your use of drugs/alcohol within the last 12 months?: Alcohol Previous Attempts/Gestures: No How many times?: 0 Other Self Harm Risks: Active alcohol use Triggers for Past Attempts: None known Intentional Self Injurious Behavior: None Family Suicide History: Unknown Recent stressful life event(s): Divorce, Other (Comment) Persecutory voices/beliefs?: No Depression: Yes Depression Symptoms: Tearfulness, Fatigue, Feeling worthless/self pity Substance abuse history and/or treatment for substance abuse?: Yes Suicide prevention information given to non-admitted patients: Not applicable  Risk to Others within the past 6 months Homicidal Ideation: No Does patient have any lifetime risk of violence toward others beyond the six months prior to admission? :  No Thoughts of Harm to Others: No Current Homicidal Intent: No Current Homicidal Plan: No Access to Homicidal Means: No Identified Victim: Reports of none History of harm to others?: No Assessment of Violence: None Noted Violent Behavior Description: Reports of none Does patient have access to weapons?: No Criminal Charges Pending?: No Does patient have a court date: No Is patient on probation?: No  Psychosis Hallucinations: None noted Delusions: None noted  Mental Status Report Appearance/Hygiene: Unremarkable, In scrubs Eye Contact: Good Motor Activity: Unable to assess(Patient was sitting during the interview) Speech: Logical/coherent, Unremarkable Level of Consciousness: Alert Mood: Pleasant, Sad Affect: Appropriate to circumstance, Sad Anxiety Level: None Thought Processes: Coherent, Relevant Judgement: Partial(Patient is intoxicated) Orientation: Person, Place, Time, Situation, Appropriate for developmental age Obsessive Compulsive Thoughts/Behaviors: None  Cognitive Functioning Concentration: Normal Memory: Recent Intact, Remote Intact Is patient IDD: No Insight: Fair Impulse Control: Fair Appetite: Fair Have you had any weight changes? : No Change Sleep: Decreased(Trouble falling and staying asleep) Total Hours of Sleep: 5 Vegetative Symptoms: None  ADLScreening [facility] Assessment Services) Patient's cognitive ability adequate to safely complete daily activities?: Yes Patient able to express need for assistance with ADLs?: Yes Independently performs ADLs?: Yes (appropriate for developmental age)  Prior Inpatient Therapy Prior Inpatient Therapy: Yes Prior Therapy Dates: 12/2018 Prior Therapy Facilty/Provider([place] Reason for Treatment: Alcohol Detox and Substance Abuse Treatment  Prior Outpatient Therapy Prior Outpatient Therapy: No Does patient have an ACCT team?: No Does  patient have Intensive In-House Services?  : No Does  patient have [facility] services? : No Does patient have P4CC services?: No  ADL Screening (condition at time of admission) Patient's cognitive ability adequate to safely complete daily activities?: Yes Is the patient deaf or have difficulty hearing?: No Does the patient have difficulty seeing, even when wearing glasses/contacts?: No Does the patient have difficulty concentrating, remembering, or making decisions?: No Patient able to express need for assistance with ADLs?: Yes Does the patient have difficulty dressing or bathing?: No Independently performs ADLs?: Yes (appropriate for developmental age) Does the patient have difficulty walking or climbing stairs?: No Weakness of Legs: None Weakness of Arms/Hands: None  Home Assistive Devices/Equipment Home Assistive Devices/Equipment: None  Therapy Consults (therapy consults require a physician order) PT Evaluation Needed: No OT Evalulation Needed: No SLP Evaluation Needed: No Abuse/Neglect Assessment (Assessment to be complete while patient is alone) Abuse/Neglect Assessment Can Be Completed: Yes Physical Abuse: Denies Verbal Abuse: Denies Sexual Abuse: Denies Self-Neglect: Denies Values / Beliefs Cultural Requests During Hospitalization: None Spiritual Requests During Hospitalization: None Consults Spiritual Care Consult Needed: No Social Work Consult Needed: No Merchant navy officer (For Healthcare) Does Patient Have a Medical Advance Directive?: No Would patient like information on creating a medical advance directive?: No - Patient declined       Child/Adolescent Assessment Running Away Risk: Denies(Patient is new)  Disposition:  Disposition Initial Assessment Completed for this Encounter: Yes  On Site Evaluation by:   Reviewed with Physician:    Lilyan Gilford MS, LCAS, [facility], NCC, CCSI Therapeutic Triage Specialist 12/12/2018 5:30 PM

## 2018-12-12 NOTE — ED Triage Notes (Signed)
Pt to ED via [employer] for detox. Pt reports that his last drink was about 1 hour PTA. Pt is cooperative at this time. Pt is every day drinker, pt drinks 12-18 beers a day and liquor, about 0.5-1 pint per day.

## 2018-12-12 NOTE — ED Notes (Signed)
Pt found at the edge of the bed wanting to go home. Pt in hurry to leave at this time and des not want to stay. MD Malinda aware.

## 2018-12-12 NOTE — ED Notes (Signed)
This RN spoke to pt in depth about what pt is here for. Pt states that he does not want to kill himself but that he sent the threats to his parents to get attention. Pt states that he likes himself too much and would not hurt himself. He has been separated from his wife for 4 months and misses her. Pt states that his parents are worried about him because of his drinking but pt does not want to kill himself.

## 2018-12-12 NOTE — ED Notes (Signed)
Pt resting in NAD at this time.

## 2018-12-12 NOTE — BH Assessment (Signed)
Patient request information for outpatient. He stated he did not want to go into an inpatient detox facility. "I need to go to back to work..."  Discussed patient with ER MD (Dr. Cinda Quest) and patient is able to discharge home when medically cleared. Patient was giving referral information and instructions on how to follow up with Outpatient Treatment ([facility] [employer]) and [employer].  Writer also advised the patient to call the toll free phone on his insurance card for the counselors and agencies in his network.  Patient advised to call the [facility] through their job to help with Outpatient Treatment.  Patient denies SI/HI and AV/H.

## 2018-12-12 NOTE — ED Notes (Signed)
TTS at bedside. 

## 2018-12-12 NOTE — ED Notes (Signed)
This RN spoke to pt's mother who reports that pt is threatening suicide and that he should not be allowed to go home. Pt has not reported any suicidal thoughts since his arrival here. Pt wants to go home and follow up with outpatient therapy. Pt mother wants to take out IVC paperwork but magistrate will not sign the papers at this time. Pt remains voluntary and pt awaiting finishing fluids and MD decision. Dr. Cinda Quest notified.

## 2018-12-12 NOTE — ED Provider Notes (Signed)
[facility] Provider Note   ____________________________________________   First MD Initiated Contact with Patient 12/12/18 1557     (approximate)  I have reviewed the triage vital signs and the nursing notes.   HISTORY  Chief Complaint detox    HPI Dustin Velazquez is a 58 y.o. male who reports he drinks 12-18 beers a day and liquor.  About a half a pint to pint per day.  Patient also smokes.  He asked for nicotine patch.  He reports he was recently charged from detox center in [place].  He says it was not very good.  He started drinking again this morning at 430 and has father and brother brought him in.  He has no other symptoms at present.  Past history of heart attack and stent.  He is taking metoprolol and Brilinta he says.         Past Medical History:  Diagnosis Date  . ETOH abuse   . GERD (gastroesophageal reflux disease)   . Hypercholesteremia   . Hypertension   . Sleep walking disorder     Patient Active Problem List   Diagnosis Date Noted  . Alcohol abuse 12/12/2018  . Non-STEMI (non-ST elevated myocardial infarction) (HCC) 12/29/2016    Past Surgical History:  Procedure Laterality Date  . APPENDECTOMY    . CORONARY STENT INTERVENTION N/A 12/30/2016   Procedure: Coronary Stent Intervention;  Surgeon: Iran Ouch, MD;  Location: [facility] INVASIVE CV LAB;  Service: Cardiovascular;  Laterality: N/A;  . KNEE SURGERY    . LEFT HEART CATH AND CORONARY ANGIOGRAPHY N/A 12/30/2016   Procedure: Left Heart Cath and Coronary Angiography;  Surgeon: Iran Ouch, MD;  Location: [facility] INVASIVE CV LAB;  Service: Cardiovascular;  Laterality: N/A;    Prior to Admission medications   Medication Sig Start Date End Date Taking? Authorizing Provider  aspirin EC 81 MG tablet Take 81 mg by mouth daily.    [provider]  gabapentin (NEURONTIN) 300 MG capsule Take 1 capsule (300 mg total) by mouth every 6 (six)  hours. 12/12/18   Charm Rings, NP  hydrochlorothiazide (MICROZIDE) 12.5 MG capsule Take 12.5 mg by mouth daily.    [provider]  lisinopril (PRINIVIL,ZESTRIL) 40 MG tablet Take 40 mg by mouth daily. 11/07/16   [provider]  metoprolol tartrate (LOPRESSOR) 50 MG tablet TAKE ONE TABLET BY MOUTH TWICE DAILY 06/19/18   Iran Ouch, MD  omeprazole (PRILOSEC) 20 MG capsule Take 20 mg by mouth daily. 11/19/16   [provider]  rosuvastatin (CRESTOR) 40 MG tablet Take 1 tablet (40 mg total) by mouth daily. 03/05/18 09/05/18  Iran Ouch, MD  ticagrelor (BRILINTA) 60 MG TABS tablet Take 1 tablet (60 mg total) by mouth 2 (two) times daily. 03/05/18   Iran Ouch, MD    Allergies Compazine [prochlorperazine edisylate]  Family History  Problem Relation Age of Onset  . Hypertension Mother   . Hyperlipidemia Father     Social History Social History   Tobacco Use  . Smoking status: Current Every Day Smoker    Packs/day: 0.25    Years: 30.00    Pack years: 7.50    Types: Cigarettes  . Smokeless tobacco: Never Used  Substance Use Topics  . Alcohol use: Yes  . Drug use: No    Review of Systems  Constitutional: No fever/chills Eyes: No visual changes. ENT: No sore throat. Cardiovascular: Denies chest pain. Respiratory:  Denies shortness of breath. Gastrointestinal: No abdominal pain.  No nausea, no vomiting.  No diarrhea.  No constipation. Genitourinary: Negative for dysuria. Musculoskeletal: Negative for back pain. Skin: Negative for rash. Neurological: Negative for headaches, focal weakness    ____________________________________________   PHYSICAL EXAM:  VITAL SIGNS: ED Triage Vitals [12/12/18 1537]  Enc Vitals Group     BP 125/75     Pulse Rate (!) 120     Resp 16     Temp 98.4 F (36.9 C)     Temp Source Oral     SpO2 95 %     Weight 187 lb (84.8 kg)     Height 5\' 10"  (1.778 m)     Head Circumference      Peak Flow       Pain Score 0     Pain Loc      Pain Edu?      Excl. in [place]?     Constitutional: Alert and oriented.  Patient reports he is down and feeling poorly about himself but has no physical complaints. Eyes: Conjunctivae are normal. Head: Atraumatic. Nose: No congestion/rhinnorhea. Mouth/Throat: Mucous membranes are moist.  Oropharynx non-erythematous. Neck: No stridor Cardiovascular: Normal rate, regular rhythm. Grossly normal heart sounds.  Good peripheral circulation. Respiratory: Normal respiratory effort.  No retractions. Lungs CTAB. Gastrointestinal: Soft and nontender. No distention. No abdominal bruits. No CVA tenderness. Musculoskeletal: No lower extremity tenderness nor edema.   Neurologic:  Normal speech and language. No gross focal neurologic deficits are appreciated. No gait instability. Skin:  Skin is warm, dry and intact. No rash noted. .  ____________________________________________   LABS (all labs ordered are listed, but only abnormal results are displayed)  Labs Reviewed  COMPREHENSIVE METABOLIC PANEL - Abnormal; Notable for the following components:      Result Value   Total Bilirubin 0.2 (*)    All other components within normal limits  ACETAMINOPHEN LEVEL - Abnormal; Notable for the following components:   Acetaminophen (Tylenol), Serum <10 (*)    All other components within normal limits  ETHANOL - Abnormal; Notable for the following components:   Alcohol, Ethyl (B) 326 (*)    All other components within normal limits  CBC WITH DIFFERENTIAL/PLATELET - Abnormal; Notable for the following components:   RBC 4.05 (*)    All other components within normal limits  URINE DRUG SCREEN, QUALITATIVE ([facility] ONLY) - Abnormal; Notable for the following components:   Benzodiazepine, Ur Scrn POSITIVE (*)    All other components within normal limits  SALICYLATE LEVEL   ____________________________________________  EKG   ____________________________________________   RADIOLOGY  ED MD interpretation:    Official radiology report(s): No results found.  ____________________________________________   PROCEDURES  Procedure(s) performed (including Critical Care):  Procedures   ____________________________________________   INITIAL IMPRESSION / ASSESSMENT AND PLAN / ED COURSE  Patient's father calls and tells me he is very worried about his son.  He says that the patient has been drinking a lot he knows about the rehab in [place] apparently went to pick him up from there.  Patient asked me to talk to the son about staying I did so son does not want to stay wants to do outpatient detox.  I called the patient's dad back as the son wanted me to call him to come pick him up I got the patient's mom.  The patient's mom says patient was texting people in his family this morning saying he was going to kill himself  and texting pictures of guns or a gun at least 2 members of his family.  His father is worried about him and his mother is worried about him they will think he might kill himself he is also been doing similar things with his ex-wife she is also worried about him.  I have asked the mom and dad to talk to the magistrate about getting papers out on this gentleman will also have to see pictures of the texts.  We can see if we can get him committed or at least watch him until he is completely cleared and sober. ----------------------------------------- 8:05 PM on 12/12/2018 -----------------------------------------    Patient continues to say that he is not suicidal He is clinically acting sober.  He is not uncoordinated.  I can discharge him if he can get a ride.  He is trying to get his father to come get him.         ____________________________________________   FINAL CLINICAL IMPRESSION(S) / ED DIAGNOSES  Final diagnoses:  Alcohol abuse     ED Discharge Orders         Ordered    gabapentin (NEURONTIN) 300 MG capsule  Every 6 hours      12/12/18 1634    Increase activity slowly     12/12/18 1634    Diet - low sodium heart healthy     12/12/18 1634    Discharge instructions    Comments:  Follow up with [facility] and/or AA   12/12/18 1634           Note:  This document was prepared using Dragon voice recognition software and may include unintentional dictation errors.    Arnaldo NatalMalinda, Milley Vining F, MD 12/12/18 2005

## 2018-12-13 ENCOUNTER — Other Ambulatory Visit: Payer: Self-pay

## 2018-12-13 ENCOUNTER — Encounter: Payer: Self-pay | Admitting: Emergency Medicine

## 2018-12-13 DIAGNOSIS — F1029 Alcohol dependence with unspecified alcohol-induced disorder: Secondary | ICD-10-CM | POA: Diagnosis not present

## 2018-12-13 DIAGNOSIS — K219 Gastro-esophageal reflux disease without esophagitis: Secondary | ICD-10-CM | POA: Diagnosis not present

## 2018-12-13 DIAGNOSIS — Z20828 Contact with and (suspected) exposure to other viral communicable diseases: Secondary | ICD-10-CM | POA: Insufficient documentation

## 2018-12-13 DIAGNOSIS — R45851 Suicidal ideations: Secondary | ICD-10-CM | POA: Insufficient documentation

## 2018-12-13 DIAGNOSIS — Z8349 Family history of other endocrine, nutritional and metabolic diseases: Secondary | ICD-10-CM | POA: Diagnosis not present

## 2018-12-13 DIAGNOSIS — Z7982 Long term (current) use of aspirin: Secondary | ICD-10-CM | POA: Insufficient documentation

## 2018-12-13 DIAGNOSIS — Z03818 Encounter for observation for suspected exposure to other biological agents ruled out: Secondary | ICD-10-CM | POA: Diagnosis not present

## 2018-12-13 DIAGNOSIS — Z8249 Family history of ischemic heart disease and other diseases of the circulatory system: Secondary | ICD-10-CM | POA: Diagnosis not present

## 2018-12-13 DIAGNOSIS — Z888 Allergy status to other drugs, medicaments and biological substances status: Secondary | ICD-10-CM | POA: Diagnosis not present

## 2018-12-13 DIAGNOSIS — F332 Major depressive disorder, recurrent severe without psychotic features: Secondary | ICD-10-CM | POA: Diagnosis not present

## 2018-12-13 DIAGNOSIS — E7800 Pure hypercholesterolemia, unspecified: Secondary | ICD-10-CM | POA: Diagnosis not present

## 2018-12-13 DIAGNOSIS — F329 Major depressive disorder, single episode, unspecified: Secondary | ICD-10-CM | POA: Insufficient documentation

## 2018-12-13 DIAGNOSIS — I252 Old myocardial infarction: Secondary | ICD-10-CM | POA: Diagnosis not present

## 2018-12-13 DIAGNOSIS — Z79899 Other long term (current) drug therapy: Secondary | ICD-10-CM | POA: Insufficient documentation

## 2018-12-13 DIAGNOSIS — F1010 Alcohol abuse, uncomplicated: Secondary | ICD-10-CM | POA: Diagnosis not present

## 2018-12-13 DIAGNOSIS — Z635 Disruption of family by separation and divorce: Secondary | ICD-10-CM | POA: Diagnosis not present

## 2018-12-13 DIAGNOSIS — F10229 Alcohol dependence with intoxication, unspecified: Secondary | ICD-10-CM | POA: Insufficient documentation

## 2018-12-13 DIAGNOSIS — I10 Essential (primary) hypertension: Secondary | ICD-10-CM | POA: Insufficient documentation

## 2018-12-13 DIAGNOSIS — I259 Chronic ischemic heart disease, unspecified: Secondary | ICD-10-CM | POA: Insufficient documentation

## 2018-12-13 DIAGNOSIS — F17210 Nicotine dependence, cigarettes, uncomplicated: Secondary | ICD-10-CM | POA: Insufficient documentation

## 2018-12-13 DIAGNOSIS — F419 Anxiety disorder, unspecified: Secondary | ICD-10-CM | POA: Diagnosis not present

## 2018-12-13 DIAGNOSIS — Z7902 Long term (current) use of antithrombotics/antiplatelets: Secondary | ICD-10-CM | POA: Insufficient documentation

## 2018-12-13 DIAGNOSIS — Z1159 Encounter for screening for other viral diseases: Secondary | ICD-10-CM | POA: Diagnosis not present

## 2018-12-13 DIAGNOSIS — Z955 Presence of coronary angioplasty implant and graft: Secondary | ICD-10-CM | POA: Diagnosis not present

## 2018-12-13 NOTE — ED Triage Notes (Signed)
Pt arrives ambulatory to triage with c/o IVC due to pt's threatening to shoot himself while talking to his wife. Pt states that since 0600 he has drunk seven beers and over a pint of tequila and "if I had more, I would have drunk more but I ran out and it was Sunday and you can't buy liquor on Sunday". Pt states that "I would have got some on the way here, but I was in the back seat of a police car". Pt is calm and cooperative at this time and denies all SI or HI thoughts, plans, or attempts.

## 2018-12-14 ENCOUNTER — Inpatient Hospital Stay
Admission: AD | Admit: 2018-12-14 | Discharge: 2018-12-16 | DRG: 885 | Disposition: A | Discharge status: Home / Self Care | Payer: BC Managed Care – PPO | Source: Intra-hospital | Attending: Psychiatry | Admitting: Psychiatry

## 2018-12-14 ENCOUNTER — Emergency Department (HOSPITAL_COMMUNITY)
Admission: EM | Admit: 2018-12-14 | Discharge: 2018-12-14 | Disposition: A | Discharge status: Other Acute Inpatient Hospital | Payer: BC Managed Care – PPO | Source: Home / Self Care | Attending: Emergency Medicine | Admitting: Emergency Medicine

## 2018-12-14 DIAGNOSIS — F419 Anxiety disorder, unspecified: Secondary | ICD-10-CM | POA: Diagnosis present

## 2018-12-14 DIAGNOSIS — I252 Old myocardial infarction: Secondary | ICD-10-CM

## 2018-12-14 DIAGNOSIS — Z8349 Family history of other endocrine, nutritional and metabolic diseases: Secondary | ICD-10-CM

## 2018-12-14 DIAGNOSIS — Z888 Allergy status to other drugs, medicaments and biological substances status: Secondary | ICD-10-CM

## 2018-12-14 DIAGNOSIS — T1491XA Suicide attempt, initial encounter: Secondary | ICD-10-CM

## 2018-12-14 DIAGNOSIS — F1010 Alcohol abuse, uncomplicated: Secondary | ICD-10-CM

## 2018-12-14 DIAGNOSIS — Z635 Disruption of family by separation and divorce: Secondary | ICD-10-CM | POA: Diagnosis not present

## 2018-12-14 DIAGNOSIS — F332 Major depressive disorder, recurrent severe without psychotic features: Secondary | ICD-10-CM | POA: Diagnosis not present

## 2018-12-14 DIAGNOSIS — Z1159 Encounter for screening for other viral diseases: Secondary | ICD-10-CM

## 2018-12-14 DIAGNOSIS — K219 Gastro-esophageal reflux disease without esophagitis: Secondary | ICD-10-CM | POA: Diagnosis present

## 2018-12-14 DIAGNOSIS — R4589 Other symptoms and signs involving emotional state: Secondary | ICD-10-CM

## 2018-12-14 DIAGNOSIS — Z8249 Family history of ischemic heart disease and other diseases of the circulatory system: Secondary | ICD-10-CM

## 2018-12-14 DIAGNOSIS — Z955 Presence of coronary angioplasty implant and graft: Secondary | ICD-10-CM

## 2018-12-14 DIAGNOSIS — I10 Essential (primary) hypertension: Secondary | ICD-10-CM | POA: Diagnosis present

## 2018-12-14 DIAGNOSIS — R45851 Suicidal ideations: Secondary | ICD-10-CM | POA: Diagnosis present

## 2018-12-14 DIAGNOSIS — E7800 Pure hypercholesterolemia, unspecified: Secondary | ICD-10-CM | POA: Diagnosis present

## 2018-12-14 DIAGNOSIS — F10929 Alcohol use, unspecified with intoxication, unspecified: Secondary | ICD-10-CM

## 2018-12-14 LAB — URINE DRUG SCREEN, QUALITATIVE (ARMC ONLY)
Amphetamines, Ur Screen: NOT DETECTED
Barbiturates, Ur Screen: NOT DETECTED
Benzodiazepine, Ur Scrn: POSITIVE — AB
Cannabinoid 50 Ng, Ur ~~LOC~~: NOT DETECTED
Cocaine Metabolite,Ur ~~LOC~~: NOT DETECTED
MDMA (Ecstasy)Ur Screen: NOT DETECTED
Methadone Scn, Ur: NOT DETECTED
Opiate, Ur Screen: NOT DETECTED
Phencyclidine (PCP) Ur S: NOT DETECTED
Tricyclic, Ur Screen: NOT DETECTED

## 2018-12-14 LAB — CBC
HCT: 38.6 % — ABNORMAL LOW (ref 39.0–52.0)
Hemoglobin: 13.1 g/dL (ref 13.0–17.0)
MCH: 32.5 pg (ref 26.0–34.0)
MCHC: 33.9 g/dL (ref 30.0–36.0)
MCV: 95.8 fL (ref 80.0–100.0)
Platelets: 332 10*3/uL (ref 150–400)
RBC: 4.03 MIL/uL — ABNORMAL LOW (ref 4.22–5.81)
RDW: 14.1 % (ref 11.5–15.5)
WBC: 7.2 10*3/uL (ref 4.0–10.5)
nRBC: 0 % (ref 0.0–0.2)

## 2018-12-14 LAB — BASIC METABOLIC PANEL
Anion gap: 11 (ref 5–15)
BUN: 11 mg/dL (ref 6–20)
CO2: 24 mmol/L (ref 22–32)
Calcium: 8.7 mg/dL — ABNORMAL LOW (ref 8.9–10.3)
Chloride: 109 mmol/L (ref 98–111)
Creatinine, Ser: 0.77 mg/dL (ref 0.61–1.24)
GFR calc Af Amer: >60 mL/min (ref >60)
GFR calc non Af Amer: >60 mL/min (ref >60)
Glucose, Bld: 95 mg/dL (ref 70–99)
Potassium: 3.8 mmol/L (ref 3.5–5.1)
Sodium: 144 mmol/L (ref 135–145)

## 2018-12-14 LAB — SARS CORONAVIRUS 2 BY RT PCR (HOSPITAL ORDER, PERFORMED IN ~~LOC~~ HOSPITAL LAB): SARS Coronavirus 2: NEGATIVE

## 2018-12-14 LAB — ETHANOL: Alcohol, Ethyl (B): 262 mg/dL — ABNORMAL HIGH (ref <10)

## 2018-12-14 MED ORDER — ROSUVASTATIN CALCIUM 20 MG PO TABS
40.0000 mg | ORAL_TABLET | Freq: Every day | ORAL | Status: DC
  Administered 2018-12-15: 40 mg via ORAL
  Filled 2018-12-14 (×2): qty 2

## 2018-12-14 MED ORDER — DIAZEPAM 5 MG PO TABS
10.0000 mg | ORAL_TABLET | Freq: Once | ORAL | Status: AC
  Administered 2018-12-14: 10 mg via ORAL
  Filled 2018-12-14: qty 2

## 2018-12-14 MED ORDER — NICOTINE 21 MG/24HR TD PT24
21.0000 mg | MEDICATED_PATCH | Freq: Once | TRANSDERMAL | Status: DC
  Administered 2018-12-14: 21 mg via TRANSDERMAL
  Filled 2018-12-14: qty 1

## 2018-12-14 MED ORDER — ZIPRASIDONE MESYLATE 20 MG IM SOLR: 20.0000 mg | INTRAMUSCULAR | Status: DC | PRN

## 2018-12-14 MED ORDER — CHLORDIAZEPOXIDE HCL 25 MG PO CAPS: 25.0000 mg | ORAL_CAPSULE | Freq: Four times a day (QID) | ORAL | Status: DC | PRN

## 2018-12-14 MED ORDER — LORAZEPAM 1 MG PO TABS
1.0000 mg | ORAL_TABLET | ORAL | Status: AC | PRN
  Administered 2018-12-14: 1 mg via ORAL
  Filled 2018-12-14: qty 1

## 2018-12-14 MED ORDER — METOPROLOL TARTRATE 25 MG PO TABS
50.0000 mg | ORAL_TABLET | Freq: Two times a day (BID) | ORAL | Status: DC
  Administered 2018-12-14 – 2018-12-16 (×4): 50 mg via ORAL
  Filled 2018-12-14 (×4): qty 2

## 2018-12-14 MED ORDER — THIAMINE HCL 100 MG/ML IJ SOLN
100.0000 mg | Freq: Once | INTRAMUSCULAR | Status: DC
  Filled 2018-12-14: qty 2

## 2018-12-14 MED ORDER — OLANZAPINE 5 MG PO TBDP: 10.0000 mg | ORAL_TABLET | Freq: Three times a day (TID) | ORAL | Status: DC | PRN

## 2018-12-14 MED ORDER — HYDROXYZINE HCL 25 MG PO TABS
25.0000 mg | ORAL_TABLET | Freq: Four times a day (QID) | ORAL | Status: DC | PRN
  Administered 2018-12-14: 25 mg via ORAL

## 2018-12-14 MED ORDER — ADULT MULTIVITAMIN W/MINERALS CH
1.0000 | ORAL_TABLET | Freq: Every day | ORAL | Status: DC
  Administered 2018-12-15: 1 via ORAL
  Filled 2018-12-14: qty 1

## 2018-12-14 MED ORDER — TICAGRELOR 60 MG PO TABS
60.0000 mg | ORAL_TABLET | Freq: Two times a day (BID) | ORAL | Status: DC
  Administered 2018-12-14 – 2018-12-16 (×4): 60 mg via ORAL
  Filled 2018-12-14 (×5): qty 1

## 2018-12-14 MED ORDER — ACETAMINOPHEN 325 MG PO TABS: 650.0000 mg | ORAL_TABLET | Freq: Four times a day (QID) | ORAL | Status: DC | PRN

## 2018-12-14 MED ORDER — NICOTINE 21 MG/24HR TD PT24
21.0000 mg | MEDICATED_PATCH | Freq: Once | TRANSDERMAL | Status: AC
  Administered 2018-12-15: 21 mg via TRANSDERMAL
  Filled 2018-12-14: qty 1

## 2018-12-14 MED ORDER — LISINOPRIL 20 MG PO TABS
40.0000 mg | ORAL_TABLET | Freq: Every day | ORAL | Status: DC
  Administered 2018-12-15 – 2018-12-16 (×2): 40 mg via ORAL
  Filled 2018-12-14 (×2): qty 2

## 2018-12-14 MED ORDER — VITAMIN B-1 100 MG PO TABS
100.0000 mg | ORAL_TABLET | Freq: Every day | ORAL | Status: DC
  Administered 2018-12-15: 100 mg via ORAL
  Filled 2018-12-14: qty 1

## 2018-12-14 MED ORDER — CHLORDIAZEPOXIDE HCL 25 MG PO CAPS
50.0000 mg | ORAL_CAPSULE | Freq: Three times a day (TID) | ORAL | Status: DC
  Administered 2018-12-14 (×4): 50 mg via ORAL
  Filled 2018-12-14 (×4): qty 2

## 2018-12-14 MED ORDER — MAGNESIUM HYDROXIDE 400 MG/5ML PO SUSP: 30.0000 mL | Freq: Every day | ORAL | Status: DC | PRN

## 2018-12-14 MED ORDER — ONDANSETRON 4 MG PO TBDP: 4.0000 mg | ORAL_TABLET | Freq: Four times a day (QID) | ORAL | Status: DC | PRN

## 2018-12-14 MED ORDER — LOPERAMIDE HCL 2 MG PO CAPS: 2.0000 mg | ORAL_CAPSULE | ORAL | Status: DC | PRN

## 2018-12-14 MED ORDER — ASPIRIN EC 81 MG PO TBEC
81.0000 mg | DELAYED_RELEASE_TABLET | Freq: Every day | ORAL | Status: DC
  Administered 2018-12-15: 81 mg via ORAL
  Filled 2018-12-14: qty 1

## 2018-12-14 MED ORDER — DIPHENHYDRAMINE HCL 25 MG PO CAPS: 50.0000 mg | ORAL_CAPSULE | Freq: Three times a day (TID) | ORAL | Status: DC | PRN

## 2018-12-14 MED ORDER — ALUM & MAG HYDROXIDE-SIMETH 200-200-20 MG/5ML PO SUSP: 30.0000 mL | ORAL | Status: DC | PRN

## 2018-12-14 MED ORDER — PANTOPRAZOLE SODIUM 40 MG PO TBEC
40.0000 mg | DELAYED_RELEASE_TABLET | Freq: Every day | ORAL | Status: DC
  Administered 2018-12-15: 40 mg via ORAL
  Filled 2018-12-14: qty 1

## 2018-12-14 MED ORDER — HYDROXYZINE HCL 25 MG PO TABS
25.0000 mg | ORAL_TABLET | Freq: Three times a day (TID) | ORAL | Status: DC | PRN
  Filled 2018-12-14: qty 1

## 2018-12-14 NOTE — ED Notes (Addendum)
Patient ambulated over to [facility] in police custody with Nurse, I spoke to doctor and she states ' the orders are in for His admission, Patient transferred to room 8.

## 2018-12-14 NOTE — ED Provider Notes (Signed)
[facility] Emergency Department Provider Note  ____________________________________________  Time seen: Approximately 1:56 AM  I have reviewed the triage vital signs and the nursing notes.   HISTORY  Chief Complaint IVC   HPI Dustin Velazquez is a 58 y.o. male with a history of alcohol abuse, CAD, hypertension, hyperlipidemia, GERD who presents IVC for suicidal ideation.  Patient reports that him and his wife separated 4 months ago.  He had been calling her since yesterday wanting to see her.  She was ignoring his phone calls and messages.  Today he sent her a text with a picture of him with a pistol on his head.  She called 911 and patient was IVC and brought to the emergency room.  Patient tells me "I never had any thoughts about hurting myself, was just being stupid". He said "I got angry because she was not answering my phone calls and decided to drink."  He denies SI or HI.  Denies any prior history of suicide attempt.  He denies depression.  He reports that he drinks anywhere between 6-12 beers a day and several shots of tequila.  Last alcohol was prior to arrival.  Patient reports being on detox [place] days [place].  He denies history of complicated withdrawals.  He denies any medical complaints.  Past Medical History:  Diagnosis Date  . ETOH abuse   . GERD (gastroesophageal reflux disease)   . Hypercholesteremia   . Hypertension   . Sleep walking disorder     Patient Active Problem List   Diagnosis Date Noted  . Alcohol abuse 12/12/2018  . Non-STEMI (non-ST elevated myocardial infarction) (HCC) 12/29/2016    Past Surgical History:  Procedure Laterality Date  . APPENDECTOMY    . CORONARY STENT INTERVENTION N/A 12/30/2016   Procedure: Coronary Stent Intervention;  Surgeon: Iran OuchArida, Muhammad A, MD;  Location: [facility] INVASIVE CV LAB;  Service: Cardiovascular;  Laterality: N/A;  . KNEE SURGERY    . LEFT HEART CATH AND CORONARY  ANGIOGRAPHY N/A 12/30/2016   Procedure: Left Heart Cath and Coronary Angiography;  Surgeon: Iran OuchArida, Muhammad A, MD;  Location: [facility] INVASIVE CV LAB;  Service: Cardiovascular;  Laterality: N/A;    Prior to Admission medications   Medication Sig Start Date End Date Taking? Authorizing Provider  aspirin EC 81 MG tablet Take 81 mg by mouth daily.    [provider]  gabapentin (NEURONTIN) 300 MG capsule Take 1 capsule (300 mg total) by mouth every 6 (six) hours. 12/12/18   Charm RingsLord, Jamison Y, NP  hydrochlorothiazide (MICROZIDE) 12.5 MG capsule Take 12.5 mg by mouth daily.    [provider]  lisinopril (PRINIVIL,ZESTRIL) 40 MG tablet Take 40 mg by mouth daily. 11/07/16   [provider]  metoprolol tartrate (LOPRESSOR) 50 MG tablet TAKE ONE TABLET BY MOUTH TWICE DAILY 06/19/18   Iran OuchArida, Muhammad A, MD  omeprazole (PRILOSEC) 20 MG capsule Take 20 mg by mouth daily. 11/19/16   [provider]  rosuvastatin (CRESTOR) 40 MG tablet Take 1 tablet (40 mg total) by mouth daily. 03/05/18 09/05/18  Iran OuchArida, Muhammad A, MD  ticagrelor (BRILINTA) 60 MG TABS tablet Take 1 tablet (60 mg total) by mouth 2 (two) times daily. 03/05/18   Iran OuchArida, Muhammad A, MD    Allergies Compazine [prochlorperazine edisylate]  Family History  Problem Relation Age of Onset  . Hypertension Mother   . Hyperlipidemia Father     Social History Social History   Tobacco Use  .  Smoking status: Current Every Day Smoker    Packs/day: 0.25    Years: 30.00    Pack years: 7.50    Types: Cigarettes  . Smokeless tobacco: Never Used  Substance Use Topics  . Alcohol use: Yes  . Drug use: No    Review of Systems  Constitutional: Negative for fever. Eyes: Negative for visual changes. ENT: Negative for sore throat. Neck: No neck pain  Cardiovascular: Negative for chest pain. Respiratory: Negative for shortness of breath. Gastrointestinal: Negative for abdominal pain, vomiting or diarrhea. Genitourinary:  Negative for dysuria. Musculoskeletal: Negative for back pain. Skin: Negative for rash. Neurological: Negative for headaches, weakness or numbness. Psych: No SI or HI. + intoxication  ____________________________________________   PHYSICAL EXAM:  VITAL SIGNS: ED Triage Vitals [12/13/18 2337]  Enc Vitals Group     BP (!) 150/89     Pulse Rate 83     Resp 17     Temp 98.1 F (36.7 C)     Temp Source Oral     SpO2 98 %     Weight 187 lb (84.8 kg)     Height 5\' 10"  (1.778 m)     Head Circumference      Peak Flow      Pain Score 0     Pain Loc      Pain Edu?      Excl. in [place]?     Constitutional: Alert and oriented. Well appearing and in no apparent distress. HEENT:      Head: Normocephalic and atraumatic.         Eyes: Conjunctivae are normal. Sclera is non-icteric.       Mouth/Throat: Mucous membranes are moist.       Neck: Supple with no signs of meningismus. Cardiovascular: Regular rate and rhythm. No murmurs, gallops, or rubs. 2+ symmetrical distal pulses are present in all extremities. No JVD. Respiratory: Normal respiratory effort. Lungs are clear to auscultation bilaterally. No wheezes, crackles, or rhonchi.  Gastrointestinal: Soft, non tender, and non distended with positive bowel sounds. No rebound or guarding. Musculoskeletal: Nontender with normal range of motion in all extremities. No edema, cyanosis, or erythema of extremities. Neurologic: Normal speech and language. Face is symmetric. Moving all extremities. No gross focal neurologic deficits are appreciated. Skin: Skin is warm, dry and intact. No rash noted. Psychiatric: Mood and affect are normal. Speech and behavior are normal.  ____________________________________________   LABS (all labs ordered are listed, but only abnormal results are displayed)  Labs Reviewed  CBC - Abnormal; Notable for the following components:      Result Value   RBC 4.03 (*)    HCT 38.6 (*)    All other components within  normal limits  BASIC METABOLIC PANEL - Abnormal; Notable for the following components:   Calcium 8.7 (*)    All other components within normal limits  ETHANOL - Abnormal; Notable for the following components:   Alcohol, Ethyl (B) 262 (*)    All other components within normal limits  URINE DRUG SCREEN, QUALITATIVE ([facility] ONLY)   ____________________________________________  EKG  none  ____________________________________________  RADIOLOGY  none  ____________________________________________   PROCEDURES  Procedure(s) performed: None Procedures Critical Care performed:  None ____________________________________________   INITIAL IMPRESSION / ASSESSMENT AND PLAN / ED COURSE  58 y.o. male with a history of alcohol abuse, CAD, hypertension, hyperlipidemia, GERD who presents IVC for suicidal ideation, + EtOH intoxication.  Patient is pleasant, polite, denies SI or HI.  Reports  that his threats were because he was drunk and looking for attention.  Labs for medical clearance showing alcohol of 262 but no other abnormalities.  Will start patient on Librium to prevent patient from going into withdrawals.  Patient will remain IVC into evaluated by psychiatry.    _________________________ 4:16 AM on 12/14/2018 -----------------------------------------  Labs showing alcohol level of 262.  No other significant abnormalities.  Patient awaiting psychiatric evaluation   As part of my medical decision making, I reviewed the following data within the electronic MEDICAL RECORD NUMBER Nursing notes reviewed and incorporated, Labs reviewed , Old chart reviewed, A consult was requested and obtained from this/these consultant(s) Psychiatry, Notes from prior ED visits and [place] Controlled Substance Database    Pertinent labs & imaging results that were available during my care of the patient were reviewed by me and considered in my medical decision making (see chart for details).     ____________________________________________   FINAL CLINICAL IMPRESSION(S) / ED DIAGNOSES  Final diagnoses:  Alcoholic intoxication with complication (HCC)      NEW MEDICATIONS STARTED DURING THIS VISIT:  ED Discharge Orders    None       Note:  This document was prepared using Dragon voice recognition software and may include unintentional dictation errors.    Don Perking, [place], MD 12/14/18 [phone]

## 2018-12-14 NOTE — ED Notes (Signed)
BEHAVIORAL HEALTH ROUNDING Patient sleeping: No. Patient alert and oriented: yes Behavior appropriate: Yes.  ; If no, describe:  Nutrition and fluids offered: yes Toileting and hygiene offered: Yes  Sitter present: q15 minute observations and security camera monitoring Law enforcement present: Yes  ODS  

## 2018-12-14 NOTE — ED Notes (Signed)
Pt transferred into ED BHU room 8 Patient assigned to appropriate care area pending inpatient treatment bed to be assigned   Patient oriented to unit/care area: Informed that, for his safety, care areas are designed for safety and monitored by security cameras at all times;  phone times explained to patient. No visitors due to covid precautions  Patient verbalizes understanding, and verbal contract for safety obtained.   He denies pain  Assessment completed  Plan of care discussed  He is IVC

## 2018-12-14 NOTE — ED Notes (Addendum)

## 2018-12-14 NOTE — ED Notes (Signed)
Restless, standing in doorway. Maintained on 15 minute checks.

## 2018-12-14 NOTE — ED Notes (Signed)
Dr. Leverne Humbles at bedside with TTS. Maintained on 15 minute checks.

## 2018-12-14 NOTE — ED Notes (Signed)
IVC/ Consult Completed/ Inpt Admit Rec.

## 2018-12-14 NOTE — ED Notes (Signed)
Pt continues to ask when the psychiatrist will be here. RN assured patient it should be later this morning. Maintained on 15 minute checks.

## 2018-12-14 NOTE — ED Notes (Signed)
ED BHU [place] Is the patient under IVC or is there intent for IVC: Yes.   Is the patient medically cleared: Yes.   Is there vacancy in the ED BHU: Yes.   Is the population mix appropriate for patient: Yes.   Is the patient awaiting placement in inpatient or outpatient setting: Yes. awaiting inpt bed to be assigned    Has the patient had a psychiatric consult: Yes.   Survey of unit performed for contraband, proper placement and condition of furniture, tampering with fixtures in bathroom, shower, and each patient room: Yes.  ; Findings:  APPEARANCE/BEHAVIORanxious  and cooperative NEURO ASSESSMENT Orientation: oriented x3  Denies pain Hallucinations: No.None noted (Hallucinations) denies Speech: Normal Gait: normal RESPIRATORY ASSESSMENT Even  Unlabored respirations  CARDIOVASCULAR ASSESSMENT Pulses equal   regular rate  Skin warm and dry   GASTROINTESTINAL ASSESSMENT no GI complaint EXTREMITIES Full ROM  PLAN OF CARE Provide calm/safe environment. Vital signs assessed twice daily. ED BHU Assessment once each 12-hour shift. Collaborate with TTS daily or as condition indicates. Assure the ED provider has rounded once each shift. Provide and encourage hygiene. Provide redirection as needed. Assess for escalating behavior; address immediately and inform ED provider.  Assess family dynamic and appropriateness for visitation as needed: Yes.  ; If necessary, describe findings:  Educate the patient/family about BHU procedures/visitation: Yes.  ; If necessary, describe findings:

## 2018-12-14 NOTE — ED Notes (Signed)
Patient took po valium without any complaints, patient is very nervous and shaky, nurse explained to him that He would be going to [facility] and that the doctor had recommended in-treatment and He is upset and thinks that He can signs himself out, nurse let him know that was not possible, He is under IVC and He stayed calm, but states " let me speak to the doctor", Nurse told him that she would let Doctor know.

## 2018-12-14 NOTE — ED Notes (Signed)
Shower items provided  - he has completed his hygiene

## 2018-12-14 NOTE — BH Assessment (Signed)
Assessment Note  Dustin Velazquez is an 58 y.o. male who presents to the ER due to his family having concerns about his mental and emotional state. Patient states, "what I did was the dumbest thing I've done." He states, he and his wife recently separated due to his alcohol use and other problems. Patient admits to sending text messages saying he was going to end his life. Patient also reports he sent the messages with the the intent of getting "a pitty party" and "get my wife back."  Per the report of the patient's parents, they are concerned about his safety. The patient's alcohol consumption has increased and his behaviors has drastically changed.  They also report law enforcement have been involved four times within the last forty-eight hours.  One involvement was due to the patient going to the estranged wife's apartment complex intoxicated. The text messages he sent had pictures of a gun, suggesting he going to end his life.  This is the patient's second ER visit within the last forty-eight hours and the patient's family voice concern about him. He was recently inpatient with a rehab facility in [place]. At this time, it's unclear what happened for him to leave or be discharged.  During the patient was calm, cooperative and pleasant. He was able to provide answers to the questions. He minimized the different events that have occurred. While in the ER, family reports he have called then numerous times telling them to come get him because he was discharging, even after he was told he was going to be admitted.  Diagnosis: Major Depression & Alcohol Use Disorder  Past Medical History:  Past Medical History:  Diagnosis Date  . ETOH abuse   . GERD (gastroesophageal reflux disease)   . Hypercholesteremia   . Hypertension   . Sleep walking disorder     Past Surgical History:  Procedure Laterality Date  . APPENDECTOMY    . CORONARY STENT INTERVENTION N/A 12/30/2016   Procedure: Coronary Stent  Intervention;  Surgeon: Iran Ouch, MD;  Location: [facility] INVASIVE CV LAB;  Service: Cardiovascular;  Laterality: N/A;  . KNEE SURGERY    . LEFT HEART CATH AND CORONARY ANGIOGRAPHY N/A 12/30/2016   Procedure: Left Heart Cath and Coronary Angiography;  Surgeon: Iran Ouch, MD;  Location: [facility] INVASIVE CV LAB;  Service: Cardiovascular;  Laterality: N/A;    Family History:  Family History  Problem Relation Age of Onset  . Hypertension Mother   . Hyperlipidemia Father     Social History:  reports that he has been smoking cigarettes. He has a 7.50 pack-year smoking history. He has never used smokeless tobacco. He reports current alcohol use. He reports that he does not use drugs.  Additional Social History:  Alcohol / Drug Use Pain Medications: See PTA Prescriptions: See PTA Over the Counter: See PTA History of alcohol / drug use?: Yes Longest period of sobriety (when/how long): Unable quantify Negative Consequences of Use: Personal relationships, Work / School Withdrawal Symptoms: Nausea / Vomiting, Tremors Substance #1 Name of Substance 1: Alcohol 1 - Age of First Use: 15 1 - Amount (size/oz): "Half a pint of Tequila and eight beers" 1 - Duration: Unable to quantify 1 - Last Use / Amount: 12/13/2018  CIWA: CIWA-Ar BP: 134/85 Pulse Rate: 88 COWS:    Allergies:  Allergies  Allergen Reactions  . Compazine [Prochlorperazine Edisylate] Other (See Comments)    seizure    Home Medications: (Not in a hospital admission)   OB/GYN  Status:  No LMP for male patient.  General Assessment Data Location of Assessment: [facility] ED TTS Assessment: In system Is this a Tele or Face-to-Face Assessment?: Face-to-Face Is this an Initial Assessment or a Re-assessment for this encounter?: Initial Assessment Patient Accompanied by:: N/A Language Other than English: No Living Arrangements: Other (Comment)(Private Home) What gender do you identify as?: Male Marital status:  Separated Pregnancy Status: No Living Arrangements: Alone Can pt return to current living arrangement?: Yes Admission Status: Involuntary Petitioner: Police Is patient capable of signing voluntary admission?: No(Under IVC) Referral Source: Self/Family/Friend 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 : Yes Suicidal Intent: No Has patient had any suicidal intent within the past 6 months prior to admission? : No Is patient at risk for suicide?: Yes Suicidal Plan?: No-Not Currently/Within Last 6 Months Has patient had any suicidal plan within the past 6 months prior to admission? : Yes Access to Means: Yes Specify Access to Suicidal Means: told family he was going to shoot himself What has been your use of drugs/alcohol within the last 12 months?: Alcohol Previous Attempts/Gestures: No How many times?: 0 Other Self Harm Risks: Active Alcohol Abuse Triggers for Past Attempts: None known Intentional Self Injurious Behavior: None Family Suicide History: Unknown Recent stressful life event(s): Other (Comment), Divorce(Recently separated from wife) Persecutory voices/beliefs?: No Depression: Yes Depression Symptoms: Isolating, Guilt Substance abuse history and/or treatment for substance abuse?: Yes Suicide prevention information given to non-admitted patients: Not applicable  Risk to Others within the past 6 months Homicidal Ideation: No Does patient have any lifetime risk of violence toward  others beyond the six months prior to admission? : No Thoughts of Harm to Others: No Current Homicidal Intent: No Current Homicidal Plan: No Access to Homicidal Means: No Identified Victim: Reports of none History of harm to others?: No Assessment of Violence: None Noted Violent Behavior Description: Reports of none Does patient have access to weapons?: Yes (Comment) Criminal Charges Pending?: No Does patient have a court date: No Is patient on probation?: No  Psychosis Hallucinations: None noted Delusions: None noted  Mental Status Report Appearance/Hygiene: Unremarkable, In scrubs Eye Contact: Good Motor Activity: Freedom of movement, Unremarkable Speech: Logical/coherent, Unremarkable Level of Consciousness: Alert Mood: Depressed, Anxious, Sad, Pleasant Affect: Appropriate to circumstance, Anxious, Sad, Depressed Anxiety Level: Minimal Thought Processes: Coherent, Relevant Judgement: Unimpaired Orientation: Person, Place, Time, Situation, Appropriate for developmental age Obsessive Compulsive Thoughts/Behaviors: Minimal  Cognitive Functioning Concentration: Normal Memory: Recent Intact, Remote Intact Is patient IDD: No Insight: Fair Impulse Control: Fair Appetite: Good Have you had any weight changes? : No Change Sleep: No Change Total Hours of Sleep: 8 Vegetative Symptoms: None  ADLScreening [facility] Assessment Services) Patient's cognitive ability adequate to safely complete daily activities?: Yes Patient able to express need for assistance with ADLs?: Yes Independently performs ADLs?: Yes (appropriate for developmental age)  Prior Inpatient Therapy Prior Inpatient Therapy: Yes Prior Therapy Dates: 12/2018 Prior Therapy Facilty/Provider(s): Treatment Facility in [place] Reason for Treatment: Alcohol Detox and Substance Abuse Treatment  Prior Outpatient Therapy Prior Outpatient  Therapy: Yes Prior Therapy Dates: 08/2018 Prior Therapy Facilty[facility]Provider([facility]): [facility] Reason for Treatment: Substance Abuse Disorder Does patient have an ACCT team?: No Does patient have Intensive In-House Services?  : No Does patient have [facility] services? : No Does patient have P4CC services?: No  ADL Screening (condition at time of admission) Patient's cognitive ability adequate to safely complete daily activities?: Yes Is the patient deaf or have difficulty hearing?: No Does the patient have difficulty seeing, even when wearing glasses/contacts?: No Does the patient have difficulty concentrating, remembering, or making decisions?: No Patient able to express need for assistance with ADLs?: Yes Does the patient have difficulty dressing or bathing?: No Independently performs ADLs?: Yes (appropriate for developmental age) Does the patient have difficulty walking or climbing stairs?: No Weakness of Legs: None Weakness of Arms/Hands: None  Home Assistive Devices/Equipment Home Assistive Devices/Equipment: None  Therapy Consults (therapy consults require a physician order) PT Evaluation Needed: No OT Evalulation Needed: No SLP Evaluation Needed: No Abuse/Neglect Assessment (Assessment to be complete while patient is alone) Abuse/Neglect Assessment Can Be Completed: Yes Physical Abuse: Denies Verbal Abuse: Denies Sexual Abuse: Denies Exploitation of patient/patient's resources: Denies Self-Neglect: Denies Values / Beliefs Cultural Requests During Hospitalization: None Spiritual Requests During Hospitalization: None Consults Spiritual Care Consult Needed: No Social Work Consult Needed: No Merchant navy officerAdvance Directives (For Healthcare) Does Patient Have a Medical Advance Directive?: No Would patient like information on creating a medical advance directive?: No - Patient declined Nutrition Screen- [facility] Adult/WL/AP Patient's home diet: Regular     Child/Adolescent Assessment Running Away Risk: Denies(Patient is an adult)  Disposition:   Disposition Initial Assessment Completed for this Encounter: Yes  On Site Evaluation by:   Reviewed with Physician:    Lilyan Gilfordalvin J. Akif Weldy MS, LCAS, [facility], NCC, CCSI Therapeutic Triage Specialist 12/14/2018 12:51 PM

## 2018-12-14 NOTE — Consult Note (Addendum)
[facility] Face-to-Face Psychiatry Consult   Reason for Consult:  Suicidal with a gun Referring Physician:  Dr. Mayford Knife Patient Identification: Dustin Velazquez MRN:  [id number] Principal Diagnosis: Suicidal behavior Diagnosis:  Principal Problem:   Suicidal behavior Active Problems:   Alcohol abuse  Patient is seen, chart is reviewed, collateral obtained from patient's parents Dustin Velazquez and Dustin Velazquez [phone]) Total Time spent with patient: 1 hour  Subjective:  "My dad has my gun."  Goes by "[name]"   HPI: Dustin Velazquez is a 58 y.o. male patient with a history of alcohol abuse, CAD, hypertension, hyperlipidemia, GERD who presents IVC for suicidal ideation.  Patient reports that him and his wife separated 4 months ago.  He had been calling her since yesterday wanting to see her.  She was ignoring his phone calls and messages.  Today he sent her a text with a picture of him with a pistol on his head.  She called 911 and patient was IVC and brought to the emergency room.  Patient tells me "I never had any thoughts about hurting myself, was just being stupid". He said "I got angry because she was not answering my phone calls and decided to drink."  He denies SI or HI.  Denies any prior history of suicide attempt.  He denies depression.  He reports that he drinks anywhere between 6-12 beers a day and several shots of tequila.  Last alcohol was prior to arrival.  Patient reports being on detox for 7 days couple weeks ago in [place].  He denies history of complicated withdrawals.  He denies any medical complaints.  On evaluation, patient is calm and cooperative.  He is anxious to attempt to discharge from the hospital.  He states he "made the biggest mistake of my life" insinuating that he should not have taken a picture of himself with a gun to his head.  He reports that he does not think he would have pulled the trigger, because he "loves himself too much."  Patient describes that his father now has his  gun.  He reports that he is intending to get treatment at [facility] through his employer with the city of [place].  He also notes that he would work on having outpatient therapy with a [employer] provider.  Of note, patient made similar contract for safety approximately 2 days ago and was discharged from the emergency room, and then was brought back to the emergency room with intoxication and suicidal intent the next day.  On his own, patient states "I do not intend to harm my wife, I am trying to get back with her."  Patient denies any auditory and visual hallucinations.  Patient minimizes the effects of his alcohol use, stating he drinks 6-7 beers a day.  He does acknowledge that this has caused a problem in his marriage.  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  Patient reports that he has been separated for 3 to 4 months from his wife of 17 years, this is been a significant stressor for him.  He denies past psychiatric admissions, or past psychiatric history.  Collateral from mother and father Dustin Velazquez and Dustin Velazquez [phone]) Police have been to his apartment 4 times for suicidal ideation.  He has been stalking ex-wife and driving drunk. Parents describe patient has been having erratic and dangerous behaviors. Saturday- threatening suicide with a  gun to his head.  Wife called Sunday because he called drunk to get her to come to the apartment or he would kill self. Parents are not able to ensure that they can help maintain patient safety as he lives alone.  They describe that he has indicated that he is getting treatment, however he continually calls them with suicidal ideation.  Past Psychiatric History: Alcohol use disorder, dependence  Risk to Self:  Yes Risk to Others:  Yes Prior Inpatient Therapy:  None Prior Outpatient Therapy:  Patient states he received a therapy session through [facility]  approximately 2 months ago.  Past Medical History:  Past Medical History:  Diagnosis Date  . ETOH abuse   . GERD (gastroesophageal reflux disease)   . Hypercholesteremia   . Hypertension   . Sleep walking disorder     Past Surgical History:  Procedure Laterality Date  . APPENDECTOMY    . CORONARY STENT INTERVENTION N/A 12/30/2016   Procedure: Coronary Stent Intervention;  Surgeon: Iran OuchArida, Muhammad A, MD;  Location: [facility] INVASIVE CV LAB;  Service: Cardiovascular;  Laterality: N/A;  . KNEE SURGERY    . LEFT HEART CATH AND CORONARY ANGIOGRAPHY N/A 12/30/2016   Procedure: Left Heart Cath and Coronary Angiography;  Surgeon: Iran OuchArida, Muhammad A, MD;  Location: [facility] INVASIVE CV LAB;  Service: Cardiovascular;  Laterality: N/A;   Family History:  Family History  Problem Relation Age of Onset  . Hypertension Mother   . Hyperlipidemia Father    Family Psychiatric  History: None indicated  Social History:  Social History   Substance and Sexual Activity  Alcohol Use Yes     Social History   Substance and Sexual Activity  Drug Use No    Social History   Socioeconomic History  . Marital status: Married    Spouse name: Not on file  . Number of children: Not on file  . Years of education: Not on file  . Highest education level: Not on file  Occupational History    Employer: [employer]  Social Needs  . Financial resource strain: Not on file  . Food insecurity:    Worry: Not on file    Inability: Not on file  . Transportation needs:    Medical: Not on file    Non-medical: Not on file  Tobacco Use  . Smoking status: Current Every Day Smoker    Packs/day: 0.25    Years: 30.00    Pack years: 7.50    Types: Cigarettes  . Smokeless tobacco: Never Used  Substance and Sexual Activity  . Alcohol use: Yes  . Drug use: No  . Sexual activity: Not on file  Lifestyle  . Physical activity:    Days per week: Not on file    Minutes per session: Not on file  . Stress: Not on  file  Relationships  . Social connections:    Talks on phone: Not on file    Gets together: Not on file    Attends religious service: Not on file    Active member of club or organization: Not on file    Attends meetings of clubs or organizations: Not on file    Relationship status: Not on file  Other Topics Concern  . Not on file  Social History Narrative  . Not on file   Additional Social History:  Patient lives alone, he does have access to weapons.  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  He has  been employed for 14 years in this position.  Patient denies any substance use for the past 16 years.  Reports using marijuana regularly and cocaine on weekends 16 years ago.  Allergies:   Allergies  Allergen Reactions  . Compazine [Prochlorperazine Edisylate] Other (See Comments)    seizure    Labs:  Results for orders placed or performed during the hospital encounter of 12/14/18 (from the past 48 hour(s))  CBC     Status: Abnormal   Collection Time: 12/13/18 11:54 PM  Result Value Ref Range   WBC 7.2 4.0 - 10.5 K/uL   RBC 4.03 (L) 4.22 - 5.81 MIL/uL   Hemoglobin 13.1 13.0 - 17.0 g/dL   HCT 16.138.6 (L) 09.639.0 - 04.552.0 %   MCV 95.8 80.0 - 100.0 fL   MCH 32.5 26.0 - 34.0 pg   MCHC 33.9 30.0 - 36.0 g/dL   RDW 40.914.1 81.111.5 - 91.415.5 %   Platelets 332 150 - 400 K/uL   nRBC 0.0 0.0 - 0.2 %    Comment: Performed at [facility], [address] [place], [place] [address]  Basic metabolic panel     Status: Abnormal   Collection Time: 12/13/18 11:54 PM  Result Value Ref Range   Sodium 144 135 - 145 mmol/L   Potassium 3.8 3.5 - 5.1 mmol/L   Chloride 109 98 - 111 mmol/L   CO2 24 22 - 32 mmol/L   Glucose, Bld 95 70 - 99 mg/dL   BUN 11 6 - 20 mg/dL   Creatinine, Ser 6.210.77 0.61 - 1.24 mg/dL   Calcium 8.7 (L) 8.9 - 10.3 mg/dL   GFR calc non Af Amer >60 >60 mL/min   GFR calc Af Amer >60 >60 mL/min   Anion gap 11 5 - 15    Comment: Performed at  [facility], [address] [place], [place] [address]  Ethanol     Status: Abnormal   Collection Time: 12/13/18 11:54 PM  Result Value Ref Range   Alcohol, Ethyl (B) 262 (H) <10 mg/dL    Comment: (NOTE) Lowest detectable limit for serum alcohol is 10 mg/dL. For medical purposes only. Performed at [facility], [address] [place], [place] [address]   Urine Drug Screen, Qualitative [facility] only)     Status: Abnormal   Collection Time: 12/14/18  1:36 AM  Result Value Ref Range   Tricyclic, Ur Screen NONE DETECTED NONE DETECTED   Amphetamines, Ur Screen NONE DETECTED NONE DETECTED   MDMA (Ecstasy)Ur Screen NONE DETECTED NONE DETECTED   Cocaine Metabolite,Ur [place] NONE DETECTED NONE DETECTED   Opiate, Ur Screen NONE DETECTED NONE DETECTED   Phencyclidine (PCP) Ur S NONE DETECTED NONE DETECTED   Cannabinoid 50 Ng, Ur [place] NONE DETECTED NONE DETECTED   Barbiturates, Ur Screen NONE DETECTED NONE DETECTED   Benzodiazepine, Ur Scrn POSITIVE (A) NONE DETECTED   Methadone Scn, Ur NONE DETECTED NONE DETECTED    Comment: (NOTE) Tricyclics + metabolites, urine    Cutoff 1000 ng/mL Amphetamines + metabolites, urine  Cutoff 1000 ng/mL MDMA (Ecstasy), urine              Cutoff 500 ng/mL Cocaine Metabolite, urine          Cutoff 300 ng/mL Opiate + metabolites, urine        Cutoff 300 ng/mL Phencyclidine (PCP), urine         Cutoff 25 ng/mL Cannabinoid, urine  Cutoff 50 ng/mL Barbiturates + metabolites, urine  Cutoff 200 ng/mL Benzodiazepine, urine              Cutoff 200 ng/mL Methadone, urine                   Cutoff 300 ng/mL The urine drug screen provides only a preliminary, unconfirmed analytical test result and should not be used for non-medical purposes. Clinical consideration and professional judgment should be applied to any positive drug screen result due to possible interfering substances. A more specific alternate chemical method must be used in  order to obtain a confirmed analytical result. Gas chromatography / mass spectrometry (GC/MS) is the preferred confirmat ory method. Performed at [facility], [address] [place], [place] [address]     Current Facility-Administered Medications  Medication Dose Route Frequency Provider Last Rate Last Dose  . chlordiazePOXIDE (LIBRIUM) capsule 50 mg  50 mg Oral TID Nita SickleVeronese, [place], MD   50 mg at 12/14/18 [id number]   Current Outpatient Medications  Medication Sig Dispense Refill  . aspirin EC 81 MG tablet Take 81 mg by mouth daily.    Marland Kitchen. gabapentin (NEURONTIN) 300 MG capsule Take 1 capsule (300 mg total) by mouth every 6 (six) hours. 120 capsule 2  . lisinopril (PRINIVIL,ZESTRIL) 40 MG tablet Take 40 mg by mouth daily.  98  . metoprolol tartrate (LOPRESSOR) 50 MG tablet TAKE ONE TABLET BY MOUTH TWICE DAILY 60 tablet 8  . omeprazole (PRILOSEC) 20 MG capsule Take 20 mg by mouth daily.  2  . rosuvastatin (CRESTOR) 40 MG tablet Take 1 tablet (40 mg total) by mouth daily. 90 tablet 3  . ticagrelor (BRILINTA) 60 MG TABS tablet Take 1 tablet (60 mg total) by mouth 2 (two) times daily. 180 tablet 3    Musculoskeletal: Strength & Muscle Tone: within normal limits Gait & Station: normal Patient leans: N/A  Psychiatric Specialty Exam: Physical Exam  Nursing note and vitals reviewed. Constitutional: He is oriented to person, place, and time. He appears well-developed and well-nourished. No distress.  HENT:  Head: Normocephalic and atraumatic.  Eyes: EOM are normal.  Neck: Normal range of motion.  Cardiovascular: Normal rate and regular rhythm.  Respiratory: Effort normal. No respiratory distress.  Musculoskeletal: Normal range of motion.  Neurological: He is alert and oriented to person, place, and time.    Review of Systems  Constitutional: Negative.   Respiratory: Negative.   Cardiovascular: Negative.   Musculoskeletal: Negative.   Neurological: Negative.    Psychiatric/Behavioral: Positive for substance abuse (alcohol) and suicidal ideas. Negative for depression, hallucinations and memory loss. The patient is nervous/anxious. The patient does not have insomnia.     Blood pressure 134/85, pulse 88, temperature 98.3 F (36.8 C), resp. rate 18, height 5\' 10"  (1.778 m), weight 84.8 kg, SpO2 96 %.Body mass index is 26.83 kg/m.  General Appearance: Casual  Eye Contact:  Good  Speech:  Clear and Coherent and Normal Rate  Volume:  Normal  Mood:  Anxious and Depressed  Affect:  Congruent  Thought Process:  Goal Directed  Orientation:  Full (Time, Place, and Person)  Thought Content:  Logical and Hallucinations: None  Suicidal Thoughts:  Yes.  with intent/plan  Homicidal Thoughts:  No  Memory:  good  Judgement:  Impaired  Insight:  Shallow  Psychomotor Activity:  Normal  Concentration:  Concentration: Good  Recall:  Good  Fund of Knowledge:  Good  Language:  Good  Akathisia:  No  Handed:  Right  AIMS (if indicated):      Assets:  Agricultural consultant Housing Vocational/Educational  ADL's:  Intact  Cognition:  WNL  Sleep:   ok     Treatment Plan Summary: Daily contact with patient to assess and evaluate symptoms and progress in treatment and Continue involuntary.  High risk for suicide given age, gender, alcoholism, life stressors, isolation, and access to weapon with ideation/intent to complete suicide.  Disposition: Recommend psychiatric Inpatient admission when medically cleared.  Lavella Hammock, MD 12/14/2018 10:04 AM

## 2018-12-14 NOTE — Discharge Instructions (Signed)
You have been seen in the Emergency Department (ED)  today for a psychiatric complaint.  You have been evaluated by psychiatry and we believe you are safe to be discharged from the hospital.   ° °Please return to the Emergency Department (ED)  immediately if you have ANY thoughts of hurting yourself or anyone else, so that we may help you. ° °Please avoid alcohol and drug use. ° °Follow up with your doctor and/or therapist as soon as possible regarding today's ED  visit.  ° °You may call crisis hotline for [place] at [phone]. ° °

## 2018-12-14 NOTE — BH Assessment (Signed)
Patient is to be admitted to [facility] by Dr. Leverne Humbles.  Attending Physician will be Dr. Weber Cooks.   Patient has been assigned to room 303, by [facility] Charge Nurse T'Yawn.   ER staff is aware of the admission:  Dr. Jimmye Norman, ER MD   Amy T., Patient's Nurse   Butch Penny, Patient Access.

## 2018-12-15 DIAGNOSIS — F332 Major depressive disorder, recurrent severe without psychotic features: Secondary | ICD-10-CM

## 2018-12-15 MED ORDER — CITALOPRAM HYDROBROMIDE 20 MG PO TABS
20.0000 mg | ORAL_TABLET | Freq: Every day | ORAL | Status: DC
  Administered 2018-12-15: 20 mg via ORAL
  Filled 2018-12-15: qty 1

## 2018-12-15 NOTE — BHH Counselor (Signed)
Adult Comprehensive Assessment  Patient ID: NORI POLAND, male   DOB: 05-10-1961, 58 y.o.   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 1, 2020 What is atmosphere in current home: Comfortable  Family History:  Marital status: Separated Separated, when?: 4 months ago Additional relationship information: Pt reports he has been separated for 4 months What is your sexual orientation?: heterosexual Has your sexual activity been affected by drugs, alcohol, medication, or emotional stress?: Pt denies Does patient have children?: Yes How many children?: 1 How is patient's relationship with their children?: 83 yo daughter who pt reports he does not have the best relationship with due to the daughter's mother turning the child against him  Childhood History:  By whom was/is the patient raised?: Both parents Description of patient's relationship with caregiver when they were a child: "I couldn't have asked for a better relationship" Patient's description of current relationship with people who raised him/her: "great" How were you disciplined when you got in trouble as a child/adolescent?: "spanked" Does patient have  siblings?: Yes Number of Siblings: 1 Description of patient's current relationship with siblings: older brother who pt reports a good relationship with Did patient suffer any verbal/emotional/physical/sexual abuse as a child?: No Did patient suffer from severe childhood neglect?: No Has patient ever been sexually abused/assaulted/raped as an adolescent or adult?: No Was the patient ever a victim of a crime or a disaster?: No Witnessed domestic violence?: No Has patient been effected by domestic violence as an adult?: No  Education:  Highest grade of school patient has completed: high school diploma Currently a Ship broker?: No Learning disability?: No  Employment/Work Situation:   Employment situation: Employed Where is patient currently employed?: [place] How long has patient been employed?: 15 years Patient's job has been impacted by current illness: No What is the longest time patient has a held a job?: 15 years Where was the patient employed at that time?: [place] Did You Receive Any Psychiatric Treatment/Services While in the [employer]?: No Are There Guns or Other Weapons in [place]?: No(Pt reports he had a pistol, but due to the incident his father now has the gun) Are These Psychologist, educational?: Yes  Financial Resources:   Financial resources: Income from employment, Private insurance Does patient have a representative payee or guardian?: No  Alcohol/Substance Abuse:   What has been your use of drugs/alcohol within the last 12 months?: pt denies Alcohol/Substance Abuse Treatment Hx: Denies past history Has alcohol/substance abuse ever caused legal problems?: No  Social Support System:   Pensions consultant Support System: Good Describe Community Support System: parents and friends Type of faith/religion: International aid/development worker:   Leisure and Hobbies: "fishing, hanging out with my buddies, cleaning cars"  Strengths/Needs:   What is the  patient's  perception of their strengths?: "working, making friends" Patient states they can use these personal strengths during their treatment to contribute to their recovery: "I know I did something stupid and that it wasn't funny" Patient states these barriers may affect/interfere with their treatment: pt denies Other important information patient would like considered in planning for their treatment: Pt reports he will seek out [facility] on his own as he gets 6 free sessions through his job  Discharge Plan:   Currently receiving community mental health services: No Patient states concerns and preferences for aftercare planning are: PT reports he will set up his own follow up through his job and insurance Patient states they will know when they are safe and ready for discharge when: "I know it in the bottom of my heart" Does patient have access to transportation?: Yes Does patient have financial barriers related to discharge medications?: No Will patient be returning to same living situation after discharge?: Yes  Summary/Recommendations:   Summary and Recommendations (to be completed by the evaluator): Pt is a 58 yo male living [facility]  [place] [place] [facility]. Pt presents to the hospital seeking treatment for suicide ideations. Pt is currently separated of 4 months, living alone, employed, has one child, and a good support system. Pt is agreeable to set up his own follow up appointments through his job and [employer]. Pt denies any abuse or trauma and denies SI/HI/AVH currently. Recommendations for pt include: crisis stabilization, therapeutic milieu, encourage group attendance and participation, medication management for mood stabilization, and development for comprehensive mental wellness plan. CSW assessing for appropriate referrals.   Charlann Lange Emmarose Klinke MSW LCSW 12/15/2018 10:01 AM

## 2018-12-15 NOTE — BHH Suicide Risk Assessment (Signed)
[facility] INPATIENT:  Family/Significant Other Suicide Prevention Education  Suicide Prevention Education:  Patient Refusal for Family/Significant Other Suicide Prevention Education: The patient Dustin Velazquez has refused to provide written consent for family/significant other to be provided Family/Significant Other Suicide Prevention Education during admission and/or prior to discharge.  Physician notified.  SPE completed with pt, as pt refused to consent to family contact. SPI pamphlet provided to pt and pt was encouraged to share information with support network, ask questions, and talk about any concerns relating to SPE. Pt denies access to guns/firearms and verbalized understanding of information provided. Mobile Crisis information also provided to pt.   [place] MSW LCSW 12/15/2018, 9:47 AM

## 2018-12-15 NOTE — Tx Team (Signed)
Initial Treatment Plan 12/15/2018 12:17 AM CELSO GRANJA TZG:[id number]    PATIENT STRESSORS: Medication change or noncompliance Substance abuse   PATIENT STRENGTHS: Ability for insight Motivation for treatment/growth Religious Affiliation Supportive family/friends   PATIENT IDENTIFIED PROBLEMS Substance Abuse     Depression     Suicidal Ideation              DISCHARGE CRITERIA:  Improved stabilization in mood, thinking, and/or behavior Medical problems require only outpatient monitoring Motivation to continue treatment in a less acute level of care  PRELIMINARY DISCHARGE PLAN: Attend 12-step recovery group Outpatient therapy Participate in family therapy  PATIENT/FAMILY INVOLVEMENT: This treatment plan has been presented to and reviewed with the patient, SONNIE PAWLOSKI, The patient and family have been given the opportunity to ask questions and make suggestions.  Harl Bowie, RN 12/15/2018, 12:17 AM

## 2018-12-15 NOTE — BHH Group Notes (Signed)
Feelings Around Diagnosis 12/15/2018 1PM  Type of Therapy/Topic:  Group Therapy:  Feelings about Diagnosis  Participation Level:  Active   Description of Group:   This group will allow patients to explore their thoughts and feelings about diagnoses they have received. Patients will be guided to explore their level of understanding and acceptance of these diagnoses. Facilitator will encourage patients to process their thoughts and feelings about the reactions of others to their diagnosis and will guide patients in identifying ways to discuss their diagnosis with significant others in their lives. This group will be process-oriented, with patients participating in exploration of their own experiences, giving and receiving support, and processing challenge from other group members.   Therapeutic Goals: 1. Patient will demonstrate understanding of diagnosis as evidenced by identifying two or more symptoms of the disorder 2. Patient will be able to express two feelings regarding the diagnosis 3. Patient will demonstrate their ability to communicate their needs through discussion and/or role play  Summary of Patient Progress:  Actively and appropriately engaged in the group. Patient was able to provide support and validation to other group members. Patient discussed his battle with alcoholism and the conflict it caused in his relationship with his significant other. Patient demonstrated insight and respected boundaries during group session.       Therapeutic Modalities:   Cognitive Behavioral Therapy Brief Therapy Feelings Identification    Yvette Rack, LCSW 12/15/2018 2:36 PM

## 2018-12-15 NOTE — Progress Notes (Signed)
Admission Note:  57 yr male who presents IVC in distress upon admission to the unit. Patient was noted anxious , restless, shaking with some tremors noted in both hands. Pt appears flushed, and he explained " I may be experiencing some withdrawals. The interview was halted and immediately he was given some medication for Alcohol withdrawals. Patient stated  " I last had a drink 24 hours ago"  He was given fluids, but he refused eating at this time.   Patient later was calm and cooperative with admission process, he currently denies SI/HI/AVH he stated " l did something stupid ; sent my  estranged wife a picture of me with a gun to my head, I really didn't mean it but it was the only way of getting her attention because she won't return my calls"  Patient now feels he was wrong and appears to have some insight into his treatment plan. Patient has past hx of  Myocardial infarction, Suicide behavior, HTN, Hypercholesteremia, and ETOH Abuse. Patient is currently on CIWA every 6 hours.   Patient's skin was assessed in presence of Matthew RN, warm, dry and intact, in addition he was searched and no contraband found, Plan of care and unit policies explained and understanding verbalized, consents was obtained. Patient was oriented to the unit, he was offered fluids and Gatorade for hydration and both accepted, 15 minutes safety checks maintained will continue to closely monitor

## 2018-12-15 NOTE — BHH Suicide Risk Assessment (Signed)
[facility] Admission Suicide Risk Assessment    Nursing information obtained from:  Patient Demographic factors:  Male, Caucasian, Living alone, Divorced or widowed, Access to firearms, Low socioeconomic status Current Mental Status:  NA Loss Factors:  Decline in physical health, Loss of significant relationship Historical Factors:  Impulsivity Risk Reduction Factors:  Positive therapeutic relationship, Positive social support  Total Time spent with patient: 1 hour Principal Problem: Severe recurrent major depression without psychotic features (HCC) Diagnosis:  Principal Problem:   Severe recurrent major depression without psychotic features (HCC) Active Problems:   Alcohol abuse   Suicidal behavior  Subjective Data: Patient seen chart reviewed.  Patient admitted to the hospital after reportedly texting a picture of himself holding a gun to his head to his estranged wife.  Patient is denying that he had any suicidal intention or thought.  Continues to claim that it was "a joke".  Admits to drinking but minimizes the degree to which it was a problem.  Denies any psychotic symptoms.  Denies homicidal ideation.  Continued Clinical Symptoms:  Alcohol Use Disorder Identification Test Final Score (AUDIT): 26 The "Alcohol Use Disorders Identification Test", Guidelines for Use in Primary Care, Second Edition.  World Science writer [facility]). Score between 0-7:  no or low risk or alcohol related problems. Score between 8-15:  moderate risk of alcohol related problems. Score between 16-19:  high risk of alcohol related problems. Score 20 or above:  warrants further diagnostic evaluation for alcohol dependence and treatment.   CLINICAL FACTORS:   Depression:   Impulsivity Alcohol/Substance Abuse/Dependencies   Musculoskeletal: Strength & Muscle Tone: within normal limits Gait & Station: normal Patient leans: N/A  Psychiatric Specialty Exam: Physical Exam  Nursing note and vitals  reviewed. Constitutional: He appears well-developed and well-nourished.  HENT:  Head: Normocephalic and atraumatic.  Eyes: Pupils are equal, round, and reactive to light. Conjunctivae are normal.  Neck: Normal range of motion.  Cardiovascular: Regular rhythm and normal heart sounds.  Respiratory: Effort normal. No respiratory distress.  GI: Soft.  Musculoskeletal: Normal range of motion.  Neurological: He is alert.  Skin: Skin is warm and dry.  Psychiatric: His mood appears anxious. His speech is rapid and/or pressured. He is agitated. He is not aggressive. Thought content is not paranoid. Cognition and memory are impaired. He expresses impulsivity and inappropriate judgment. He expresses suicidal ideation. He expresses no homicidal ideation.    Review of Systems  Constitutional: Negative.   HENT: Negative.   Eyes: Negative.   Respiratory: Negative.   Cardiovascular: Negative.   Gastrointestinal: Negative.   Musculoskeletal: Negative.   Skin: Negative.   Neurological: Negative.   Psychiatric/Behavioral: Positive for depression, memory loss and substance abuse. Negative for hallucinations and suicidal ideas. The patient is nervous/anxious.     Blood pressure (!) 152/96, pulse 84, temperature 98.7 F (37.1 C), temperature source Oral, resp. rate 18, height 5\' 5"  (1.651 m), weight 89.8 kg, SpO2 97 %.Body mass index is 32.95 kg/m.  General Appearance: Casual  Eye Contact:  Fair  Speech:  Clear and Coherent and Pressured  Volume:  Increased  Mood:  Anxious  Affect:  Congruent  Thought Process:  Coherent  Orientation:  Full (Time, Place, and Person)  Thought Content:  Illogical  Suicidal Thoughts:  No  Homicidal Thoughts:  No  Memory:  Immediate;   Fair Recent;   Fair Remote;   Fair  Judgement:  Impaired  Insight:  Shallow  Psychomotor Activity:  Normal  Concentration:  Concentration: Fair  Recall:  [place] of Knowledge:  Fair  Language:  Fair  Akathisia:  No  Handed:   Right  AIMS (if indicated):     Assets:  Agricultural consultant Housing Social Support  ADL's:  Intact  Cognition:  Impaired,  Mild  Sleep:  Number of Hours: 4.5      COGNITIVE FEATURES THAT CONTRIBUTE TO RISK:  Closed-mindedness and Loss of executive function    SUICIDE RISK:   Mild:  Suicidal ideation of limited frequency, intensity, duration, and specificity.  There are no identifiable plans, no associated intent, mild dysphoria and related symptoms, good self-control (both objective and subjective assessment), few other risk factors, and identifiable protective factors, including available and accessible social support.  PLAN OF CARE: Patient admitted to the psychiatric ward.  Continue 15-minute checks.  Include patient in individual and group therapy.  Work on psychoeducation and encouragement to see his problems for what they are and agree to some kind of appropriate treatment plan.  Reassess suicidality in an ongoing way prior to discharge.  I certify that inpatient services furnished can reasonably be expected to improve the patient's condition.   Alethia Berthold, MD 12/15/2018, 3:45 PM

## 2018-12-15 NOTE — Plan of Care (Signed)
Patient  instructed on [facility] Education , unit programing , able to verbalize understanding of information received . Patient  working on coping , decisions  making , anger management and    anxiety issues .  Attending unit programing . Voice no concerns around wake  times and sleep.  No  evidence of frustration.   Patient voice no concerns around  safety . Emotional and mental  status improved . Compliant with medication , able to verbalize  importance . Patient denies suicidal ideations . Problem: Education: Goal: Knowledge of [place] [facility] Education information/materials will improve Outcome: Progressing Goal: Emotional status will improve Outcome: Progressing Goal: Mental status will improve Outcome: Progressing Goal: Verbalization of understanding the information provided will improve Outcome: Progressing   Problem: Activity: Goal: Interest or engagement in activities will improve Outcome: Progressing Goal: Sleeping patterns will improve Outcome: Progressing   Problem: Coping: Goal: Ability to verbalize frustrations and anger appropriately will improve Outcome: Progressing Goal: Ability to demonstrate self-control will improve Outcome: Progressing   Problem: Safety: Goal: Periods of time without injury will increase Outcome: Progressing   Problem: Education: Goal: Knowledge of [place] Education information/materials will improve Outcome: Progressing Goal: Emotional status will improve Outcome: Progressing Goal: Mental status will improve Outcome: Progressing Goal: Verbalization of understanding the information provided will improve Outcome: Progressing   Problem: Activity: Goal: Interest or engagement in activities will improve Outcome: Progressing Goal: Sleeping patterns will improve Outcome: Progressing   Problem: Safety: Goal: Periods of time without injury will increase Outcome: Progressing   Problem: Education: Goal: Ability to make  informed decisions regarding treatment will improve Outcome: Progressing   Problem: Self-Concept: Goal: Ability to disclose and discuss suicidal ideas will improve Outcome: Progressing Goal: Will verbalize positive feelings about self Outcome: Progressing   Problem: Medication: Goal: Compliance with prescribed medication regimen will improve Outcome: Progressing
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
## 2018-12-15 NOTE — Progress Notes (Signed)
.  D: Patient stated slept good last night .Stated appetite fair and energy level  Is normal. Stated concentration is good . Stated on Depression scale  0, hopeless 0 and anxiety 0 .( low 0-10 high) Denies suicidal  homicidal ideations .  Yes auditory hallucinations  No pain concerns . Appropriate ADL'S. Interacting with peers and staff. Patient  instructed on [facility] Education , unit programing , able to verbalize understanding of information received . Patient  working on coping , decisions  making , anger management and  anxiety issues .  Attending unit programing . Voice no concerns around wake  times and sleep.  No  evidence of frustration.   Patient voice no concerns around  safety . Emotional and mental  status improved . Compliant with medication , able to verbalize  importance . Patient denies suicidal ideations . Focus goal Going Home     A: Encourage patient participation with unit programming  . Instruction  Given on  Medication , verbalize understanding.   R: Voice no other concerns. Staff continue to monitor

## 2018-12-15 NOTE — Progress Notes (Signed)
Recreation Therapy Notes  Date: 12/15/2018  Time: 9:30 am  Location: Craft room  Behavioral response: Appropriate  Intervention Topic: Happiness  Discussion/Intervention:  Group content today was focused on Happiness. The group defined happiness and described where happiness comes from. Individuals identified what makes them happy and how they go about making others happy. Patients expressed things that stop them from being happy and ways they can improve their happiness. The group stated reasons why it is important to be happy. The group participated in the intervention "My Happiness", where they had a chance to identify and express things that make them happy. Clinical Observations/Feedback:  Patient came to group and stated his family, swimming, and cleaning cars are things that make him happy. Participant stated drugs and drinks are things that made him unhappy even though he thought it was making him happy at the time. He shared how he learned a lot getting older and expressed how important his family is to him and that makes him happy. Individual was social with peers and staff while participating in the intervention. Dustin Velazquez LRT/CTRS         [name] 12/15/2018 11:45 AM

## 2018-12-16 MED ORDER — METOPROLOL TARTRATE 50 MG PO TABS: 50.0000 mg | ORAL_TABLET | Freq: Two times a day (BID) | ORAL | 1 refills | Status: DC

## 2018-12-16 MED ORDER — TICAGRELOR 60 MG PO TABS: 60.0000 mg | ORAL_TABLET | Freq: Two times a day (BID) | ORAL | 1 refills | Status: DC

## 2018-12-16 MED ORDER — CITALOPRAM HYDROBROMIDE 20 MG PO TABS: 20.0000 mg | ORAL_TABLET | Freq: Every day | ORAL | 1 refills | Status: DC

## 2018-12-16 MED ORDER — PANTOPRAZOLE SODIUM 40 MG PO TBEC: 40.0000 mg | DELAYED_RELEASE_TABLET | Freq: Every day | ORAL | 1 refills | Status: DC

## 2018-12-16 MED ORDER — ROSUVASTATIN CALCIUM 40 MG PO TABS: 40.0000 mg | ORAL_TABLET | Freq: Every day | ORAL | 1 refills | Status: DC

## 2018-12-16 MED ORDER — ASPIRIN EC 81 MG PO TBEC: 81.0000 mg | DELAYED_RELEASE_TABLET | Freq: Every day | ORAL | 1 refills | Status: DC

## 2018-12-16 MED ORDER — LISINOPRIL 40 MG PO TABS: 40.0000 mg | ORAL_TABLET | Freq: Every day | ORAL | 1 refills | Status: DC

## 2018-12-16 NOTE — Discharge Summary (Signed)
Physician Discharge Summary Note  Patient:  Dustin PitchMichael L Kuba is an 58 y.o., male MRN:  [id number] DOB:  1961-07-02 Patient phone:  [phone] (home)  Patient address:   [address] [place] [id number],  Total Time spent with patient: 45 minutes  Date of Admission:  12/14/2018 Date of Discharge: December 16, 2018  Reason for Admission: Admitted because of intoxication and reports that he had made threatening text messages  Principal Problem: Severe recurrent major depression without psychotic features [facility](HCC) Discharge Diagnoses: Principal Problem:   Severe recurrent major depression without psychotic features (HCC) Active Problems:   Alcohol abuse   Suicidal behavior   Past Psychiatric History: History of alcohol abuse.  Past Medical History:  Past Medical History:  Diagnosis Date  . ETOH abuse   . GERD (gastroesophageal reflux disease)   . Hypercholesteremia   . Hypertension   . Sleep walking disorder     Past Surgical History:  Procedure Laterality Date  . APPENDECTOMY    . CORONARY STENT INTERVENTION N/A 12/30/2016   Procedure: Coronary Stent Intervention;  Surgeon: Iran OuchArida, Muhammad A, MD;  Location: [facility] INVASIVE CV LAB;  Service: Cardiovascular;  Laterality: N/A;  . KNEE SURGERY    . LEFT HEART CATH AND CORONARY ANGIOGRAPHY N/A 12/30/2016   Procedure: Left Heart Cath and Coronary Angiography;  Surgeon: Iran OuchArida, Muhammad A, MD;  Location: [facility] INVASIVE CV LAB;  Service: Cardiovascular;  Laterality: N/A;   Family History:  Family History  Problem Relation Age of Onset  . Hypertension Mother   . Hyperlipidemia Father    Family Psychiatric  History: See previous.  Positive for alcohol abuse Social History:  Social History   Substance and Sexual Activity  Alcohol Use Yes  . Alcohol/week: 8.0 standard drinks  . Types: 8 Cans of beer per week     Social History   Substance and Sexual Activity  Drug Use No    Social History   Socioeconomic History  . Marital  status: Married    Spouse name: Not on file  . Number of children: Not on file  . Years of education: Not on file  . Highest education level: Not on file  Occupational History    Employer: [employer]  Social Needs  . Financial resource strain: Not on file  . Food insecurity:    Worry: Not on file    Inability: Not on file  . Transportation needs:    Medical: Not on file    Non-medical: Not on file  Tobacco Use  . Smoking status: Current Every Day Smoker    Packs/day: 0.25    Years: 30.00    Pack years: 7.50    Types: Cigarettes  . Smokeless tobacco: Never Used  Substance and Sexual Activity  . Alcohol use: Yes    Alcohol/week: 8.0 standard drinks    Types: 8 Cans of beer per week  . Drug use: No  . Sexual activity: Not on file  Lifestyle  . Physical activity:    Days per week: Not on file    Minutes per session: Not on file  . Stress: Not on file  Relationships  . Social connections:    Talks on phone: Not on file    Gets together: Not on file    Attends religious service: Not on file    Active member of club or organization: Not on file    Attends meetings of clubs or organizations: Not on file    Relationship status: Not  on file  Other Topics Concern  . Not on file  Social History Narrative  . Not on file    Hospital Course: Patient showed no signs of delirium or seizures.  No aggression in the hospital no violence.  Patient claimed repeatedly that his text messages were a "joke".  Denied any thoughts of self-harm outside the hospital.  He was able to articulate an appropriate plan for self-care after discharge.  Was offered the opportunity of applying for inpatient rehabilitation for substance abuse but declined.  He will be referred to outpatient treatment.  He was started on low-dose citalopram for depression and educated about side effects and use of medicine.  Physical Findings: AIMS: Facial and Oral Movements Muscles of Facial Expression: None,  normal Lips and Perioral Area: None, normal Jaw: None, normal Tongue: None, normal,Extremity Movements Upper (arms, wrists, hands, fingers): Mild Lower (legs, knees, ankles, toes): None, normal, Trunk Movements Neck, shoulders, hips: None, normal, Overall Severity Severity of abnormal movements (highest score from questions above): None, normal Incapacitation due to abnormal movements: None, normal Patient's awareness of abnormal movements (rate only patient's report): No Awareness, Dental Status Current problems with teeth and/or dentures?: No Does patient usually wear dentures?: No  CIWA:  CIWA-Ar Total: 0 COWS:  COWS Total Score: 13  Musculoskeletal: Strength & Muscle Tone: within normal limits Gait & Station: normal Patient leans: N/A  Psychiatric Specialty Exam: Physical Exam  Nursing note and vitals reviewed. Constitutional: He appears well-developed and well-nourished.  HENT:  Head: Normocephalic and atraumatic.  Eyes: Pupils are equal, round, and reactive to light. Conjunctivae are normal.  Neck: Normal range of motion.  Cardiovascular: Regular rhythm and normal heart sounds.  Respiratory: Effort normal. No respiratory distress.  GI: Soft.  Musculoskeletal: Normal range of motion.  Neurological: He is alert.  Skin: Skin is warm and dry.  Psychiatric: He has a normal mood and affect. His behavior is normal. Judgment and thought content normal.    Review of Systems  Constitutional: Negative.   HENT: Negative.   Eyes: Negative.   Respiratory: Negative.   Cardiovascular: Negative.   Gastrointestinal: Negative.   Musculoskeletal: Negative.   Skin: Negative.   Neurological: Negative.   Psychiatric/Behavioral: Negative.     Blood pressure (!) 167/96, pulse 79, temperature 98.1 F (36.7 C), temperature source Oral, resp. rate 18, height 5\' 5"  (1.651 m), weight 89.8 kg, SpO2 99 %.Body mass index is 32.95 kg/m.  General Appearance: Casual  Eye Contact:  Good   Speech:  Clear and Coherent  Volume:  Normal  Mood:  Euthymic  Affect:  Congruent  Thought Process:  Goal Directed  Orientation:  Full (Time, Place, and Person)  Thought Content:  Logical  Suicidal Thoughts:  No  Homicidal Thoughts:  No  Memory:  Immediate;   Fair Recent;   Fair Remote;   Fair  Judgement:  Fair  Insight:  Fair  Psychomotor Activity:  Normal  Concentration:  Concentration: Fair  Recall:  [employer]  Fund of Knowledge:  Fair  Language:  Fair  Akathisia:  No  Handed:  Right  AIMS (if indicated):     Assets:  Desire for Improvement  ADL's:  Intact  Cognition:  WNL  Sleep:  Number of Hours: 7.75     Have you used any form of tobacco in the last 30 days? (Cigarettes, Smokeless Tobacco, Cigars, and/or Pipes): Yes  Has this patient used any form of tobacco in the last 30 days? (Cigarettes, Smokeless Tobacco, Cigars, and/or Pipes)  Yes, Yes, A prescription for an FDA-approved tobacco cessation medication was offered at discharge and the patient refused  Blood Alcohol level:  Lab Results  Component Value Date   ETH 262 (H) 12/13/2018   ETH 326 (HH) 86/76/1950    Metabolic Disorder Labs:  No results found for: HGBA1C, MPG No results found for: PROLACTIN Lab Results  Component Value Date   CHOL 202 (H) 12/30/2016   TRIG 127 12/30/2016   HDL 49 12/30/2016   CHOLHDL 4.1 12/30/2016   VLDL 25 12/30/2016   LDLCALC 128 (H) 12/30/2016    See Psychiatric Specialty Exam and Suicide Risk Assessment completed by Attending Physician prior to discharge.  Discharge destination:  Home  Is patient on multiple antipsychotic therapies at discharge:  No   Has Patient had three or more failed trials of antipsychotic monotherapy by history:  No  Recommended Plan for Multiple Antipsychotic Therapies: NA  Discharge Instructions    Diet - low sodium heart healthy   Complete by:  As directed    Increase activity slowly   Complete by:  As directed      Allergies as of  12/16/2018      Reactions   Compazine [prochlorperazine Edisylate] Other (See Comments)   seizure      Medication List    STOP taking these medications   gabapentin 300 MG capsule Commonly known as:  NEURONTIN   omeprazole 20 MG capsule Commonly known as:  PRILOSEC Replaced by:  pantoprazole 40 MG tablet     TAKE these medications     Indication  aspirin EC 81 MG tablet Take 1 tablet (81 mg total) by mouth daily.  Indication:  Acute Heart Attack   citalopram 20 MG tablet Commonly known as:  CELEXA Take 1 tablet (20 mg total) by mouth daily. Start taking on:  December 17, 2018  Indication:  Depression   lisinopril 40 MG tablet Commonly known as:  ZESTRIL Take 1 tablet (40 mg total) by mouth daily.  Indication:  Heart Failure   metoprolol tartrate 50 MG tablet Commonly known as:  LOPRESSOR Take 1 tablet (50 mg total) by mouth 2 (two) times daily.  Indication:  High Blood Pressure Disorder   pantoprazole 40 MG tablet Commonly known as:  PROTONIX Take 1 tablet (40 mg total) by mouth daily. Start taking on:  December 17, 2018 Replaces:  omeprazole 20 MG capsule  Indication:  Gastroesophageal Reflux Disease   rosuvastatin 40 MG tablet Commonly known as:  CRESTOR Take 1 tablet (40 mg total) by mouth daily.  Indication:  High Amount of Fats in the Blood   ticagrelor 60 MG Tabs tablet Commonly known as:  BRILINTA Take 1 tablet (60 mg total) by mouth 2 (two) times daily.  Indication:  Acute Coronary Syndrome      Follow-up Information    [facility] Follow up.   Why:  Please contact the office to make a hospital discharge appointment and set up services. Please take a photo id, insurance information, hospital discharge paperwork and any medication list with you to your first appointment. Thank You! Contact information: [place] [address] [phone]           Follow-up recommendations:  Activity:  Activity as tolerated Diet:   Heart healthy diet Other:  Continue on medicine and also continue with referral to outpatient substance abuse treatment strongly encouraged he get involved in substance abuse treatment to stop abusing alcohol which is clearly making his mood  worse.  Patient agrees to this and states he intends to stop drinking.  Comments: No longer meets commitment criteria at the time of discharge and declines referral to inpatient substance abuse treatment  Signed: Mordecai Rasmussen, MD 12/16/2018, 5:36 PM

## 2018-12-16 NOTE — [facility] Suicide Risk Assessment (Signed)
[facility] Discharge Suicide Risk Assessment   Principal Problem: Severe recurrent major depression without psychotic features [facility]) Discharge Diagnoses: Principal Problem:   Severe recurrent major depression without psychotic features ([place]) Active Problems:   Alcohol abuse   Suicidal behavior   Total Time spent with patient: 45 minutes  Musculoskeletal: Strength & Muscle Tone: within normal limits Gait & Station: normal Patient leans: N/A  Psychiatric Specialty Exam: Review of Systems  Constitutional: Negative.   HENT: Negative.   Eyes: Negative.   Respiratory: Negative.   Cardiovascular: Negative.   Gastrointestinal: Negative.   Musculoskeletal: Negative.   Skin: Negative.   Neurological: Negative.   Psychiatric/Behavioral: Positive for substance abuse. Negative for depression, hallucinations, memory loss and suicidal ideas. The patient is not nervous/anxious and does not have insomnia.     Blood pressure (!) 167/96, pulse 79, temperature 98.1 F (36.7 C), temperature source Oral, resp. rate 18, height 5\' 5"  (1.651 m), weight 89.8 kg, SpO2 99 %.Body mass index is 32.95 kg/m.  General Appearance: Casual  Eye Contact::  Fair  Speech:  Clear and [id number]  Volume:  Normal  Mood:  Euthymic  Affect:  Constricted  Thought Process:  Coherent  Orientation:  Full (Time, Place, and Person)  Thought Content:  Logical  Suicidal Thoughts:  No  Homicidal Thoughts:  No  Memory:  Immediate;   Fair Recent;   Fair Remote;   Fair  Judgement:  Fair  Insight:  Fair  Psychomotor Activity:  Normal  Concentration:  Fair  Recall:  [employer] of Knowledge:Fair  Language: Fair  Akathisia:  No  Handed:  Right  AIMS (if indicated):     Assets:  Desire for Improvement Housing Resilience Social Support  Sleep:  Number of Hours: 7.75  Cognition: WNL  ADL's:  Intact   Mental Status Per Nursing Assessment::   On Admission:  NA  Demographic Factors:  Male, Divorced or widowed,  Caucasian and Living alone  Loss Factors: Loss of significant relationship  Historical Factors: Impulsivity  Risk Reduction Factors:   Sense of responsibility to family, Employed, Positive social support and Positive therapeutic relationship  Continued Clinical Symptoms:  Depression:   Comorbid alcohol abuse/dependence Alcohol/Substance Abuse/Dependencies  Cognitive Features That Contribute To Risk:  Loss of executive function    Suicide Risk:  Minimal: No identifiable suicidal ideation.  Patients presenting with no risk factors but with morbid ruminations; may be classified as minimal risk based on the severity of the depressive symptoms  Follow-up Information    [facility] Follow up.   Why:  Please contact the office to make a hospital discharge appointment and set up services. Please take a photo id, insurance information, hospital discharge paperwork and any medication list with you to your first appointment. Thank You! Contact information: [place] [address] [phone]           Plan Of Care/Follow-up recommendations:  Activity:  Activity as tolerated Diet:  Regular diet Other:  Patient has been educated about the dangers of ongoing alcohol abuse and failure to treat his depression.  He will be referred to local resources for therapy and substance abuse treatment.  Patient is agreeable to the plan and currently denies any suicidal thoughts or intent.  Alethia Berthold, MD 12/16/2018, 8:52 AM

## 2018-12-16 NOTE — Progress Notes (Signed)
Patient alert and oriented x 4, affect is bright, hie is blunted, verbalized anxiety about his medication regimen being different from when is at home. Patient stated when at home he takes a different heart medication and he is worried it might affect him, patient appeared anxious and restless, his vital signs was checked and it was WNL, this reassured patient he became less anxious.patient currently denies SI/HI/AVH no distres noted will continue to monitor.

## 2018-12-16 NOTE — Plan of Care (Signed)
  Problem: Coping: Goal: Ability to verbalize frustrations and anger appropriately will improve Outcome: Progressing  Patient verbalized concerns about his medication regimen he is worried about his heart condition and feels he is not been taking the right amount of medicine .

## 2018-12-16 NOTE — Progress Notes (Signed)
D: Patient is aware of  Discharge this shift .Patient denies suicidal /homicidal ideations. Patient received all belongings brought in  A: No Storage medications. Writer reviewed Discharge Summary, Suicide Risk Assessment, and Transitional Record. Patient also received Prescriptions   from  MD. . Aware  Of follow up appointment . R: Patient left unit with no questions  Or concerns by taxi

## 2018-12-16 NOTE — Plan of Care (Signed)
  Problem: Coping: Goal: Ability to verbalize frustrations and anger appropriately will improve Outcome: Progressing  Patient verbalized frustrations to writer about his medication regimen.

## 2018-12-16 NOTE — Progress Notes (Signed)
  [facility] Adult Case Management Discharge Plan :  Will you be returning to the same living situation after discharge:  Yes,  home At discharge, do you have transportation home?: Yes,  taxi  Do you have the ability to pay for your medications: Yes,  BCBS insurance  Release of information consent forms completed and in the chart;  Patient to Follow up at: Follow-up Information    [facility] Follow up.   Why:  Please contact the office to make a hospital discharge appointment and set up services. Please take a photo id, insurance information, hospital discharge paperwork and any medication list with you to your first appointment. Thank You! Contact information: [place] [address] [phone]           Next level of care provider has access to [place] and Suicide Prevention discussed: Yes,  SPE completed with pt  Have you used any form of tobacco in the last 30 days? (Cigarettes, Smokeless Tobacco, Cigars, and/or Pipes): Yes  Has patient been referred to the Quitline?: Patient refused referral  Patient has been referred for addiction treatment: [place], LCSW 12/16/2018, 8:51 AM

## 2018-12-17 ENCOUNTER — Emergency Department: Payer: BC Managed Care – PPO

## 2018-12-17 ENCOUNTER — Emergency Department
Admission: EM | Admit: 2018-12-17 | Discharge: 2018-12-18 | Disposition: A | Discharge status: Home / Self Care | Payer: BC Managed Care – PPO | Attending: Emergency Medicine | Admitting: Emergency Medicine

## 2018-12-17 ENCOUNTER — Other Ambulatory Visit: Payer: Self-pay

## 2018-12-17 DIAGNOSIS — S0101XA Laceration without foreign body of scalp, initial encounter: Secondary | ICD-10-CM | POA: Diagnosis not present

## 2018-12-17 DIAGNOSIS — F10929 Alcohol use, unspecified with intoxication, unspecified: Secondary | ICD-10-CM

## 2018-12-17 DIAGNOSIS — F17210 Nicotine dependence, cigarettes, uncomplicated: Secondary | ICD-10-CM | POA: Insufficient documentation

## 2018-12-17 DIAGNOSIS — Y9389 Activity, other specified: Secondary | ICD-10-CM | POA: Insufficient documentation

## 2018-12-17 DIAGNOSIS — F10120 Alcohol abuse with intoxication, uncomplicated: Secondary | ICD-10-CM | POA: Insufficient documentation

## 2018-12-17 DIAGNOSIS — W010XXA Fall on same level from slipping, tripping and stumbling without subsequent striking against object, initial encounter: Secondary | ICD-10-CM | POA: Insufficient documentation

## 2018-12-17 DIAGNOSIS — I252 Old myocardial infarction: Secondary | ICD-10-CM | POA: Diagnosis not present

## 2018-12-17 DIAGNOSIS — Y998 Other external cause status: Secondary | ICD-10-CM | POA: Diagnosis not present

## 2018-12-17 DIAGNOSIS — Y929 Unspecified place or not applicable: Secondary | ICD-10-CM | POA: Diagnosis not present

## 2018-12-17 DIAGNOSIS — I10 Essential (primary) hypertension: Secondary | ICD-10-CM | POA: Diagnosis not present

## 2018-12-17 DIAGNOSIS — S0990XA Unspecified injury of head, initial encounter: Secondary | ICD-10-CM | POA: Diagnosis present

## 2018-12-17 LAB — COMPREHENSIVE METABOLIC PANEL
ALT: 40 U/L (ref 0–44)
AST: 45 U/L — ABNORMAL HIGH (ref 15–41)
Albumin: 3.9 g/dL (ref 3.5–5.0)
Alkaline Phosphatase: 60 U/L (ref 38–126)
Anion gap: 7 (ref 5–15)
BUN: 15 mg/dL (ref 6–20)
CO2: 20 mmol/L — ABNORMAL LOW (ref 22–32)
Calcium: 8.4 mg/dL — ABNORMAL LOW (ref 8.9–10.3)
Chloride: 104 mmol/L (ref 98–111)
Creatinine, Ser: 0.91 mg/dL (ref 0.61–1.24)
GFR calc Af Amer: >60 mL/min (ref >60)
GFR calc non Af Amer: >60 mL/min (ref >60)
Glucose, Bld: 83 mg/dL (ref 70–99)
Potassium: 3.5 mmol/L (ref 3.5–5.1)
Sodium: 131 mmol/L — ABNORMAL LOW (ref 135–145)
Total Bilirubin: 0.6 mg/dL (ref 0.3–1.2)
Total Protein: 7.1 g/dL (ref 6.5–8.1)

## 2018-12-17 LAB — CBC WITH DIFFERENTIAL/PLATELET
Abs Immature Granulocytes: 0.17 10*3/uL — ABNORMAL HIGH (ref 0.00–0.07)
Basophils Absolute: 0.1 10*3/uL (ref 0.0–0.1)
Basophils Relative: 1 %
Eosinophils Absolute: 0.4 10*3/uL (ref 0.0–0.5)
Eosinophils Relative: 3 %
HCT: 38 % — ABNORMAL LOW (ref 39.0–52.0)
Hemoglobin: 12.4 g/dL — ABNORMAL LOW (ref 13.0–17.0)
Immature Granulocytes: 1 %
Lymphocytes Relative: 19 %
Lymphs Abs: 2.4 10*3/uL (ref 0.7–4.0)
MCH: 32.7 pg (ref 26.0–34.0)
MCHC: 32.6 g/dL (ref 30.0–36.0)
MCV: 100.3 fL — ABNORMAL HIGH (ref 80.0–100.0)
Monocytes Absolute: 0.9 10*3/uL (ref 0.1–1.0)
Monocytes Relative: 7 %
Neutro Abs: 9 10*3/uL — ABNORMAL HIGH (ref 1.7–7.7)
Neutrophils Relative %: 69 %
Platelets: 270 10*3/uL (ref 150–400)
RBC: 3.79 MIL/uL — ABNORMAL LOW (ref 4.22–5.81)
RDW: 14.3 % (ref 11.5–15.5)
WBC: 13 10*3/uL — ABNORMAL HIGH (ref 4.0–10.5)
nRBC: 0 % (ref 0.0–0.2)

## 2018-12-17 LAB — TROPONIN I: Troponin I: <0.03 ng/mL (ref <0.03)

## 2018-12-17 LAB — ETHANOL: Alcohol, Ethyl (B): 290 mg/dL — ABNORMAL HIGH (ref <10)

## 2018-12-17 NOTE — ED Provider Notes (Signed)
[facility] Emergency Department Provider Note  ____________________________________________   I have reviewed the triage vital signs and the nursing notes. Where available I have reviewed prior notes and, if possible and indicated, outside hospital notes.    HISTORY  Chief Complaint Fall and Alcohol Intoxication    HPI Dustin Velazquez is a 58 y.o. male  presents today after a fall.  He states he thinks he tripped but is not sure.  Patient states he has been drinking today.  He has been in rehab twice in the last 2 weeks.  He does not have any SI or HI    Past Medical History:  Diagnosis Date  . ETOH abuse   . GERD (gastroesophageal reflux disease)   . Hypercholesteremia   . Hypertension   . Sleep walking disorder     Patient Active Problem List   Diagnosis Date Noted  . Severe recurrent major depression without psychotic features (HCC) 12/15/2018  . Suicidal behavior 12/14/2018  . Alcohol abuse 12/12/2018  . Non-STEMI (non-ST elevated myocardial infarction) (HCC) 12/29/2016    Past Surgical History:  Procedure Laterality Date  . APPENDECTOMY    . CORONARY STENT INTERVENTION N/A 12/30/2016   Procedure: Coronary Stent Intervention;  Surgeon: Iran OuchArida, Muhammad A, MD;  Location: [facility] INVASIVE CV LAB;  Service: Cardiovascular;  Laterality: N/A;  . KNEE SURGERY    . LEFT HEART CATH AND CORONARY ANGIOGRAPHY N/A 12/30/2016   Procedure: Left Heart Cath and Coronary Angiography;  Surgeon: Iran OuchArida, Muhammad A, MD;  Location: [facility] INVASIVE CV LAB;  Service: Cardiovascular;  Laterality: N/A;    Prior to Admission medications   Medication Sig Start Date End Date Taking? Authorizing Provider  aspirin EC 81 MG tablet Take 1 tablet (81 mg total) by mouth daily. 12/16/18   Clapacs, Jackquline DenmarkJohn T, MD  citalopram (CELEXA) 20 MG tablet Take 1 tablet (20 mg total) by mouth daily. 12/17/18   Clapacs, Jackquline DenmarkJohn T, MD  lisinopril (ZESTRIL) 40 MG tablet Take 1 tablet (40 mg total) by  mouth daily. 12/16/18   Clapacs, Jackquline DenmarkJohn T, MD  metoprolol tartrate (LOPRESSOR) 50 MG tablet Take 1 tablet (50 mg total) by mouth 2 (two) times daily. 12/16/18   Clapacs, Jackquline DenmarkJohn T, MD  pantoprazole (PROTONIX) 40 MG tablet Take 1 tablet (40 mg total) by mouth daily. 12/17/18   Clapacs, Jackquline DenmarkJohn T, MD  rosuvastatin (CRESTOR) 40 MG tablet Take 1 tablet (40 mg total) by mouth daily. 12/16/18 03/16/19  Clapacs, Jackquline DenmarkJohn T, MD  ticagrelor (BRILINTA) 60 MG TABS tablet Take 1 tablet (60 mg total) by mouth 2 (two) times daily. 12/16/18   Clapacs, Jackquline DenmarkJohn T, MD    Allergies Compazine [prochlorperazine edisylate]  Family History  Problem Relation Age of Onset  . Hypertension Mother   . Hyperlipidemia Father     Social History Social History   Tobacco Use  . Smoking status: Current Every Day Smoker    Packs/day: 0.25    Years: 30.00    Pack years: 7.50    Types: Cigarettes  . Smokeless tobacco: Never Used  Substance Use Topics  . Alcohol use: Yes    Alcohol/week: 8.0 standard drinks    Types: 8 Cans of beer per week  . Drug use: No    Review of Systems Constitutional: No fever/chills Eyes: No visual changes. ENT: No sore throat. No stiff neck no neck pain Cardiovascular: Denies chest pain. Respiratory: Denies shortness of breath. Gastrointestinal:   no vomiting.  No diarrhea.  No constipation.  Genitourinary: Negative for dysuria. Musculoskeletal: Negative lower extremity swelling Skin: Negative for rash. Neurological: Negative for severe headaches, focal weakness or numbness.   ____________________________________________   PHYSICAL EXAM:  VITAL SIGNS: ED Triage Vitals  Enc Vitals Group     BP 12/17/18 2042 93/65     Pulse Rate 12/17/18 2042 72     Resp 12/17/18 2042 (!) 22     Temp 12/17/18 2050 (!) 97.4 F (36.3 C)     Temp Source 12/17/18 2050 Oral     SpO2 12/17/18 2039 98 %     Weight --      Height --      Head Circumference --      Peak Flow --      Pain Score 12/17/18 2042 0      Pain Loc --      Pain Edu? --      Excl. in GC? --     Constitutional: Alert and  in no acute distress. Smells of etoh Eyes: Conjunctivae are normal Head: small lac to occiput in nad HEENT: No congestion/rhinnorhea. Mucous membranes are moist.  Oropharynx non-erythematous Neck:   Nontender with no meningismus, no masses, no stridor Cardiovascular: Normal rate, regular rhythm. Grossly normal heart sounds.  Good peripheral circulation. Respiratory: Normal respiratory effort.  No retractions. Lungs CTAB. Abdominal: Soft and nontender. No distention. No guarding no rebound Back:  There is no focal tenderness or step off.  there is no midline tenderness there are no lesions noted. there is no CVA tenderness Musculoskeletal: No lower extremity tenderness, no upper extremity tenderness. No joint effusions, no DVT signs strong distal pulses no edema Neurologic:  Normal speech and language. No gross focal neurologic deficits are appreciated.  Skin:  Skin is warm, dry and intact. No rash noted. Psychiatric: Mood and affect are normal. Speech and behavior are normal.  ____________________________________________   LABS (all labs ordered are listed, but only abnormal results are displayed)  Labs Reviewed  CBC WITH DIFFERENTIAL/PLATELET - Abnormal; Notable for the following components:      Result Value   WBC 13.0 (*)    RBC 3.79 (*)    Hemoglobin 12.4 (*)    HCT 38.0 (*)    MCV 100.3 (*)    Neutro Abs 9.0 (*)    Abs Immature Granulocytes 0.17 (*)    All other components within normal limits  COMPREHENSIVE METABOLIC PANEL - Abnormal; Notable for the following components:   Sodium 131 (*)    CO2 20 (*)    Calcium 8.4 (*)    AST 45 (*)    All other components within normal limits  ETHANOL - Abnormal; Notable for the following components:   Alcohol, Ethyl (B) 290 (*)    All other components within normal limits  TROPONIN I    Pertinent labs  results that were available during my care  of the patient were reviewed by me and considered in my medical decision making (see chart for details). ____________________________________________  EKG  I personally interpreted any EKGs ordered by me or triage  ____________________________________________  RADIOLOGY  Pertinent labs & imaging results that were available during my care of the patient were reviewed by me and considered in my medical decision making (see chart for details). If possible, patient and/or family made aware of any abnormal findings.  Ct Head Wo Contrast  Result Date: 12/17/2018 CLINICAL DATA:  Pain status post fall with a laceration to posterior head EXAM: CT HEAD WITHOUT CONTRAST CT  CERVICAL SPINE WITHOUT CONTRAST TECHNIQUE: Multidetector CT imaging of the head and cervical spine was performed following the standard protocol without intravenous contrast. Multiplanar CT image reconstructions of the cervical spine were also generated. COMPARISON:  None. 11/20/2016 FINDINGS: CT HEAD FINDINGS Brain: No evidence of acute infarction, hemorrhage, hydrocephalus, extra-axial collection or mass lesion/mass effect. Vascular: No hyperdense vessel or unexpected calcification. Skull: Normal. Negative for fracture or focal lesion. There is left posterior scalp swelling. Sinuses/Orbits: There is opacification of the left mastoid air cells which has progressed from the prior study. There is mild opacification of the right mastoid air cells. There is a small amount of fluid in the left middle ear. Other: None. CT CERVICAL SPINE FINDINGS Alignment: There is straightening of the normal cervical lordotic curvature which is nonspecific and may be secondary to patient positioning versus muscle spasm. Skull base and vertebrae: No acute fracture. No primary bone lesion or focal pathologic process. Soft tissues and spinal canal: No prevertebral fluid or swelling. No visible canal hematoma. Disc levels: There is mild multilevel disc height loss  greatest at the C5-C6 and C6-C7 levels. Upper chest: Negative. Other: None IMPRESSION: 1. No acute intracranial abnormality. 2. No acute cervical spine fracture. 3. Left posterior scalp swelling without evidence of an underlying calvarial defect. Pain status post fall pain status post fall Electronically Signed   By: Constance Holster M.D.   On: 12/17/2018 21:53   Ct Cervical Spine Wo Contrast  Result Date: 12/17/2018 CLINICAL DATA:  Pain status post fall with a laceration to posterior head EXAM: CT HEAD WITHOUT CONTRAST CT CERVICAL SPINE WITHOUT CONTRAST TECHNIQUE: Multidetector CT imaging of the head and cervical spine was performed following the standard protocol without intravenous contrast. Multiplanar CT image reconstructions of the cervical spine were also generated. COMPARISON:  None. 11/20/2016 FINDINGS: CT HEAD FINDINGS Brain: No evidence of acute infarction, hemorrhage, hydrocephalus, extra-axial collection or mass lesion/mass effect. Vascular: No hyperdense vessel or unexpected calcification. Skull: Normal. Negative for fracture or focal lesion. There is left posterior scalp swelling. Sinuses/Orbits: There is opacification of the left mastoid air cells which has progressed from the prior study. There is mild opacification of the right mastoid air cells. There is a small amount of fluid in the left middle ear. Other: None. CT CERVICAL SPINE FINDINGS Alignment: There is straightening of the normal cervical lordotic curvature which is nonspecific and may be secondary to patient positioning versus muscle spasm. Skull base and vertebrae: No acute fracture. No primary bone lesion or focal pathologic process. Soft tissues and spinal canal: No prevertebral fluid or swelling. No visible canal hematoma. Disc levels: There is mild multilevel disc height loss greatest at the C5-C6 and C6-C7 levels. Upper chest: Negative. Other: None IMPRESSION: 1. No acute intracranial abnormality. 2. No acute cervical spine  fracture. 3. Left posterior scalp swelling without evidence of an underlying calvarial defect. Pain status post fall pain status post fall Electronically Signed   By: Constance Holster M.D.   On: 12/17/2018 21:53   ____________________________________________    PROCEDURES  Procedure(s) performed: None  Procedures  Critical Care performed: None  ____________________________________________   INITIAL IMPRESSION / ASSESSMENT AND PLAN / ED COURSE  Pertinent labs & imaging results that were available during my care of the patient were reviewed by me and considered in my medical decision making (see chart for details).  Clinically intoxicated has high alcohol level CT is negative fortunately, we will decide about to Dr. Owens Shark pending sobriety and further plan for his  disposition after he is more awake    ____________________________________________   FINAL CLINICAL IMPRESSION(S) / ED DIAGNOSES  Final diagnoses:  None      This chart was dictated using voice recognition software.  Despite best efforts to proofread,  errors can occur which can change meaning.      Jeanmarie PlantMcShane, Meganne Rita A, MD 12/17/18 [phone]

## 2018-12-17 NOTE — ED Triage Notes (Signed)
PT to ED via EMS from home. PT fell and hit head, not sure of LOC. PT presents with lac to posterior head and skin tear to right arm. PT alert but forgetful. VSS

## 2018-12-22 ENCOUNTER — Emergency Department: Payer: BC Managed Care – PPO

## 2018-12-22 ENCOUNTER — Other Ambulatory Visit: Payer: Self-pay

## 2018-12-22 ENCOUNTER — Emergency Department
Admission: EM | Admit: 2018-12-22 | Discharge: 2018-12-23 | Disposition: A | Discharge status: Other Acute Inpatient Hospital | Payer: BC Managed Care – PPO | Attending: Student in an Organized Health Care Education/Training Program | Admitting: Student in an Organized Health Care Education/Training Program

## 2018-12-22 ENCOUNTER — Encounter: Payer: Self-pay | Admitting: Emergency Medicine

## 2018-12-22 DIAGNOSIS — W06XXXA Fall from bed, initial encounter: Secondary | ICD-10-CM | POA: Insufficient documentation

## 2018-12-22 DIAGNOSIS — Y9389 Activity, other specified: Secondary | ICD-10-CM | POA: Insufficient documentation

## 2018-12-22 DIAGNOSIS — Z955 Presence of coronary angioplasty implant and graft: Secondary | ICD-10-CM | POA: Diagnosis not present

## 2018-12-22 DIAGNOSIS — Y92538 Other ambulatory health services establishments as the place of occurrence of the external cause: Secondary | ICD-10-CM | POA: Insufficient documentation

## 2018-12-22 DIAGNOSIS — S0990XA Unspecified injury of head, initial encounter: Secondary | ICD-10-CM | POA: Diagnosis not present

## 2018-12-22 DIAGNOSIS — I10 Essential (primary) hypertension: Secondary | ICD-10-CM | POA: Diagnosis not present

## 2018-12-22 DIAGNOSIS — R4182 Altered mental status, unspecified: Secondary | ICD-10-CM | POA: Diagnosis present

## 2018-12-22 DIAGNOSIS — R45851 Suicidal ideations: Secondary | ICD-10-CM | POA: Insufficient documentation

## 2018-12-22 DIAGNOSIS — Y908 Blood alcohol level of 240 mg/100 ml or more: Secondary | ICD-10-CM | POA: Insufficient documentation

## 2018-12-22 DIAGNOSIS — Z046 Encounter for general psychiatric examination, requested by authority: Secondary | ICD-10-CM | POA: Insufficient documentation

## 2018-12-22 DIAGNOSIS — F329 Major depressive disorder, single episode, unspecified: Secondary | ICD-10-CM | POA: Diagnosis not present

## 2018-12-22 DIAGNOSIS — F1010 Alcohol abuse, uncomplicated: Secondary | ICD-10-CM | POA: Insufficient documentation

## 2018-12-22 DIAGNOSIS — F17210 Nicotine dependence, cigarettes, uncomplicated: Secondary | ICD-10-CM | POA: Insufficient documentation

## 2018-12-22 DIAGNOSIS — F419 Anxiety disorder, unspecified: Secondary | ICD-10-CM | POA: Diagnosis not present

## 2018-12-22 DIAGNOSIS — F332 Major depressive disorder, recurrent severe without psychotic features: Secondary | ICD-10-CM | POA: Diagnosis not present

## 2018-12-22 DIAGNOSIS — S199XXA Unspecified injury of neck, initial encounter: Secondary | ICD-10-CM | POA: Diagnosis not present

## 2018-12-22 DIAGNOSIS — Z03818 Encounter for observation for suspected exposure to other biological agents ruled out: Secondary | ICD-10-CM | POA: Insufficient documentation

## 2018-12-22 DIAGNOSIS — F1020 Alcohol dependence, uncomplicated: Secondary | ICD-10-CM | POA: Diagnosis not present

## 2018-12-22 LAB — COMPREHENSIVE METABOLIC PANEL
ALT: 111 U/L — ABNORMAL HIGH (ref 0–44)
AST: 141 U/L — ABNORMAL HIGH (ref 15–41)
Albumin: 4.6 g/dL (ref 3.5–5.0)
Alkaline Phosphatase: 67 U/L (ref 38–126)
Anion gap: 13 (ref 5–15)
BUN: 7 mg/dL (ref 6–20)
CO2: 28 mmol/L (ref 22–32)
Calcium: 9.7 mg/dL (ref 8.9–10.3)
Chloride: 102 mmol/L (ref 98–111)
Creatinine, Ser: 0.79 mg/dL (ref 0.61–1.24)
GFR calc Af Amer: >60 mL/min (ref >60)
GFR calc non Af Amer: >60 mL/min (ref >60)
Glucose, Bld: 107 mg/dL — ABNORMAL HIGH (ref 70–99)
Potassium: 4.7 mmol/L (ref 3.5–5.1)
Sodium: 143 mmol/L (ref 135–145)
Total Bilirubin: 0.5 mg/dL (ref 0.3–1.2)
Total Protein: 8 g/dL (ref 6.5–8.1)

## 2018-12-22 LAB — CBC
HCT: 42.5 % (ref 39.0–52.0)
Hemoglobin: 14.3 g/dL (ref 13.0–17.0)
MCH: 32.8 pg (ref 26.0–34.0)
MCHC: 33.6 g/dL (ref 30.0–36.0)
MCV: 97.5 fL (ref 80.0–100.0)
Platelets: 252 10*3/uL (ref 150–400)
RBC: 4.36 MIL/uL (ref 4.22–5.81)
RDW: 14.4 % (ref 11.5–15.5)
WBC: 9.5 10*3/uL (ref 4.0–10.5)
nRBC: 0 % (ref 0.0–0.2)

## 2018-12-22 LAB — ETHANOL: Alcohol, Ethyl (B): 290 mg/dL — ABNORMAL HIGH (ref <10)

## 2018-12-22 LAB — URINE DRUG SCREEN, QUALITATIVE (ARMC ONLY)
Amphetamines, Ur Screen: NOT DETECTED
Barbiturates, Ur Screen: NOT DETECTED
Benzodiazepine, Ur Scrn: POSITIVE — AB
Cannabinoid 50 Ng, Ur ~~LOC~~: NOT DETECTED
Cocaine Metabolite,Ur ~~LOC~~: NOT DETECTED
MDMA (Ecstasy)Ur Screen: NOT DETECTED
Methadone Scn, Ur: NOT DETECTED
Opiate, Ur Screen: NOT DETECTED
Phencyclidine (PCP) Ur S: NOT DETECTED
Tricyclic, Ur Screen: NOT DETECTED

## 2018-12-22 LAB — SARS CORONAVIRUS 2 BY RT PCR (HOSPITAL ORDER, PERFORMED IN ~~LOC~~ HOSPITAL LAB): SARS Coronavirus 2: NEGATIVE

## 2018-12-22 MED ORDER — PANTOPRAZOLE SODIUM 40 MG PO TBEC
40.0000 mg | DELAYED_RELEASE_TABLET | Freq: Every day | ORAL | Status: DC
  Administered 2018-12-23: 40 mg via ORAL
  Filled 2018-12-22: qty 1

## 2018-12-22 MED ORDER — LORAZEPAM 2 MG PO TABS: 0.0000 mg | ORAL_TABLET | Freq: Two times a day (BID) | ORAL | Status: DC

## 2018-12-22 MED ORDER — METOPROLOL TARTRATE 50 MG PO TABS
50.0000 mg | ORAL_TABLET | Freq: Two times a day (BID) | ORAL | Status: DC
  Administered 2018-12-22 – 2018-12-23 (×2): 50 mg via ORAL
  Filled 2018-12-22 (×2): qty 1

## 2018-12-22 MED ORDER — LORAZEPAM 2 MG/ML IJ SOLN: 0.0000 mg | Freq: Four times a day (QID) | INTRAMUSCULAR | Status: DC

## 2018-12-22 MED ORDER — LISINOPRIL 10 MG PO TABS
40.0000 mg | ORAL_TABLET | Freq: Every day | ORAL | Status: DC
  Administered 2018-12-23: 40 mg via ORAL
  Filled 2018-12-22: qty 4

## 2018-12-22 MED ORDER — VITAMIN B-1 100 MG PO TABS
100.0000 mg | ORAL_TABLET | Freq: Every day | ORAL | Status: DC
  Administered 2018-12-22 – 2018-12-23 (×2): 100 mg via ORAL
  Filled 2018-12-22 (×2): qty 1

## 2018-12-22 MED ORDER — TICAGRELOR 60 MG PO TABS
60.0000 mg | ORAL_TABLET | Freq: Two times a day (BID) | ORAL | Status: DC
  Administered 2018-12-22 – 2018-12-23 (×2): 60 mg via ORAL
  Filled 2018-12-22 (×3): qty 1

## 2018-12-22 MED ORDER — THIAMINE HCL 100 MG/ML IJ SOLN: 100.0000 mg | Freq: Every day | INTRAMUSCULAR | Status: DC

## 2018-12-22 MED ORDER — ASPIRIN EC 81 MG PO TBEC
81.0000 mg | DELAYED_RELEASE_TABLET | Freq: Every day | ORAL | Status: DC
  Administered 2018-12-23: 81 mg via ORAL
  Filled 2018-12-22: qty 1

## 2018-12-22 MED ORDER — LORAZEPAM 2 MG/ML IJ SOLN: 0.0000 mg | Freq: Two times a day (BID) | INTRAMUSCULAR | Status: DC

## 2018-12-22 MED ORDER — LORAZEPAM 2 MG PO TABS
0.0000 mg | ORAL_TABLET | Freq: Four times a day (QID) | ORAL | Status: DC
  Administered 2018-12-22: 2 mg via ORAL
  Administered 2018-12-22: 1 mg via ORAL
  Administered 2018-12-23: 2 mg via ORAL
  Administered 2018-12-23: 1 mg via ORAL
  Filled 2018-12-22 (×4): qty 1

## 2018-12-22 NOTE — ED Notes (Signed)
Hourly rounding reveals patient in hall bed. No complaints, stable, in no acute distress. Q15 minute rounds and monitoring via Rover and Officer to continue.  

## 2018-12-22 NOTE — ED Provider Notes (Signed)
[facility] Emergency Department Provider Note    First MD Initiated Contact with Patient 12/22/18 1316     (approximate)  I have reviewed the triage vital signs and the nursing notes.   HISTORY  Chief Complaint Alcohol Problem    HPI Dustin Velazquez is a 58 y.o. male extensive history of EtOH use with recent admission the hospital for severe recurrent depression and suicidality presents the ER intoxicated.  Somewhat amnestic to the event of the previous night.  Patient is uncertain as to how he got to the ER.  Reports of the past few weeks he has been holding a gun to his head.  I called the patient's father with his permission to get additional history.  He denies any additional pain at this time.  Denies any other substance abuse.  My evaluation in the ER he denies any suicidality.  HI.     Past Medical History:  Diagnosis Date  . ETOH abuse   . GERD (gastroesophageal reflux disease)   . Hypercholesteremia   . Hypertension   . Sleep walking disorder    Family History  Problem Relation Age of Onset  . Hypertension Mother   . Hyperlipidemia Father    Past Surgical History:  Procedure Laterality Date  . APPENDECTOMY    . CORONARY STENT INTERVENTION N/A 12/30/2016   Procedure: Coronary Stent Intervention;  Surgeon: Iran Ouch, MD;  Location: [facility] INVASIVE CV LAB;  Service: Cardiovascular;  Laterality: N/A;  . KNEE SURGERY    . LEFT HEART CATH AND CORONARY ANGIOGRAPHY N/A 12/30/2016   Procedure: Left Heart Cath and Coronary Angiography;  Surgeon: Iran Ouch, MD;  Location: [facility] INVASIVE CV LAB;  Service: Cardiovascular;  Laterality: N/A;   Patient Active Problem List   Diagnosis Date Noted  . Severe recurrent major depression without psychotic features (HCC) 12/15/2018  . Suicidal behavior 12/14/2018  . Alcohol abuse 12/12/2018  . Non-STEMI (non-ST elevated myocardial infarction) (HCC) 12/29/2016      Prior to Admission  medications   Medication Sig Start Date End Date Taking? Authorizing Provider  aspirin EC 81 MG tablet Take 1 tablet (81 mg total) by mouth daily. 12/16/18   Clapacs, Jackquline Denmark, MD  citalopram (CELEXA) 20 MG tablet Take 1 tablet (20 mg total) by mouth daily. 12/17/18   Clapacs, Jackquline Denmark, MD  lisinopril (ZESTRIL) 40 MG tablet Take 1 tablet (40 mg total) by mouth daily. 12/16/18   Clapacs, Jackquline Denmark, MD  metoprolol tartrate (LOPRESSOR) 50 MG tablet Take 1 tablet (50 mg total) by mouth 2 (two) times daily. 12/16/18   Clapacs, Jackquline Denmark, MD  pantoprazole (PROTONIX) 40 MG tablet Take 1 tablet (40 mg total) by mouth daily. 12/17/18   Clapacs, Jackquline Denmark, MD  rosuvastatin (CRESTOR) 40 MG tablet Take 1 tablet (40 mg total) by mouth daily. 12/16/18 03/16/19  Clapacs, Jackquline Denmark, MD  ticagrelor (BRILINTA) 60 MG TABS tablet Take 1 tablet (60 mg total) by mouth 2 (two) times daily. 12/16/18   Clapacs, Jackquline Denmark, MD    Allergies Compazine [prochlorperazine edisylate]    Social History Social History   Tobacco Use  . Smoking status: Current Every Day Smoker    Packs/day: 0.25    Years: 30.00    Pack years: 7.50    Types: Cigarettes  . Smokeless tobacco: Never Used  Substance Use Topics  . Alcohol use: Yes    Alcohol/week: 8.0 standard drinks    Types: 8 Cans of beer  per week  . Drug use: No    Review of Systems Patient denies headaches, rhinorrhea, blurry vision, numbness, shortness of breath, chest pain, edema, cough, abdominal pain, nausea, vomiting, diarrhea, dysuria, fevers, rashes or hallucinations unless otherwise stated above in HPI. ____________________________________________   PHYSICAL EXAM:  VITAL SIGNS: Vitals:   12/22/18 1307 12/22/18 1405  BP: (!) 113/95 100/65  Pulse: 73 68  Resp: 20 18  Temp: 98.5 F (36.9 C) 98.3 F (36.8 C)  SpO2: 99% 99%    Constitutional: Alert but intoxicated appearing Eyes: Conjunctivae are normal.  Head: Atraumatic. Nose: No congestion/rhinnorhea. Mouth/Throat:  Mucous membranes are moist.   Neck: No stridor. Painless ROM.  Cardiovascular: Normal rate, regular rhythm. Grossly normal heart sounds.  Good peripheral circulation. Respiratory: Normal respiratory effort.  No retractions. Lungs CTAB. Gastrointestinal: Soft and nontender. No distention. No abdominal bruits. No CVA tenderness. Genitourinary:  Musculoskeletal: No lower extremity tenderness nor edema.  No joint effusions. Neurologic:  Normal speech and language. No gross focal neurologic deficits are appreciated. No facial droop Skin:  Skin is warm, dry and intact. No rash noted. Psychiatric: intoxicated, disheveled   ____________________________________________   LABS (all labs ordered are listed, but only abnormal results are displayed)  Results for orders placed or performed during the hospital encounter of 12/22/18 (from the past 24 hour(s))  Comprehensive metabolic panel     Status: Abnormal   Collection Time: 12/22/18  1:12 PM  Result Value Ref Range   Sodium 143 135 - 145 mmol/L   Potassium 4.7 3.5 - 5.1 mmol/L   Chloride 102 98 - 111 mmol/L   CO2 28 22 - 32 mmol/L   Glucose, Bld 107 (H) 70 - 99 mg/dL   BUN 7 6 - 20 mg/dL   Creatinine, Ser 0.79 0.61 - 1.24 mg/dL   Calcium 9.7 8.9 - 10.3 mg/dL   Total Protein 8.0 6.5 - 8.1 g/dL   Albumin 4.6 3.5 - 5.0 g/dL   AST 141 (H) 15 - 41 U/L   ALT 111 (H) 0 - 44 U/L   Alkaline Phosphatase 67 38 - 126 U/L   Total Bilirubin 0.5 0.3 - 1.2 mg/dL   GFR calc non Af Amer >60 >60 mL/min   GFR calc Af Amer >60 >60 mL/min   Anion gap 13 5 - 15  Ethanol     Status: Abnormal   Collection Time: 12/22/18  1:12 PM  Result Value Ref Range   Alcohol, Ethyl (B) 290 (H) <10 mg/dL  cbc     Status: None   Collection Time: 12/22/18  1:12 PM  Result Value Ref Range   WBC 9.5 4.0 - 10.5 K/uL   RBC 4.36 4.22 - 5.81 MIL/uL   Hemoglobin 14.3 13.0 - 17.0 g/dL   HCT 42.5 39.0 - 52.0 %   MCV 97.5 80.0 - 100.0 fL   MCH 32.8 26.0 - 34.0 pg   MCHC 33.6  30.0 - 36.0 g/dL   RDW 14.4 11.5 - 15.5 %   Platelets 252 150 - 400 K/uL   nRBC 0.0 0.0 - 0.2 %   ____________________________________________ ____________________________________________  RADIOLOGY  I personally reviewed all radiographic images ordered to evaluate for the above acute complaints and reviewed radiology reports and findings.  These findings were personally discussed with the patient.  Please see medical record for radiology report.  ____________________________________________   PROCEDURES  Procedure(s) performed:  Procedures    Critical Care performed: no ____________________________________________   INITIAL IMPRESSION / ASSESSMENT AND  PLAN / ED COURSE  Pertinent labs & imaging results that were available during my care of the patient were reviewed by me and considered in my medical decision making (see chart for details).   DDX: Psychosis, delirium, medication effect, noncompliance, polysubstance abuse, Si, Hi, depression   Raoul PitchMichael L Allmendinger is a 58 y.o. who presents to the ED with symptoms as described above.  Patient clearly intoxicated.  He is requesting help with detox.  Unable to provide much additional history but will be sent for the by differential.  Will speak with family members.  Clinical Course as of Dec 22 1503  Tue Dec 22, 2018  1349 Patient just tried to get out of bed and fell landing striking his head on the floor.  Neuro exam is reassuring.  Will order sitter.  Will order CT imaging as neuro exam somewhat limited due to his alcoholic encephalopathy.   [PR]  1455 Spoke with patient's father after being given his permission to speak with him.  The father states that the patient was testing in this morning stating that he was going to kill himself.  Patient no longer has access to a gun but clearly becomes suicidal when intoxicated and this is been a recurring theme.  Due to his intoxication will IVC for clearance and psychiatric evaluation.    [PR]    Clinical Course User Index [PR] Willy Eddyobinson, Dorthia Tout, MD    The patient was evaluated in Emergency Department today for the symptoms described in the history of present illness. He/she was evaluated in the context of the global COVID-19 pandemic, which necessitated consideration that the patient might be at risk for infection with the SARS-CoV-2 virus that causes COVID-19. Institutional protocols and algorithms that pertain to the evaluation of patients at risk for COVID-19 are in a state of rapid change based on information released by regulatory bodies including the CDC and federal and state organizations. These policies and algorithms were followed during the patient's care in the ED.  As part of my medical decision making, I reviewed the following data within the electronic MEDICAL RECORD NUMBER Nursing notes reviewed and incorporated, Labs reviewed, notes from prior ED visits and [place] Controlled Substance Database   ____________________________________________   FINAL CLINICAL IMPRESSION(S) / ED DIAGNOSES  Final diagnoses:  Alcohol abuse  Suicidal ideations      NEW MEDICATIONS STARTED DURING THIS VISIT:  New Prescriptions   No medications on file     Note:  This document was prepared using Dragon voice recognition software and may include unintentional dictation errors.    Willy Eddyobinson, Hollan Philipp, MD 12/22/18 1505

## 2018-12-22 NOTE — ED Notes (Signed)
Report to include Situation, Background, Assessment, and Recommendations received from Jadeka RN. Patient alert and oriented, warm and dry, in no acute distress. Patient denies SI, HI, AVH and pain. Patient made aware of Q15 minute rounds and Rover and Officer presence for their safety. Patient instructed to come to me with needs or concerns.  

## 2018-12-22 NOTE — ED Notes (Signed)
Hourly rounding reveals patient sleeping in hall bed. No complaints, stable, in no acute distress. Q15 minute rounds and monitoring via Rover and Officer to continue.  

## 2018-12-22 NOTE — ED Notes (Signed)
Pt asleep in bed at this time. NAD noted. This RN spoke with MD and Agricultural consultant, pt not dressed out, however will dress patient out when he awakens, this RN explained to charge RN when speaking with patient upon his arrival pt was unsure of how he got here and why he was here. When patient is awake pt requires multiple redirections and is a high fall risk. After discussing with MD and charge RN in best interest of patient to let him sleep at this time and address changing him out when he wakes up.

## 2018-12-22 NOTE — ED Notes (Signed)
Warm blanket given to patient at this time.

## 2018-12-22 NOTE — ED Notes (Signed)
Pt dress ed out by this RN and Journalist, newspaper, 1 yellow chain with 1 yellow ring, 1 yellow pendant with [address], 1 pair shorts, 1 pair underwear, 1 t shirt, 1 hat. Pt moved to [facility] after being dressed out currently eating dinner at this time.

## 2018-12-22 NOTE — BH Assessment (Signed)
Patient has been accepted to [facility], Pending COVID-19 results. Patient assigned to room 300-2 Accepting physician is Dr. Mallie Darting.  Call report to [phone].  Representative was [employer].   ER Staff is aware of it:  Vaughan Basta, ER Secretary  Jinny Blossom, Patient's Nurse  Address: [address] [place], [place]  Bed will be available any time after 8pm

## 2018-12-22 NOTE — ED Triage Notes (Signed)
Pt reports wants detox from ETOH. Pt sates was here last week for the same but left before he could get help.

## 2018-12-22 NOTE — ED Notes (Signed)
ODS returned due to patient now being IVC.

## 2018-12-22 NOTE — ED Notes (Signed)
Pt sitting on side of bed speaking with this RN, pt then slipped out of low bed while speaking to this and landed on floor. Pt fell from a sitting position. This RN immediately around desk to patient's side, pt immediately awakened with verbal stimuli, able to get back in bed without assistance. MD made aware, head CT ordered by MD. Karl Ito RN made aware. Yellow socks, yellow band applied, pt in low bed already, pt in hall bed with good visual access by staff.

## 2018-12-22 NOTE — ED Notes (Signed)
Snack and beverage given. 

## 2018-12-22 NOTE — BH Assessment (Addendum)
Assessment Note  Dustin Velazquez is an 58 y.o. male who presents to the ER via [employer], after his father contacted them. Per the report of the patient's father Dustin Velazquez [phone]), for the last four weeks the patient has declined. He and his wife recently separated and he has had a hard time dealing with it. His alcohol consumption has increased and his behaviors has drastically changed. He was recently inpatient with [facility], due texting a picture to his family with a gun to his head, stating he was having thoughts of killing his self. After patient was admitted to the Hospital, he stated, he was trying to get his wife's his attention so she could feel sorry for him. This morning (12/22/2018) the patient, sent his mother a text message stating he wanted to kill hisself because the current state of his life.  Per the patient, he does not remember sending his mother a text message. Since he's been in the ER, he's spoken with his parents and discussed the message. Patient states, they told him, the message was sent around 10am and he hadn't had time to start drinking by then. "I didn't go to the liquor store until 9:30(am) and that wouldn't give me enough time to be too drunk not to remember sending a message like that. Besides, I wouldn't send my mommy a message like that. Not to my mommy. That would hurt her. Now the last time I was here, I can say what I did was stupid. But now, I here and I know I need help but I can't lose my job..."  During the interview, patient was cooperative and pleasant. He was able to provide appropriate answers to the questions. When he was initially asked about SI, patient denied it and reported he didn't know why he was brought to the ER. Upon arrival to the ER, his BAC was 290. Patient denies current involvement with the legal system. He has no history violence or aggression.  Discussed patient with psychiatrist (Dr. Leverne Velazquez), and patient is recommended for  inpatient treatment.    Diagnosis: Alcohol Use Disorder & Major Depression  Past Medical History:  Past Medical History:  Diagnosis Date  . ETOH abuse   . GERD (gastroesophageal reflux disease)   . Hypercholesteremia   . Hypertension   . Sleep walking disorder     Past Surgical History:  Procedure Laterality Date  . APPENDECTOMY    . CORONARY STENT INTERVENTION N/A 12/30/2016   Procedure: Coronary Stent Intervention;  Surgeon: Dustin Hampshire, MD;  Location: [place] [facility];  Service: Cardiovascular;  Laterality: N/A;  . KNEE SURGERY    . LEFT HEART CATH AND CORONARY ANGIOGRAPHY N/A 12/30/2016   Procedure: Left Heart Cath and Coronary Angiography;  Surgeon: Dustin Hampshire, MD;  Location: [facility] [facility];  Service: Cardiovascular;  Laterality: N/A;    Family History:  Family History  Problem Relation Age of Onset  . Hypertension Mother   . Hyperlipidemia Father     Social History:  reports that he has been smoking cigarettes. He has a 7.50 pack-year smoking history. He has never used smokeless tobacco. He reports current alcohol use of about 8.0 standard drinks of alcohol per week. He reports that he does not use drugs.  Additional Social History:  Alcohol / Drug Use Pain Medications: See PTA Prescriptions: See PTA Over the Counter: See PTA History of alcohol / drug use?: Yes Longest period of sobriety (when/how long): Unable to quantify Negative  Consequences of Use: Personal relationships, Work / School Substance #1 Name of Substance 1: Alcohol 1 - Last Use / Amount: 12/22/2018  CIWA: CIWA-Ar BP: 100/65 Pulse Rate: 68 Nausea and Vomiting: no nausea and no vomiting Tactile Disturbances: none Tremor: severe, even with arms not extended Auditory Disturbances: very mild harshness or ability to frighten Paroxysmal Sweats: no sweat visible Visual Disturbances: not present Anxiety: no anxiety, at ease Headache, Fullness in Head: none present Agitation:  somewhat more than normal activity Orientation and Clouding of Sensorium: oriented and can do serial additions CIWA-Ar Total: 9 COWS:    Allergies:  Allergies  Allergen Reactions  . Compazine [Prochlorperazine Edisylate] Other (See Comments)    seizure    Home Medications: (Not in a hospital admission)   OB/GYN Status:  No LMP for male patient.  General Assessment Data [facility] Assessment: In system Is this a Tele or Face-to-Face Assessment?: Face-to-Face Is this an Initial Assessment or a Re-assessment for this encounter?: Initial Assessment Patient Accompanied by:: N/A Language Other than English: No Living Arrangements: Other (Comment)(Private Home) What gender do you identify as?: Male Marital status: Separated Pregnancy Status: No Living Arrangements: Alone Can pt return to current living arrangement?: Yes Admission Status: Involuntary Petitioner: ED Attending Is patient capable of signing voluntary admission?: No(Under IVC) Referral Source: Self/Family/Friend Insurance type: BCBS  Medical Screening Exam [facility] Walk-in ONLY) Medical Exam completed: Yes  Crisis Care Plan Living Arrangements: Alone Legal Guardian: Other:(Self) Name of Psychiatrist: Reports of none Name of Therapist: Reports of none  Education Status Is patient currently in school?: No Highest grade of school patient has completed: high school diploma Is the patient employed, unemployed or receiving disability?: Employed  Risk to self with the past 6 months Suicidal Ideation: No Has patient been a risk to self within the past 6 months prior to admission? : No Suicidal Intent: No Has patient had any suicidal intent within the past 6 months prior to admission? : No Is patient at risk for suicide?: No Suicidal Plan?: No Has patient had any suicidal plan within the past 6 months prior to admission? : No Access to Means: No Specify Access to Suicidal Means: Reports of  none What has been your use of drugs/alcohol within the last 12 months?: Alcohol Previous Attempts/Gestures: No How many times?: 0 Other Self Harm Risks: Reports of none Triggers for Past Attempts: None known Intentional Self Injurious Behavior: None Family Suicide History: No Recent stressful life event(s): Loss (Comment), Divorce(Recent breakup with his wife) Persecutory voices/beliefs?: No Depression: Yes Depression Symptoms: Tearfulness, Insomnia, Isolating, Fatigue, Guilt, Loss of interest in usual pleasures, Feeling worthless/self pity Substance abuse history and/or treatment for substance abuse?: Yes Suicide prevention information given to non-admitted patients: Not applicable  Risk to Others within the past 6 months Homicidal Ideation: No Does patient have any lifetime risk of violence toward others beyond the six months prior to admission? : No Thoughts of Harm to Others: No Current Homicidal Intent: No Current Homicidal Plan: No Access to Homicidal Means: No Identified Victim: Reports of none History of harm to others?: No Assessment of Violence: None Noted Violent Behavior Description: Reports of none Does patient have access to weapons?: No Criminal Charges Pending?: No Does patient have a court date: No Is patient on probation?: No  Psychosis Hallucinations: None noted Delusions: None noted  Mental Status Report Appearance/Hygiene: Unremarkable, In scrubs Eye Contact: Good Motor Activity: (Patient was laying the bed) Speech: Slurred Level of Consciousness: Alert  Mood: Sad, Pleasant Affect: Appropriate to circumstance, Sad Anxiety Level: Minimal Thought Processes: Relevant Judgement: Partial Orientation: Person, Place, Time, Situation, Appropriate for developmental age Obsessive Compulsive Thoughts/Behaviors: Minimal  Cognitive Functioning Concentration: Normal Memory: Recent Intact, Remote Intact Is patient IDD: No Insight: Fair Impulse Control:  Fair Appetite: Good Have you had any weight changes? : No Change Sleep: No Change Total Hours of Sleep: 8 Vegetative Symptoms: None  ADLScreening [facility] Assessment Services) Patient's cognitive ability adequate to safely complete daily activities?: Yes Patient able to express need for assistance with ADLs?: Yes Independently performs ADLs?: Yes (appropriate for developmental age)  Prior Inpatient Therapy Prior Inpatient Therapy: Yes Prior Therapy Dates: 12/2018 Prior Therapy Facilty/Provider(s): [facility] in [place] Reason for Treatment: Alcohol Detox and Substance Abuse Treatment  Prior Outpatient Therapy Prior Outpatient Therapy: Yes Prior Therapy Dates: 08/2018 Prior Therapy Facilty/Provider(s): [facility] Reason for Treatment: Substance Abuse Disorder Does patient have an ACCT team?: No Does patient have Intensive In-House Services?  : No Does patient have [facility] services? : No Does patient have P4CC services?: No  ADL Screening (condition at time of admission) Patient's cognitive ability adequate to safely complete daily activities?: Yes Is the patient deaf or have difficulty hearing?: No Does the patient have difficulty seeing, even when wearing glasses/contacts?: No Does the patient have difficulty concentrating, remembering, or making decisions?: No Patient able to express need for assistance with ADLs?: Yes Does the patient have difficulty dressing or bathing?: No Independently performs ADLs?: Yes (appropriate for developmental age) Does the patient have difficulty walking or climbing stairs?: No Weakness of Legs: None Weakness of Arms/Hands: None  Home Assistive Devices/Equipment Home Assistive Devices/Equipment: None  Therapy Consults (therapy consults require a physician order) PT Evaluation Needed: No OT Evalulation Needed: No SLP Evaluation Needed: No Abuse/Neglect Assessment (Assessment to be complete while patient is  alone) Abuse/Neglect Assessment Can Be Completed: Yes Physical Abuse: Denies Verbal Abuse: Denies Sexual Abuse: Denies Exploitation of patient/patient's resources: Denies Self-Neglect: Denies Values / Beliefs Cultural Requests During Hospitalization: None Spiritual Requests During Hospitalization: None Consults Spiritual Care Consult Needed: No Social Work Consult Needed: No         Child/Adolescent Assessment Running Away Risk: Denies(Patient is an adult)  Disposition:  Disposition Initial Assessment Completed for this Encounter: Yes  On Site Evaluation by:   Reviewed with Physician:    Lilyan Gilford MS, LCAS, [facility], NCC, CCSI Therapeutic Triage Specialist 12/22/2018 3:45 PM

## 2018-12-22 NOTE — ED Notes (Signed)
Patient gone to CT at this time.  

## 2018-12-23 ENCOUNTER — Other Ambulatory Visit: Payer: Self-pay

## 2018-12-23 ENCOUNTER — Inpatient Hospital Stay (HOSPITAL_COMMUNITY)
Admission: AD | Admit: 2018-12-23 | Discharge: 2018-12-25 | DRG: 885 | Disposition: A | Discharge status: Home / Self Care | Payer: BC Managed Care – PPO | Source: Intra-hospital | Attending: Psychiatry | Admitting: Psychiatry

## 2018-12-23 ENCOUNTER — Encounter (HOSPITAL_COMMUNITY): Payer: Self-pay

## 2018-12-23 DIAGNOSIS — I110 Hypertensive heart disease with heart failure: Secondary | ICD-10-CM | POA: Diagnosis present

## 2018-12-23 DIAGNOSIS — Z8249 Family history of ischemic heart disease and other diseases of the circulatory system: Secondary | ICD-10-CM

## 2018-12-23 DIAGNOSIS — F1020 Alcohol dependence, uncomplicated: Secondary | ICD-10-CM

## 2018-12-23 DIAGNOSIS — F419 Anxiety disorder, unspecified: Secondary | ICD-10-CM | POA: Diagnosis not present

## 2018-12-23 DIAGNOSIS — Z7982 Long term (current) use of aspirin: Secondary | ICD-10-CM | POA: Diagnosis not present

## 2018-12-23 DIAGNOSIS — R45851 Suicidal ideations: Secondary | ICD-10-CM | POA: Diagnosis not present

## 2018-12-23 DIAGNOSIS — F1010 Alcohol abuse, uncomplicated: Secondary | ICD-10-CM | POA: Diagnosis not present

## 2018-12-23 DIAGNOSIS — F513 Sleepwalking [somnambulism]: Secondary | ICD-10-CM | POA: Diagnosis present

## 2018-12-23 DIAGNOSIS — I509 Heart failure, unspecified: Secondary | ICD-10-CM | POA: Diagnosis not present

## 2018-12-23 DIAGNOSIS — K219 Gastro-esophageal reflux disease without esophagitis: Secondary | ICD-10-CM | POA: Diagnosis not present

## 2018-12-23 DIAGNOSIS — E7800 Pure hypercholesterolemia, unspecified: Secondary | ICD-10-CM | POA: Diagnosis not present

## 2018-12-23 DIAGNOSIS — Z8349 Family history of other endocrine, nutritional and metabolic diseases: Secondary | ICD-10-CM | POA: Diagnosis not present

## 2018-12-23 DIAGNOSIS — Z79899 Other long term (current) drug therapy: Secondary | ICD-10-CM

## 2018-12-23 DIAGNOSIS — F332 Major depressive disorder, recurrent severe without psychotic features: Principal | ICD-10-CM | POA: Diagnosis present

## 2018-12-23 DIAGNOSIS — F329 Major depressive disorder, single episode, unspecified: Secondary | ICD-10-CM | POA: Diagnosis not present

## 2018-12-23 MED ORDER — LORAZEPAM 2 MG/ML IJ SOLN: 0.0000 mg | Freq: Four times a day (QID) | INTRAMUSCULAR | Status: DC

## 2018-12-23 MED ORDER — TRAZODONE HCL 50 MG PO TABS
50.0000 mg | ORAL_TABLET | Freq: Every evening | ORAL | Status: DC | PRN
  Filled 2018-12-23: qty 1

## 2018-12-23 MED ORDER — LORAZEPAM 1 MG PO TABS: 1.0000 mg | ORAL_TABLET | Freq: Three times a day (TID) | ORAL | Status: DC

## 2018-12-23 MED ORDER — VITAMIN B-1 100 MG PO TABS
100.0000 mg | ORAL_TABLET | Freq: Every day | ORAL | Status: DC
  Administered 2018-12-24 – 2018-12-25 (×2): 100 mg via ORAL
  Filled 2018-12-23 (×3): qty 1

## 2018-12-23 MED ORDER — ROSUVASTATIN CALCIUM 40 MG PO TABS
40.0000 mg | ORAL_TABLET | Freq: Every day | ORAL | Status: DC
  Administered 2018-12-23 – 2018-12-24 (×2): 40 mg via ORAL
  Filled 2018-12-23 (×4): qty 1

## 2018-12-23 MED ORDER — CHLORDIAZEPOXIDE HCL 25 MG PO CAPS
25.0000 mg | ORAL_CAPSULE | Freq: Four times a day (QID) | ORAL | Status: DC | PRN
  Administered 2018-12-23: 25 mg via ORAL
  Filled 2018-12-23: qty 1

## 2018-12-23 MED ORDER — ONDANSETRON 4 MG PO TBDP: 4.0000 mg | ORAL_TABLET | Freq: Four times a day (QID) | ORAL | Status: DC | PRN

## 2018-12-23 MED ORDER — ADULT MULTIVITAMIN W/MINERALS CH
1.0000 | ORAL_TABLET | Freq: Every day | ORAL | Status: DC
  Administered 2018-12-23 – 2018-12-25 (×3): 1 via ORAL
  Filled 2018-12-23 (×4): qty 1

## 2018-12-23 MED ORDER — HYDROXYZINE HCL 25 MG PO TABS
25.0000 mg | ORAL_TABLET | Freq: Four times a day (QID) | ORAL | Status: DC | PRN
  Filled 2018-12-23: qty 1

## 2018-12-23 MED ORDER — VITAMIN B-1 100 MG PO TABS: 100.0000 mg | ORAL_TABLET | Freq: Every day | ORAL | Status: DC

## 2018-12-23 MED ORDER — THIAMINE HCL 100 MG/ML IJ SOLN: 100.0000 mg | Freq: Every day | INTRAMUSCULAR | Status: DC

## 2018-12-23 MED ORDER — TICAGRELOR 60 MG PO TABS
60.0000 mg | ORAL_TABLET | Freq: Two times a day (BID) | ORAL | Status: DC
  Administered 2018-12-23 – 2018-12-25 (×4): 60 mg via ORAL
  Filled 2018-12-23 (×7): qty 1

## 2018-12-23 MED ORDER — LORAZEPAM 1 MG PO TABS
1.0000 mg | ORAL_TABLET | Freq: Four times a day (QID) | ORAL | Status: DC
  Administered 2018-12-23: 1 mg via ORAL
  Filled 2018-12-23: qty 1

## 2018-12-23 MED ORDER — LORAZEPAM 1 MG PO TABS: 0.0000 mg | ORAL_TABLET | Freq: Two times a day (BID) | ORAL | Status: DC

## 2018-12-23 MED ORDER — THIAMINE HCL 100 MG/ML IJ SOLN: 100.0000 mg | Freq: Once | INTRAMUSCULAR | Status: DC

## 2018-12-23 MED ORDER — LORAZEPAM 2 MG/ML IJ SOLN: 0.0000 mg | Freq: Two times a day (BID) | INTRAMUSCULAR | Status: DC

## 2018-12-23 MED ORDER — LORAZEPAM 1 MG PO TABS: 1.0000 mg | ORAL_TABLET | Freq: Two times a day (BID) | ORAL | Status: DC

## 2018-12-23 MED ORDER — ASPIRIN 81 MG PO TBEC
81.0000 mg | DELAYED_RELEASE_TABLET | Freq: Every day | ORAL | Status: DC
  Administered 2018-12-24 – 2018-12-25 (×2): 81 mg via ORAL
  Filled 2018-12-23 (×4): qty 1

## 2018-12-23 MED ORDER — NICOTINE 21 MG/24HR TD PT24
21.0000 mg | MEDICATED_PATCH | Freq: Once | TRANSDERMAL | Status: DC
  Administered 2018-12-23: 21 mg via TRANSDERMAL
  Filled 2018-12-23: qty 1

## 2018-12-23 MED ORDER — PANTOPRAZOLE SODIUM 40 MG PO TBEC
40.0000 mg | DELAYED_RELEASE_TABLET | Freq: Every day | ORAL | Status: DC
  Administered 2018-12-23 – 2018-12-25 (×3): 40 mg via ORAL
  Filled 2018-12-23 (×5): qty 1

## 2018-12-23 MED ORDER — LOPERAMIDE HCL 2 MG PO CAPS: 2.0000 mg | ORAL_CAPSULE | ORAL | Status: DC | PRN

## 2018-12-23 MED ORDER — NICOTINE 21 MG/24HR TD PT24
21.0000 mg | MEDICATED_PATCH | Freq: Every day | TRANSDERMAL | Status: DC
  Administered 2018-12-24: 08:00:00 21 mg via TRANSDERMAL
  Filled 2018-12-23 (×3): qty 1

## 2018-12-23 MED ORDER — SERTRALINE HCL 25 MG PO TABS
25.0000 mg | ORAL_TABLET | Freq: Every day | ORAL | Status: DC
  Administered 2018-12-23 – 2018-12-25 (×3): 25 mg via ORAL
  Filled 2018-12-23 (×4): qty 1

## 2018-12-23 MED ORDER — LORAZEPAM 1 MG PO TABS: 0.0000 mg | ORAL_TABLET | Freq: Four times a day (QID) | ORAL | Status: DC

## 2018-12-23 MED ORDER — LISINOPRIL 40 MG PO TABS
40.0000 mg | ORAL_TABLET | Freq: Every day | ORAL | Status: DC
  Administered 2018-12-23 – 2018-12-25 (×3): 40 mg via ORAL
  Filled 2018-12-23 (×3): qty 1
  Filled 2018-12-23: qty 2
  Filled 2018-12-23: qty 1

## 2018-12-23 MED ORDER — FOLIC ACID 1 MG PO TABS
1.0000 mg | ORAL_TABLET | Freq: Every day | ORAL | Status: DC
  Administered 2018-12-23 – 2018-12-25 (×3): 1 mg via ORAL
  Filled 2018-12-23 (×5): qty 1

## 2018-12-23 MED ORDER — LORAZEPAM 1 MG PO TABS: 1.0000 mg | ORAL_TABLET | Freq: Every day | ORAL | Status: DC

## 2018-12-23 NOTE — ED Provider Notes (Signed)
-----------------------------------------   4:59 AM on 12/23/2018 -----------------------------------------   Blood pressure 111/65, pulse 74, temperature 97.8 F (36.6 C), temperature source Oral, resp. rate 18, height 5\' 10"  (1.778 m), weight 86.2 kg, SpO2 97 %.  The patient is calm and cooperative at this time.  There have been no acute events since the last update.  Psychiatry is seen and is recommending inpatient admission for the patient.    Harvest Dark, MD 12/23/18 [phone]

## 2018-12-23 NOTE — ED Notes (Signed)
Meal was given to patient with a warm wash cloth to wash hands and face at this time.

## 2018-12-23 NOTE — ED Notes (Signed)
Hourly rounding reveals patient sleeping in hall bed. No complaints, stable, in no acute distress. Q15 minute rounds and monitoring via Rover and Officer to continue.  

## 2018-12-23 NOTE — BHH Suicide Risk Assessment (Signed)
[facility] Admission Suicide Risk Assessment   Nursing information obtained from:  Patient, Review of record Demographic factors:  Male, Caucasian, Living alone, Access to firearms Current Mental Status:  Suicidal ideation indicated by patient, Suicidal ideation indicated by others, Self-harm behaviors Loss Factors:  Decline in physical health, Loss of significant relationship Historical Factors:  Impulsivity Risk Reduction Factors:  Positive social support, Employed  Total Time spent with patient: 30 minutes Principal Problem: <principal problem not specified> Diagnosis:  Active Problems:   MDD (major depressive disorder), recurrent episode, severe (HCC)  Subjective Data: Patient is seen and examined.  Patient is a 58 year old male with a past psychiatric history significant for alcohol dependence and probable substance-induced mood disorder who originally presented on 12/22/2018 to the [facility] emergency department for evaluation.  The patient had presented via law enforcement after the patient's father had contacted them.  The patient had reportedly declined over the last several weeks.  His alcohol intake had increased.  He and his wife had recently separated.  He had recently been admitted to the [facility] behavioral health unit due to a picture that he sent to his separated spouse showing him having a gun to his head.  He was admitted from 6/8 to 6/10.  He was discharged on citalopram.  He stated that he started drinking after he left the hospital, but had not had any alcohol since Monday.  He denied suicidal ideation currently.  He stated that his brother and his mother had refused to give him his wallet or the keys to his car.  He stated he wishes to quit drinking permanently.  He was admitted to the hospital for evaluation and stabilization.  Continued Clinical Symptoms:  Alcohol Use Disorder Identification Test Final Score (AUDIT): 30 The "Alcohol  Use Disorders Identification Test", Guidelines for Use in Primary Care, Second Edition.  World Science writer [facility]). Score between 0-7:  no or low risk or alcohol related problems. Score between 8-15:  moderate risk of alcohol related problems. Score between 16-19:  high risk of alcohol related problems. Score 20 or above:  warrants further diagnostic evaluation for alcohol dependence and treatment.   CLINICAL FACTORS:   Depression:   Anhedonia Comorbid alcohol abuse/dependence Impulsivity Alcohol/Substance Abuse/Dependencies   Musculoskeletal: Strength & Muscle Tone: within normal limits Gait & Station: normal Patient leans: N/A  Psychiatric Specialty Exam: Physical Exam  Nursing note and vitals reviewed. Constitutional: He is oriented to person, place, and time. He appears well-developed and well-nourished.  HENT:  Head: Normocephalic and atraumatic.  Respiratory: Effort normal.  Neurological: He is alert and oriented to person, place, and time.    ROS  Blood pressure 123/84, pulse 85, temperature 99.2 F (37.3 C), temperature source Oral, resp. rate 18, height 5\' 10"  (1.778 m), weight 85.7 kg, SpO2 100 %.Body mass index is 27.12 kg/m.  General Appearance: Casual  Eye Contact:  Fair  Speech:  Normal Rate  Volume:  Normal  Mood:  Anxious  Affect:  Congruent  Thought Process:  Coherent and Descriptions of Associations: Circumstantial  Orientation:  Full (Time, Place, and Person)  Thought Content:  Logical  Suicidal Thoughts:  No  Homicidal Thoughts:  No  Memory:  Immediate;   Fair Recent;   Fair Remote;   Fair  Judgement:  Impaired  Insight:  Lacking  Psychomotor Activity:  Increased  Concentration:  Concentration: Fair and Attention Span: Fair  Recall:  [employer] of Knowledge:  Fair  Language:  Good  Akathisia:  Negative  Handed:  Right  AIMS (if indicated):     Assets:  Desire for Improvement Resilience  ADL's:  Intact  Cognition:  WNL  Sleep:          COGNITIVE FEATURES THAT CONTRIBUTE TO RISK:  None    SUICIDE RISK:   Minimal: No identifiable suicidal ideation.  Patients presenting with no risk factors but with morbid ruminations; may be classified as minimal risk based on the severity of the depressive symptoms  PLAN OF CARE: Patient is seen and examined.  Patient is a 58 year old male with a past psychiatric history significant for alcohol dependence, alcohol withdrawal, and as well substance-induced mood disorder.  He will be admitted to the hospital.  He will be integrated into the milieu.  He will be encouraged to attend groups.  He stated that his family practice doctor recommended Zoloft, secondary to the fact that it sounds like he got ill drinking with the citalopram.  We will start Zoloft at 25 mg p.o. daily and titrate during the course the hospitalization.  He will be placed on Librium 25 mg p.o. every 6 hours as needed withdrawal symptoms with a CIWA greater than 10.  He is also on medication for heart failure, hypertension, hyperlipidemia and GERD.  He is also on chronic anticoagulation secondary to coronary artery disease.  His aspirin, lisinopril, metoprolol, Protonix, Crestor and Brilinta will be continued.  Review of his laboratories showed significant increases in his AST and ALT.  It appears that when he was at [facility] his AST was 45 and his ALT are 40.  Unfortunately in the emergency department when they repeated his laboratories at [facility] on the 16th his AST was increased to 141, and his ALT up to 111.  His CBC was essentially normal.  A troponin was negative.  Despite saying that he had not had a drink since Monday the patient does smell like alcohol, and his blood alcohol on 616 was 290.  His drug screen was positive for benzodiazepines, and it is unclear whether or not he received that in the emergency department or from his primary care doctor.  Hopefully we can get him headed in the right direction.   I think his risk for significant withdrawal is decreased given the detox that he had are gone under at [facility].  I certify that inpatient services furnished can reasonably be expected to improve the patient's condition.   Sharma Covert, MD 12/23/2018, 4:21 PM

## 2018-12-23 NOTE — ED Notes (Signed)
Hourly rounding reveals patient sleeping in room. No complaints, stable, in no acute distress. Q15 minute rounds and monitoring via Rover and Officer to continue.  

## 2018-12-23 NOTE — Tx Team (Signed)
Initial Treatment Plan 12/23/2018 3:14 PM Dustin Velazquez VFM:[id number]    PATIENT STRESSORS: Marital or family conflict Substance abuse   PATIENT STRENGTHS: Average or above average intelligence Financial means General fund of knowledge Supportive family/friends   PATIENT IDENTIFIED PROBLEMS: "medications for depression"  "out-patient resources in [place]"  Substance Abuse  Suicide Risk               DISCHARGE CRITERIA:  Ability to meet basic life and health needs Adequate post-discharge living arrangements Improved stabilization in mood, thinking, and/or behavior Medical problems require only outpatient monitoring Motivation to continue treatment in a less acute level of care Need for constant or close observation no longer present Reduction of life-threatening or endangering symptoms to within safe limits Safe-care adequate arrangements made Verbal commitment to aftercare and medication compliance Withdrawal symptoms are absent or subacute and managed without 24-hour nursing intervention  PRELIMINARY DISCHARGE PLAN: Outpatient therapy  PATIENT/FAMILY INVOLVEMENT: This treatment plan has been presented to and reviewed with the patient, Dustin Velazquez.  The patient and family have been given the opportunity to ask questions and make suggestions.  Annia Friendly, RN 12/23/2018, 3:14 PM

## 2018-12-23 NOTE — Progress Notes (Addendum)
Patient ID: Dustin Velazquez, male   DOB: [birth year], 58 y.o.   MRN: [id number]  Admission Note  Patient admitted to the adult unit 300 hall under IVC from [facility] at 1130. Patient presented pleasant on admission but was tremulous. Patient states he is withdrawing from alcohol with his last use being yesterday. Patient reports he drinks 1/2 pint of tequila and 6-8 beers a day. Patient reports being at [facility] in-patient last week but relapsed. Patient also reports on-going suicidal thoughts due to his wife leaving him. Patient states he needs help "dealing with my emotions". Patient has been separated from his wife since February. Patient states this has left him feeling angry and he is coping with alcohol. Patient states he calls his ex-wife's house and gets angry when his step son tells him she is not available. Patient also reports a poor relationship with his daughter from a previous marriage because, "her mom made me the bad guy". Patient states his brother is a good support to him. Patient denies HI/AVH or legal issues. Patient states he has a private residence and works for the city of [employer]. While here, patient would like help with getting on antidepressant medications and getting out-patient resources in [place].   Per chart review, patient texted SI statements to his mother and it was his father who contacted police to bring patient to the hospital. Patient did not mention any of this during the assessment.  Skin assessment was complete and unremarkable. Patient belongings assessed for safety with no contraband found. Belongings secured in locker by security. Vitals obtained and WNL. CIWA score 8. Written consents obtained. Patient oriented to the unit and his room.   Patient denies concerns at this time and contracts for safety on the unit. Medications provided for detox. CIWA protocol in place. Will continue to support and monitor.

## 2018-12-23 NOTE — Consult Note (Signed)
[facility] Consult   Reason for Consult:  Suicidal with a gun Referring Physician:  Dr. Phillis HaggisMerlinda Patient Identification: Dustin Velazquez MRN:  [id number] Principal Diagnosis:  Diagnosis:   Suicidal behavior   Total Time spent with patient: 15 minutes Patient is seen, chart is reviewed.  Patient continues to seek help for his alcohol use.  He minimizes his symptoms of depression and his alcohol abuse.  He states that his father still has his gun, and he is trying to communicate less with his ex-wife.  He continues to endorse depression with vague suicidal thoughts.  He denies any active suicidal intent or plan at this time.  He denies HI or AVH.  He continues to meet criteria for inpatient psychiatric admission due to his high risk of suicide.  From initial psychiatric intake 12/22/2018: Subjective:  "My dad had sent the police to get me." HPI: Dustin Velazquez [name] is a 58 y.o. male [facility] via law enforcement on the involuntary commitment status (IVC). The patient reports that him and his wife separated 4 months ago.  He believes that his wife has blocked his number. He states due to him calling her a lot and requesting to see her.  He states she has ignoring his phone calls and messages.  And stated yesterday he called and his younger son answer and told him she was not there.  The patient states "I know she was right there."  He became very emotional due to "I have messed everything up.  I know I would never get her back."  The patient states today the police came to his house stating that his parents had called and said he had sent a threatening text stating he was going to hurt himself. The patient was seen face-to-face by this provider; chart reviewed and consulted with Dr. Juliette AlcideMelinda on 12/22/2018 due to the care of the patient. It was discussed with the provider that the patient does meet criteria to be admitted to the inpatient unit.  The patient states "I am still very  angry because my wife is not answering my phone calls.  Therefore, it is making me drink more."  He denies SI or HI.  Denies any prior history of suicide attempt.  He admits to being depressed due to what he has lost in his life.  He reports that he drinks anywhere between 6-12 beers a day and several shots of tequila.  The patient states "I am not one of those people who will drink a beer and be satisfy.  If I drink a beer.  I drink 12 cans of beer before I am satisfied. Last alcohol was prior to arrival.  Patient [place]  He denies history of complicated withdrawals.  He denies any medical complaints. On evaluation the patient is alert and oriented x4, calm and cooperative, and mood-congruent with affect. The patient does not appear to be responding to internal or external stimuli. Neither is the patient presenting with any delusional thinking. The patient denies auditory or visual hallucinations. The patient denies any suicidal, homicidal, or self-harm ideations. The patient is not presenting with any psychotic or paranoid behaviors. During an encounter with the patient, he/she was able to answer questions appropriately. Collateral was obtained by farther from TTS Counselor Mr. Kathrynn RunningManning; Per the report of the patient's father Dorene Sorrow(Jerry [phone]), for the last four weeks the patient has declined. He and his wife recently separated  and he has had a hard time dealing with it. His alcohol consumption has increased and his behaviors has drastically changed. He was [facility] inpatient with [facility], due texting a picture to his family with a gun to his head, stating he was having thoughts of killing his self. After patient was admitted to the Hospital, he stated, he was trying to get his wife's his attention so she could feel sorry for him.  Plan: The patient is a safety risk to self and does require psychiatric inpatient admission for stabilization and  treatment.    Parents describe on IVC paper work that the patient has been having erratic and dangerous behaviors; texting that he is going to harm himself.  Past Psychiatric History: Alcohol use disorder, dependence  Risk to Self: Suicidal Ideation: No Suicidal Intent: No Is patient at risk for suicide?: No Suicidal Plan?: No Access to Means: No Specify Access to Suicidal Means: Reports of none What has been your use of drugs/alcohol within the last 12 months?: Alcohol How many times?: 0 Other Self Harm Risks: Reports of none Triggers for Past Attempts: None known Intentional Self Injurious Behavior: NoneYes Risk to Others: Homicidal Ideation: No Thoughts of Harm to Others: No Current Homicidal Intent: No Current Homicidal Plan: No Access to Homicidal Means: No Identified Victim: Reports of none History of harm to others?: No Assessment of Violence: None Noted Violent Behavior Description: Reports of none Does patient have access to weapons?: No Criminal Charges Pending?: No Does patient have a court date: NoYes Prior Inpatient Therapy: Prior Inpatient Therapy: Yes Prior Therapy Dates: 12/2018 Prior Therapy Facilty/Provider(s): [facility] in [place] Reason for Treatment: Alcohol Detox and Substance Abuse TreatmentNone Prior Outpatient Therapy: Prior Outpatient Therapy: Yes Prior Therapy Dates: 08/2018 Prior Therapy Facilty/Provider(s): [facility] Reason for Treatment: Substance Abuse Disorder Does patient have an ACCT team?: No Does patient have Intensive In-House Services?  : No Does patient have [facility] services? : No Does patient have P4CC services?: NoPatient states he received a therapy session through [facility] approximately 2 months ago.  Past Medical History:  Past Medical History:  Diagnosis Date  . ETOH abuse   . GERD (gastroesophageal reflux disease)   . Hypercholesteremia   . Hypertension   . Sleep walking disorder      Past Surgical History:  Procedure Laterality Date  . APPENDECTOMY    . CORONARY STENT INTERVENTION N/A 12/30/2016   Procedure: Coronary Stent Intervention;  Surgeon: Iran OuchArida, Muhammad A, MD;  Location: [facility] INVASIVE CV LAB;  Service: Cardiovascular;  Laterality: N/A;  . KNEE SURGERY    . LEFT HEART CATH AND CORONARY ANGIOGRAPHY N/A 12/30/2016   Procedure: Left Heart Cath and Coronary Angiography;  Surgeon: Iran OuchArida, Muhammad A, MD;  Location: [facility] INVASIVE CV LAB;  Service: Cardiovascular;  Laterality: N/A;   Family History:  Family History  Problem Relation Age of Onset  . Hypertension Mother   . Hyperlipidemia Father    Family Psychiatric  History: None indicated  Social History:  Social History   Substance and Sexual Activity  Alcohol Use Yes  . Alcohol/week: 8.0 standard drinks  . Types: 8 Cans of beer per week     Social History   Substance and Sexual Activity  Drug Use No    Social History   Socioeconomic History  . Marital status: Married    Spouse name: Not on file  . Number of children: Not on file  . Years of education: Not on  file  . Highest education level: Not on file  Occupational History    Employer: [employer]  Social Needs  . Financial resource strain: Not on file  . Food insecurity    Worry: Not on file    Inability: Not on file  . Transportation needs    Medical: Not on file    Non-medical: Not on file  Tobacco Use  . Smoking status: Current Every Day Smoker    Packs/day: 0.25    Years: 30.00    Pack years: 7.50    Types: Cigarettes  . Smokeless tobacco: Never Used  Substance and Sexual Activity  . Alcohol use: Yes    Alcohol/week: 8.0 standard drinks    Types: 8 Cans of beer per week  . Drug use: No  . Sexual activity: Not on file  Lifestyle  . Physical activity    Days per week: Not on file    Minutes per session: Not on file  . Stress: Not on file  Relationships  . Social Musician on phone: Not on file    Gets  together: Not on file    Attends religious service: Not on file    Active member of club or organization: Not on file    Attends meetings of clubs or organizations: Not on file    Relationship status: Not on file  Other Topics Concern  . Not on file  Social History Narrative  . Not on file   Additional Social History:  Patient lives alone, he does have access to weapons.  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  Patient denies any substance use for the past 16 years.  Reports using marijuana regularly and cocaine on weekends 16 years ago.  Allergies:   Allergies  Allergen Reactions  . Compazine [Prochlorperazine Edisylate] Other (See Comments)    seizure    Labs:  Results for orders placed or performed during the hospital encounter of 12/22/18 (from the past 48 hour(s))  Urine Drug Screen, Qualitative     Status: Abnormal   Collection Time: 12/22/18  1:09 PM  Result Value Ref Range   Tricyclic, Ur Screen NONE DETECTED NONE DETECTED   Amphetamines, Ur Screen NONE DETECTED NONE DETECTED   MDMA (Ecstasy)Ur Screen NONE DETECTED NONE DETECTED   Cocaine Metabolite,Ur [place] NONE DETECTED NONE DETECTED   Opiate, Ur Screen NONE DETECTED NONE DETECTED   Phencyclidine (PCP) Ur S NONE DETECTED NONE DETECTED   Cannabinoid 50 Ng, Ur [place] NONE DETECTED NONE DETECTED   Barbiturates, Ur Screen NONE DETECTED NONE DETECTED   Benzodiazepine, Ur Scrn POSITIVE (A) NONE DETECTED   Methadone Scn, Ur NONE DETECTED NONE DETECTED    Comment: (NOTE) Tricyclics + metabolites, urine    Cutoff 1000 ng/mL Amphetamines + metabolites, urine  Cutoff 1000 ng/mL MDMA (Ecstasy), urine              Cutoff 500 ng/mL Cocaine Metabolite, urine          Cutoff 300 ng/mL Opiate + metabolites, urine        Cutoff 300 ng/mL Phencyclidine (PCP), urine         Cutoff 25 ng/mL Cannabinoid, urine                 Cutoff 50 ng/mL Barbiturates  + metabolites, urine  Cutoff 200 ng/mL Benzodiazepine, urine  Cutoff 200 ng/mL Methadone, urine                   Cutoff 300 ng/mL The urine drug screen provides only a preliminary, unconfirmed analytical test result and should not be used for non-medical purposes. Clinical consideration and professional judgment should be applied to any positive drug screen result due to possible interfering substances. A more specific alternate chemical method must be used in order to obtain a confirmed analytical result. Gas chromatography / mass spectrometry (GC/MS) is the preferred confirmat ory method. Performed at [facility], [address] [place], [place] [address]   Comprehensive metabolic panel     Status: Abnormal   Collection Time: 12/22/18  1:12 PM  Result Value Ref Range   Sodium 143 135 - 145 mmol/Velazquez   Potassium 4.7 3.5 - 5.1 mmol/Velazquez   Chloride 102 98 - 111 mmol/Velazquez   CO2 28 22 - 32 mmol/Velazquez   Glucose, Bld 107 (H) 70 - 99 mg/dL   BUN 7 6 - 20 mg/dL   Creatinine, Ser 6.040.79 0.61 - 1.24 mg/dL   Calcium 9.7 8.9 - 54.010.3 mg/dL   Total Protein 8.0 6.5 - 8.1 g/dL   Albumin 4.6 3.5 - 5.0 g/dL   AST 981141 (H) 15 - 41 U/Velazquez   ALT 111 (H) 0 - 44 U/Velazquez   Alkaline Phosphatase 67 38 - 126 U/Velazquez   Total Bilirubin 0.5 0.3 - 1.2 mg/dL   GFR calc non Af Amer >60 >60 mL/min   GFR calc Af Amer >60 >60 mL/min   Anion gap 13 5 - 15    Comment: Performed at [facility], [address] [place], [place] [address]  Ethanol     Status: Abnormal   Collection Time: 12/22/18  1:12 PM  Result Value Ref Range   Alcohol, Ethyl (B) 290 (H) <10 mg/dL    Comment: (NOTE) Lowest detectable limit for serum alcohol is 10 mg/dL. For medical purposes only. Performed at [facility], [address] [place], [place] [address]   cbc     Status: None   Collection Time: 12/22/18  1:12 PM  Result Value Ref Range   WBC 9.5 4.0 - 10.5 K/uL   RBC 4.36 4.22 - 5.81 MIL/uL   Hemoglobin  14.3 13.0 - 17.0 g/dL   HCT 21.342.5 08.639.0 - 57.852.0 %   MCV 97.5 80.0 - 100.0 fL   MCH 32.8 26.0 - 34.0 pg   MCHC 33.6 30.0 - 36.0 g/dL   RDW 46.914.4 62.911.5 - 52.815.5 %   Platelets 252 150 - 400 K/uL   nRBC 0.0 0.0 - 0.2 %    Comment: Performed at [facility], [address] [place], [place] [address]  SARS Coronavirus 2 (CEPHEID - Performed in [facility] hospital lab), Hosp Order     Status: None   Collection Time: 12/22/18  6:27 PM   Specimen: Nasopharyngeal Swab  Result Value Ref Range   SARS Coronavirus 2 NEGATIVE NEGATIVE    Comment: (NOTE) If result is NEGATIVE SARS-CoV-2 target nucleic acids are NOT DETECTED. The SARS-CoV-2 RNA is generally detectable in upper and lower  respiratory specimens during the acute phase of infection. The lowest  concentration of SARS-CoV-2 viral copies this assay can detect is 250  copies / mL. A negative result does not preclude SARS-CoV-2 infection  and should not be used as the sole basis for treatment or other  patient management decisions.  A negative result  may occur with  improper specimen collection / handling, submission of specimen other  than nasopharyngeal swab, presence of viral mutation(s) within the  areas targeted by this assay, and inadequate number of viral copies  (<250 copies / mL). A negative result must be combined with clinical  observations, patient history, and epidemiological information. If result is POSITIVE SARS-CoV-2 target nucleic acids are DETECTED. The SARS-CoV-2 RNA is generally detectable in upper and lower  respiratory specimens dur ing the acute phase of infection.  Positive  results are indicative of active infection with SARS-CoV-2.  Clinical  correlation with patient history and other diagnostic information is  necessary to determine patient infection status.  Positive results do  not rule out bacterial infection or co-infection with other viruses. If result is PRESUMPTIVE POSTIVE SARS-CoV-2 nucleic acids  MAY BE PRESENT.   A presumptive positive result was obtained on the submitted specimen  and confirmed on repeat testing.  While 2019 novel coronavirus  (SARS-CoV-2) nucleic acids may be present in the submitted sample  additional confirmatory testing may be necessary for epidemiological  and / or clinical management purposes  to differentiate between  SARS-CoV-2 and other Sarbecovirus currently known to infect humans.  If clinically indicated additional testing with an alternate test  methodology [id number]) is advised. The SARS-CoV-2 RNA is generally  detectable in upper and lower respiratory sp ecimens during the acute  phase of infection. The expected result is Negative. Fact Sheet for Patients:  BoilerBrush.com.cy Fact Sheet for Healthcare Providers: https://pope.com/ This test is not yet approved or cleared by the [place] FDA and has been authorized for detection and/or diagnosis of SARS-CoV-2 by FDA under an Emergency Use Authorization (EUA).  This EUA will remain in effect (meaning this test can be used) for the duration of the COVID-19 declaration under Section 564(b)(1) of the Act, 21 U.S.C. section 360bbb-3(b)(1), unless the authorization is terminated or revoked sooner. Performed at [facility], [address], [place], [place] [address]     Current Facility-Administered Medications  Medication Dose Route Frequency Provider Last Rate Last Dose  . aspirin EC tablet 81 mg  81 mg Oral Daily Arnaldo Natal, MD   81 mg at 12/23/18 8119  . lisinopril (ZESTRIL) tablet 40 mg  40 mg Oral Daily Arnaldo Natal, MD   40 mg at 12/23/18 1478  . LORazepam (ATIVAN) injection 0-4 mg  0-4 mg Intravenous Q6H Willy Eddy, MD   Stopped at 12/22/18 1930   Or  . LORazepam (ATIVAN) tablet 0-4 mg  0-4 mg Oral Q6H Willy Eddy, MD   1 mg at 12/23/18 2956  . [START ON 12/24/2018] LORazepam (ATIVAN) injection 0-4 mg  0-4 mg  Intravenous Q12H Willy Eddy, MD       Or  . Melene Muller ON 12/24/2018] LORazepam (ATIVAN) tablet 0-4 mg  0-4 mg Oral Q12H Willy Eddy, MD      . metoprolol tartrate (LOPRESSOR) tablet 50 mg  50 mg Oral BID Arnaldo Natal, MD   50 mg at 12/23/18 0908  . nicotine (NICODERM CQ - dosed in mg/24 hours) patch 21 mg  21 mg Transdermal Once Minna Antis, MD   21 mg at 12/23/18 0126  . pantoprazole (PROTONIX) EC tablet 40 mg  40 mg Oral Daily Arnaldo Natal, MD   40 mg at 12/23/18 0908  . thiamine (VITAMIN B-1) tablet 100 mg  100 mg Oral Daily Willy Eddy, MD   100 mg at 12/23/18 0908   Or  .  thiamine (B-1) injection 100 mg  100 mg Intravenous Daily Merlyn Lot, MD      . ticagrelor [facility]) tablet 60 mg  60 mg Oral BID Nena Polio, MD   60 mg at 12/23/18 8341   Current Outpatient Medications  Medication Sig Dispense Refill  . aspirin EC 81 MG tablet Take 1 tablet (81 mg total) by mouth daily. 30 tablet 1  . lisinopril (ZESTRIL) 40 MG tablet Take 1 tablet (40 mg total) by mouth daily. 30 tablet 1  . metoprolol tartrate (LOPRESSOR) 50 MG tablet Take 1 tablet (50 mg total) by mouth 2 (two) times daily. 60 tablet 1  . omeprazole (PRILOSEC) 20 MG capsule Take 20 mg by mouth daily.    . rosuvastatin (CRESTOR) 40 MG tablet Take 1 tablet (40 mg total) by mouth daily. (Patient taking differently: Take 40 mg by mouth every evening. ) 30 tablet 1  . ticagrelor (BRILINTA) 60 MG TABS tablet Take 1 tablet (60 mg total) by mouth 2 (two) times daily. 60 tablet 1  . citalopram (CELEXA) 20 MG tablet Take 1 tablet (20 mg total) by mouth daily. (Patient not taking: Reported on 12/22/2018) 30 tablet 1  . pantoprazole (PROTONIX) 40 MG tablet Take 1 tablet (40 mg total) by mouth daily. 30 tablet 1    Musculoskeletal: Strength & Muscle Tone: within normal limits Gait & Station: normal Patient leans: N/A  Psychiatric Specialty Exam: Physical Exam  Nursing note and vitals  reviewed. Constitutional: He is oriented to person, place, and time. He appears well-developed and well-nourished. No distress.  HENT:  Head: Normocephalic and atraumatic.  Eyes: EOM are normal.  Neck: Normal range of motion.  Cardiovascular: Normal rate and regular rhythm.  Respiratory: Effort normal. No respiratory distress.  Musculoskeletal: Normal range of motion.  Neurological: He is alert and oriented to person, place, and time.    Review of Systems  Constitutional: Negative.   Respiratory: Negative.   Cardiovascular: Negative.   Musculoskeletal: Negative.   Neurological: Positive for tremors.  Psychiatric/Behavioral: Positive for depression, substance abuse (alcohol) and suicidal ideas. Negative for hallucinations and memory loss. The patient is nervous/anxious and has insomnia.   All other systems reviewed and are negative.   Blood pressure (!) 146/80, pulse 80, temperature 98 F (36.7 C), resp. rate 18, height 5\' 10"  (1.778 m), weight 86.2 kg, SpO2 100 %.Body mass index is 27.26 kg/m.  General Appearance: Casual  Eye Contact:  Good  Speech:  Clear and Coherent and Normal Rate  Volume:  Normal  Mood:  Anxious and Depressed  Affect:  Congruent  Thought Process:  Goal Directed  Orientation:  Full (Time, Place, and Person)  Thought Content:  Logical and Hallucinations: None  Suicidal Thoughts:  Yes.  without intent/plan  Homicidal Thoughts:  No  Memory:  good  Judgement:  Impaired  Insight:  Shallow  Psychomotor Activity:  Normal  Concentration:  Concentration: Good  Recall:  Good  Fund of Knowledge:  Good  Language:  Good  Akathisia:  No  Handed:  Right  AIMS (if indicated):      Assets:  Agricultural consultant Housing Vocational/Educational  ADL's:  Intact  Cognition:  WNL  Sleep:   ok     Treatment Plan Summary: Daily contact with patient to assess and evaluate symptoms and progress in treatment and Continue involuntary.   High risk for suicide given age, gender, alcoholism, life stressors, isolation, and access to weapon with ideation/intent to complete suicide.  Disposition:  Recommend psychiatric Inpatient admission when medically cleared.  COVID screening completed Patient has been accepted to [facility] behavioral health.   Mariel Craft, MD 12/23/2018 10:13 AM

## 2018-12-23 NOTE — ED Notes (Signed)
Hourly rounding reveals patient in hall bed. No complaints, stable, in no acute distress. Q15 minute rounds and monitoring via Rover and Officer to continue.  

## 2018-12-23 NOTE — H&P (Signed)
Psychiatric Admission Assessment Adult  Patient Identification: Dustin Velazquez MRN:  [id number] Date of Evaluation:  12/23/2018 Chief Complaint:  MDD ALCOHOL USE DISORDER  MDD Principal Diagnosis: <principal problem not specified> Diagnosis:  Active Problems:   MDD (major depressive disorder), recurrent episode, severe (HCC)  History of Present Illness: [name] is a 58 year old male with history of alcohol abuse, depression, HTN, GERD, MI with stent, presenting for treatment after reportedly sending a suicidal email to his mother. The patient reports that his father showed up at his doorstep with the police. He was recently admitted to [facility] behavioral [facility] 12/14/18-12/16/18 for alcohol abuse and texting a picture to his ex-wife of himself holding a gun to his head. Patient denies sending any suicidal messages to his mother. He minimizes alcohol use, states "I've not been drinking much. Just a couple beers every few days." Chart review shows he presented to ED for a fall while drinking the day after discharge from [facility]. BAL on admission yesterday 290. Denies other drug use. UDS positive for BZDs as well. Patient does report some depression related to separation from his wife but overall minimizes symptoms of depression. Earlier notes indicate patient had reported SI. His wife left him earlier this year related to his alcohol use. He was recently prescribed Zoloft by an outpatient psychiatrist and is requesting to start that here. Denies withdrawal symptoms other than "shakiness." Denies SI/HI/AVH. AST 141. ALT 111.  Associated Signs/Symptoms: Depression Symptoms:  depressed mood, reported suicidal text messages (Hypo) Manic Symptoms:  denies Anxiety Symptoms:  denies Psychotic Symptoms:  denies PTSD Symptoms: Negative Total Time spent with patient: 30 minutes  Past Psychiatric History: History of alcohol use disorder. Admitted to [facility] behavioral [facility] 12/14/18-12/16/18 for  alcohol abuse with suicidal ideation- discharged on Celexa but did not take it. Denies other prior hospitalizations or history of suicide attempts.  Is the patient at risk to self? Yes.    Has the patient been a risk to self in the past 6 months? Yes.    Has the patient been a risk to self within the distant past? No.  Is the patient a risk to others? No.  Has the patient been a risk to others in the past 6 months? No.  Has the patient been a risk to others within the distant past? No.   Prior Inpatient Therapy:   Prior Outpatient Therapy:    Alcohol Screening: 1. How often do you have a drink containing alcohol?: 4 or more times a week 2. How many drinks containing alcohol do you have on a typical day when you are drinking?: 7, 8, or 9 3. How often do you have six or more drinks on one occasion?: Weekly AUDIT-C Score: 10 4. How often during the last year have you found that you were not able to stop drinking once you had started?: Weekly 5. How often during the last year have you failed to do what was normally expected from you becasue of drinking?: Monthly 6. How often during the last year have you needed a first drink in the morning to get yourself going after a heavy drinking session?: Daily or almost daily 7. How often during the last year have you had a feeling of guilt of remorse after drinking?: Daily or almost daily 8. How often during the last year have you been unable to remember what happened the night before because you had been drinking?: Weekly 9. Have you or someone else been injured as a  result of your drinking?: Yes, but not in the last year 10. Has a relative or friend or a doctor or another health worker been concerned about your drinking or suggested you cut down?: Yes, but not in the last year Alcohol Use Disorder Identification Test Final Score (AUDIT): 30 Alcohol Brief Interventions/Follow-up: Alcohol Education, Brief Advice, Continued Monitoring, Medication  Offered/Prescribed, Referral Substance Abuse History in the last 12 months:  Yes.   Consequences of Substance Abuse: Family Consequences:  separation from wife Withdrawal Symptoms:   Tremors Previous Psychotropic Medications: Yes  Psychological Evaluations: No  Past Medical History:  Past Medical History:  Diagnosis Date  . ETOH abuse   . GERD (gastroesophageal reflux disease)   . Hypercholesteremia   . Hypertension   . Sleep walking disorder     Past Surgical History:  Procedure Laterality Date  . APPENDECTOMY    . CORONARY STENT INTERVENTION N/A 12/30/2016   Procedure: Coronary Stent Intervention;  Surgeon: Iran OuchArida, Muhammad A, MD;  Location: [facility] INVASIVE CV LAB;  Service: Cardiovascular;  Laterality: N/A;  . KNEE SURGERY    . LEFT HEART CATH AND CORONARY ANGIOGRAPHY N/A 12/30/2016   Procedure: Left Heart Cath and Coronary Angiography;  Surgeon: Iran OuchArida, Muhammad A, MD;  Location: [facility] INVASIVE CV LAB;  Service: Cardiovascular;  Laterality: N/A;   Family History:  Family History  Problem Relation Age of Onset  . Hypertension Mother   . Hyperlipidemia Father    Family Psychiatric  History: Denies Tobacco Screening: Have you used any form of tobacco in the last 30 days? (Cigarettes, Smokeless Tobacco, Cigars, and/or Pipes): Yes Tobacco use, Select all that apply: 5 or more cigarettes per day Are you interested in Tobacco Cessation Medications?: Yes, will notify MD for an order Counseled patient on smoking cessation including recognizing danger situations, developing coping skills and basic information about quitting provided: Yes Social History:  Social History   Substance and Sexual Activity  Alcohol Use Yes  . Alcohol/week: 8.0 standard drinks  . Types: 8 Cans of beer per week   Comment: 1/2 pint tequila a day     Social History   Substance and Sexual Activity  Drug Use No    Additional Social History:                           Allergies:   Allergies   Allergen Reactions  . Compazine [Prochlorperazine Edisylate] Other (See Comments)    seizure   Lab Results:  Results for orders placed or performed during the hospital encounter of 12/22/18 (from the past 48 hour(s))  Urine Drug Screen, Qualitative     Status: Abnormal   Collection Time: 12/22/18  1:09 PM  Result Value Ref Range   Tricyclic, Ur Screen NONE DETECTED NONE DETECTED   Amphetamines, Ur Screen NONE DETECTED NONE DETECTED   MDMA (Ecstasy)Ur Screen NONE DETECTED NONE DETECTED   Cocaine Metabolite,Ur [place] NONE DETECTED NONE DETECTED   Opiate, Ur Screen NONE DETECTED NONE DETECTED   Phencyclidine (PCP) Ur S NONE DETECTED NONE DETECTED   Cannabinoid 50 Ng, Ur [place] NONE DETECTED NONE DETECTED   Barbiturates, Ur Screen NONE DETECTED NONE DETECTED   Benzodiazepine, Ur Scrn POSITIVE (A) NONE DETECTED   Methadone Scn, Ur NONE DETECTED NONE DETECTED    Comment: (NOTE) Tricyclics + metabolites, urine    Cutoff 1000 ng/mL Amphetamines + metabolites, urine  Cutoff 1000 ng/mL MDMA (Ecstasy), urine  Cutoff 500 ng/mL Cocaine Metabolite, urine          Cutoff 300 ng/mL Opiate + metabolites, urine        Cutoff 300 ng/mL Phencyclidine (PCP), urine         Cutoff 25 ng/mL Cannabinoid, urine                 Cutoff 50 ng/mL Barbiturates + metabolites, urine  Cutoff 200 ng/mL Benzodiazepine, urine              Cutoff 200 ng/mL Methadone, urine                   Cutoff 300 ng/mL The urine drug screen provides only a preliminary, unconfirmed analytical test result and should not be used for non-medical purposes. Clinical consideration and professional judgment should be applied to any positive drug screen result due to possible interfering substances. A more specific alternate chemical method must be used in order to obtain a confirmed analytical result. Gas chromatography / mass spectrometry (GC/MS) is the preferred confirmat ory method. Performed at [facility], [address] [place], [place] [address]   Comprehensive metabolic panel     Status: Abnormal   Collection Time: 12/22/18  1:12 PM  Result Value Ref Range   Sodium 143 135 - 145 mmol/L   Potassium 4.7 3.5 - 5.1 mmol/L   Chloride 102 98 - 111 mmol/L   CO2 28 22 - 32 mmol/L   Glucose, Bld 107 (H) 70 - 99 mg/dL   BUN 7 6 - 20 mg/dL   Creatinine, Ser 1.32 0.61 - 1.24 mg/dL   Calcium 9.7 8.9 - 44.0 mg/dL   Total Protein 8.0 6.5 - 8.1 g/dL   Albumin 4.6 3.5 - 5.0 g/dL   AST 102 (H) 15 - 41 U/L   ALT 111 (H) 0 - 44 U/L   Alkaline Phosphatase 67 38 - 126 U/L   Total Bilirubin 0.5 0.3 - 1.2 mg/dL   GFR calc non Af Amer >60 >60 mL/min   GFR calc Af Amer >60 >60 mL/min   Anion gap 13 5 - 15    Comment: Performed at [facility], [address], [place], [place] [address]  Ethanol     Status: Abnormal   Collection Time: 12/22/18  1:12 PM  Result Value Ref Range   Alcohol, Ethyl (B) 290 (H) <10 mg/dL    Comment: (NOTE) Lowest detectable limit for serum alcohol is 10 mg/dL. For medical purposes only. Performed at [facility], [address] [place], [place] [address]   cbc     Status: None   Collection Time: 12/22/18  1:12 PM  Result Value Ref Range   WBC 9.5 4.0 - 10.5 K/uL   RBC 4.36 4.22 - 5.81 MIL/uL   Hemoglobin 14.3 13.0 - 17.0 g/dL   HCT 47.4 25.9 - 56.3 %   MCV 97.5 80.0 - 100.0 fL   MCH 32.8 26.0 - 34.0 pg   MCHC 33.6 30.0 - 36.0 g/dL   RDW 87.5 64.3 - 32.9 %   Platelets 252 150 - 400 K/uL   nRBC 0.0 0.0 - 0.2 %    Comment: Performed at [facility], [address], [place], [place] [address]  SARS Coronavirus 2 (CEPHEID - Performed in [facility] hospital lab), Hosp Order     Status: None   Collection Time: 12/22/18  6:27 PM   Specimen: Nasopharyngeal Swab  Result Value Ref Range  SARS Coronavirus 2 NEGATIVE NEGATIVE    Comment: (NOTE) If result is NEGATIVE SARS-CoV-2 target nucleic acids are NOT DETECTED. The SARS-CoV-2 RNA is  generally detectable in upper and lower  respiratory specimens during the acute phase of infection. The lowest  concentration of SARS-CoV-2 viral copies this assay can detect is 250  copies / mL. A negative result does not preclude SARS-CoV-2 infection  and should not be used as the sole basis for treatment or other  patient management decisions.  A negative result may occur with  improper specimen collection / handling, submission of specimen other  than nasopharyngeal swab, presence of viral mutation(s) within the  areas targeted by this assay, and inadequate number of viral copies  (<250 copies / mL). A negative result must be combined with clinical  observations, patient history, and epidemiological information. If result is POSITIVE SARS-CoV-2 target nucleic acids are DETECTED. The SARS-CoV-2 RNA is generally detectable in upper and lower  respiratory specimens dur ing the acute phase of infection.  Positive  results are indicative of active infection with SARS-CoV-2.  Clinical  correlation with patient history and other diagnostic information is  necessary to determine patient infection status.  Positive results do  not rule out bacterial infection or co-infection with other viruses. If result is PRESUMPTIVE POSTIVE SARS-CoV-2 nucleic acids MAY BE PRESENT.   A presumptive positive result was obtained on the submitted specimen  and confirmed on repeat testing.  While 2019 novel coronavirus  (SARS-CoV-2) nucleic acids may be present in the submitted sample  additional confirmatory testing may be necessary for epidemiological  and / or clinical management purposes  to differentiate between  SARS-CoV-2 and other Sarbecovirus currently known to infect humans.  If clinically indicated additional testing with an alternate test  methodology [phone]) is advised. The SARS-CoV-2 RNA is generally  detectable in upper and lower respiratory sp ecimens during the acute  phase of  infection. The expected result is Negative. Fact Sheet for Patients:  BoilerBrush.com.cy Fact Sheet for Healthcare Providers: https://pope.com/ This test is not yet approved or cleared by the [place] FDA and has been authorized for detection and/or diagnosis of SARS-CoV-2 by FDA under an Emergency Use Authorization (EUA).  This EUA will remain in effect (meaning this test can be used) for the duration of the COVID-19 declaration under Section 564(b)(1) of the Act, 21 U.S.C. section 360bbb-3(b)(1), unless the authorization is terminated or revoked sooner. Performed at [facility], [address] [place], [place] [address]     Blood Alcohol level:  Lab Results  Component Value Date   ETH 290 (H) 12/22/2018   ETH 290 (H) 12/17/2018    Metabolic Disorder Labs:  No results found for: HGBA1C, MPG No results found for: PROLACTIN Lab Results  Component Value Date   CHOL 202 (H) 12/30/2016   TRIG 127 12/30/2016   HDL 49 12/30/2016   CHOLHDL 4.1 12/30/2016   VLDL 25 12/30/2016   LDLCALC 128 (H) 12/30/2016    Current Medications: Current Facility-Administered Medications  Medication Dose Route Frequency Provider Last Rate Last Dose  . chlordiazePOXIDE (LIBRIUM) capsule 25 mg  25 mg Oral QID PRN Antonieta Pert, MD      . folic acid (FOLVITE) tablet 1 mg  1 mg Oral Daily Antonieta Pert, MD      . hydrOXYzine (ATARAX/VISTARIL) tablet 25 mg  25 mg Oral Q6H PRN Antonieta Pert, MD      . loperamide (IMODIUM) capsule 2-4 mg  2-4 mg Oral PRN Sharma Covert, MD      . multivitamin with minerals tablet 1 tablet  1 tablet Oral Daily Sharma Covert, MD   1 tablet at 12/23/18 1300  . [START ON 12/24/2018] nicotine (NICODERM CQ - dosed in mg/24 hours) patch 21 mg  21 mg Transdermal Daily Sharma Covert, MD      . ondansetron (ZOFRAN-ODT) disintegrating tablet 4 mg  4 mg Oral Q6H PRN Sharma Covert, MD       . sertraline (ZOLOFT) tablet 25 mg  25 mg Oral Daily Sharma Covert, MD      . thiamine (B-1) injection 100 mg  100 mg Intramuscular Once Sharma Covert, MD      . Derrill Memo ON 12/24/2018] thiamine (VITAMIN B-1) tablet 100 mg  100 mg Oral Daily Sharma Covert, MD       PTA Medications: Medications Prior to Admission  Medication Sig Dispense Refill Last Dose  . aspirin EC 81 MG tablet Take 1 tablet (81 mg total) by mouth daily. 30 tablet 1   . citalopram (CELEXA) 20 MG tablet Take 1 tablet (20 mg total) by mouth daily. (Patient not taking: Reported on 12/22/2018) 30 tablet 1   . lisinopril (ZESTRIL) 40 MG tablet Take 1 tablet (40 mg total) by mouth daily. 30 tablet 1   . metoprolol tartrate (LOPRESSOR) 50 MG tablet Take 1 tablet (50 mg total) by mouth 2 (two) times daily. 60 tablet 1   . omeprazole (PRILOSEC) 20 MG capsule Take 20 mg by mouth daily.     . pantoprazole (PROTONIX) 40 MG tablet Take 1 tablet (40 mg total) by mouth daily. 30 tablet 1   . rosuvastatin (CRESTOR) 40 MG tablet Take 1 tablet (40 mg total) by mouth daily. (Patient taking differently: Take 40 mg by mouth every evening. ) 30 tablet 1   . ticagrelor (BRILINTA) 60 MG TABS tablet Take 1 tablet (60 mg total) by mouth 2 (two) times daily. 60 tablet 1     Musculoskeletal: Strength & Muscle Tone: within normal limits Gait & Station: normal Patient leans: N/A  Psychiatric Specialty Exam: Physical Exam  Nursing note and vitals reviewed. Constitutional: He is oriented to person, place, and time. He appears well-developed and well-nourished.  Cardiovascular: Normal rate.  Respiratory: Effort normal.  Neurological: He is alert and oriented to person, place, and time.    Review of Systems  Constitutional: Negative.   Respiratory: Negative for cough and shortness of breath.   Cardiovascular: Negative for chest pain.  Gastrointestinal: Negative for diarrhea, nausea and vomiting.  Neurological: Negative for headaches.   Psychiatric/Behavioral: Positive for depression and substance abuse. Negative for hallucinations and suicidal ideas. The patient is not nervous/anxious and does not have insomnia.     Blood pressure 123/84, pulse 85, temperature 99.2 F (37.3 C), temperature source Oral, resp. rate 18, height 5\' 10"  (1.778 m), weight 85.7 kg, SpO2 100 %.Body mass index is 27.12 kg/m.  General Appearance: Casual  Eye Contact:  Good  Speech:  Clear and Coherent and Normal Rate  Volume:  Normal  Mood:  Euthymic  Affect:  Congruent  Thought Process:  Coherent  Orientation:  Full (Time, Place, and Person)  Thought Content:  Logical  Suicidal Thoughts:  Denies  Homicidal Thoughts:  Denies  Memory:  Immediate;   Fair Recent;   Fair  Judgement:  Fair  Insight:  Lacking  Psychomotor Activity:  Normal  Concentration:  Concentration: Fair and  Attention Span: Fair  Recall:  [employer]  Fund of Knowledge:  Fair  Language:  Fair  Akathisia:  No  Handed:  Right  AIMS (if indicated):     Assets:  Communication Skills Housing Social Support  ADL's:  Intact  Cognition:  WNL  Sleep:       Treatment Plan Summary: Daily contact with patient to assess and evaluate symptoms and progress in treatment and Medication management  Inpatient hospitalization.  Start Zoloft 25 mg PO daily for depression Discontinue standing Ativan protocol; start Ativan 1 mg PO Q6HR PRN CIWA>10 Continue thiamine 100 mg PO daily for supplementation Continue Vistaril 25 mg PO Q6HR PRN anxiety Start trazodone 50 mg PO QHS PRN insomnia  Patient will participate in the therapeutic group milieu.  Discharge disposition in progress.   Observation Level/Precautions:  15 minute checks  Laboratory:  Reviewed  Psychotherapy:  Group therapy  Medications:  See MAR  Consultations:  PRN  Discharge Concerns:  Safety and stabilization  Estimated LOS: 3-5 days  Other:     Physician Treatment Plan for Primary Diagnosis: <principal problem not  specified> Long Term Goal(s): Improvement in symptoms so as ready for discharge  Short Term Goals: Ability to identify changes in lifestyle to reduce recurrence of condition will improve, Ability to verbalize feelings will improve and Ability to disclose and discuss suicidal ideas  Physician Treatment Plan for Secondary Diagnosis: Active Problems:   MDD (major depressive disorder), recurrent episode, severe (HCC)  Long Term Goal(s): Improvement in symptoms so as ready for discharge  Short Term Goals: Ability to demonstrate self-control will improve and Ability to identify and develop effective coping behaviors will improve  I certify that inpatient services furnished can reasonably be expected to improve the patient's condition.    Aldean BakerJanet E Akshaya Toepfer, NP 6/17/20204:13 PM

## 2018-12-23 NOTE — ED Notes (Signed)
[facility]  Dept  Called For  Transport  

## 2018-12-23 NOTE — Progress Notes (Signed)
Patient ID: Dustin Velazquez, male   DOB: June 07, 1961, 58 y.o.   MRN: [id number]  [place] NOVEL CORONAVIRUS (COVID-19) DAILY CHECK-OFF SYMPTOMS - answer yes or no to each - every day NO YES  Have you had a fever in the past 24 hours?  . Fever (Temp > 37.80C / 100F) X   Have you had any of these symptoms in the past 24 hours? . New Cough .  Sore Throat  .  Shortness of Breath .  Difficulty Breathing .  Unexplained Body Aches   X   Have you had any one of these symptoms in the past 24 hours not related to allergies?   . Runny Nose .  Nasal Congestion .  Sneezing   X   If you have had runny nose, nasal congestion, sneezing in the past 24 hours, has it worsened?  X   EXPOSURES - check yes or no X   Have you traveled outside the state in the past 14 days?  X   Have you been in contact with someone with a confirmed diagnosis of COVID-19 or PUI in the past 14 days without wearing appropriate PPE?  X   Have you been living in the same home as a person with confirmed diagnosis of COVID-19 or a PUI (household contact)?    X   Have you been diagnosed with COVID-19?    X              What to do next: Answered NO to all: Answered YES to anything:   Proceed with unit schedule Follow the [facility] Inpatient Flowsheet.

## 2018-12-23 NOTE — Consult Note (Addendum)
[facility] Face-to-Face Psychiatry Consult   Reason for Consult:  Suicidal with a gun Referring Physician:  Dr. Phillis Haggis Patient Identification: Dustin Velazquez MRN:  [id number] Principal Diagnosis:  Diagnosis:   Suicidal behavior   Total Time spent with patient: 1 hour  Subjective:  "My dad had sent the police to get me."  HPI: Dustin Velazquez is a 59 y.o. male presented to [facility] ED via law enforcement on the involuntary commitment status (IVC). The patient reports that him and his wife separated 4 months ago.  He believes that his wife has blocked his number. He states due to him calling her a lot and requesting to see her.  He states she has ignoring his phone calls and messages.  And stated yesterday he called and his younger son answer and told him she was not there.  The patient states "I know she was right there."  He became very emotional due to "I have messed everything up.  I know I would never get her back."  The patient states today the police came to his house stating that his parents had called and said he had sent a threatening text stating he was going to hurt himself. The patient was seen face-to-face by this provider; chart reviewed and consulted with Dr. Juliette Alcide on 12/22/2018 due to the care of the patient. It was discussed with the provider that the patient does meet criteria to be admitted to the inpatient unit.   The patient states "I am still very angry because my wife is not answering my phone calls.  Therefore, it is making me drink more."  He denies SI or HI.  Denies any prior history of suicide attempt.  He admits to being depressed due to what he has lost in his life.  He reports that he drinks anywhere between 6-12 beers a day and several shots of tequila.  The patient states "I am not one of those people who will drink a beer and be satisfy.  If I drink a beer.  I drink 12 cans of beer before I am satisfied. Last alcohol was prior to arrival.  Patient reports being on detox for  7 days couple weeks ago in [place].  He denies history of complicated withdrawals.  He denies any medical complaints.  On evaluation the patient is alert and oriented x4, calm and cooperative, and mood-congruent with affect. The patient does not appear to be responding to internal or external stimuli. Neither is the patient presenting with any delusional thinking. The patient denies auditory or visual hallucinations. The patient denies any suicidal, homicidal, or self-harm ideations. The patient is not presenting with any psychotic or paranoid behaviors. During an encounter with the patient, he/she was able to answer questions appropriately. Collateral was obtained by farther from TTS Counselor Mr. Kathrynn Running; Per the report of the patient's father Dorene Sorrow [phone]), for the last four weeks the patient has declined. He and his wife recently separated and he has had a hard time dealing with it. His alcohol consumption has increased and his behaviors has drastically changed. He was recently inpatient with [facility], due texting a picture to his family with a gun to his head, stating he was having thoughts of killing his self. After patient was admitted to the Hospital, he stated, he was trying to get his wife's his attention so she could feel sorry for him.  Plan: The patient is a safety risk to self and does require psychiatric inpatient admission for stabilization and  treatment.    Parents describe on IVC paper work that the patient has been having erratic and dangerous behaviors; texting that he is going to harm himself.  Past Psychiatric History: Alcohol use disorder, dependence  Risk to Self: Suicidal Ideation: No Suicidal Intent: No Is patient at risk for suicide?: No Suicidal Plan?: No Access to Means: No Specify Access to Suicidal Means: Reports of none What has been your use of drugs/alcohol within the last 12 months?: Alcohol How many times?: 0 Other Self Harm Risks: Reports of  none Triggers for Past Attempts: None known Intentional Self Injurious Behavior: NoneYes Risk to Others: Homicidal Ideation: No Thoughts of Harm to Others: No Current Homicidal Intent: No Current Homicidal Plan: No Access to Homicidal Means: No Identified Victim: Reports of none History of harm to others?: No Assessment of Violence: None Noted Violent Behavior Description: Reports of none Does patient have access to weapons?: No Criminal Charges Pending?: No Does patient have a court date: NoYes Prior Inpatient Therapy: Prior Inpatient Therapy: Yes Prior Therapy Dates: 12/2018 Prior Therapy Facilty/Provider(s): [facility] [place] Reason for Treatment: Alcohol Detox and Substance Abuse TreatmentNone Prior Outpatient Therapy: Prior Outpatient Therapy: Yes Prior Therapy Dates: 08/2018 Prior Therapy Facilty/Provider(s): [facility] Reason for Treatment: Substance Abuse Disorder Does patient have an ACCT team?: No Does patient have Intensive In-House Services?  : No Does patient have [facility] services? : No Does patient have P4CC services?: NoPatient states he received a therapy session through [facility] approximately 2 months ago.  Past Medical History:  Past Medical History:  Diagnosis Date  . ETOH abuse   . GERD (gastroesophageal reflux disease)   . Hypercholesteremia   . Hypertension   . Sleep walking disorder     Past Surgical History:  Procedure Laterality Date  . APPENDECTOMY    . CORONARY STENT INTERVENTION N/A 12/30/2016   Procedure: Coronary Stent Intervention;  Surgeon: Iran OuchArida, Muhammad A, MD;  Location: [facility] INVASIVE CV LAB;  Service: Cardiovascular;  Laterality: N/A;  . KNEE SURGERY    . LEFT HEART CATH AND CORONARY ANGIOGRAPHY N/A 12/30/2016   Procedure: Left Heart Cath and Coronary Angiography;  Surgeon: Iran OuchArida, Muhammad A, MD;  Location: [facility] INVASIVE CV LAB;  Service: Cardiovascular;  Laterality: N/A;   Family History:  Family  History  Problem Relation Age of Onset  . Hypertension Mother   . Hyperlipidemia Father    Family Psychiatric  History: None indicated  Social History:  Social History   Substance and Sexual Activity  Alcohol Use Yes  . Alcohol/week: 8.0 standard drinks  . Types: 8 Cans of beer per week     Social History   Substance and Sexual Activity  Drug Use No    Social History   Socioeconomic History  . Marital status: Married    Spouse name: Not on file  . Number of children: Not on file  . Years of education: Not on file  . Highest education level: Not on file  Occupational History    Employer: [employer]  Social Needs  . Financial resource strain: Not on file  . Food insecurity    Worry: Not on file    Inability: Not on file  . Transportation needs    Medical: Not on file    Non-medical: Not on file  Tobacco Use  . Smoking status: Current Every Day Smoker    Packs/day: 0.25    Years: 30.00    Pack years: 7.50  Types: Cigarettes  . Smokeless tobacco: Never Used  Substance and Sexual Activity  . Alcohol use: Yes    Alcohol/week: 8.0 standard drinks    Types: 8 Cans of beer per week  . Drug use: No  . Sexual activity: Not on file  Lifestyle  . Physical activity    Days per week: Not on file    Minutes per session: Not on file  . Stress: Not on file  Relationships  . Social Musicianconnections    Talks on phone: Not on file    Gets together: Not on file    Attends religious service: Not on file    Active member of club or organization: Not on file    Attends meetings of clubs or organizations: Not on file    Relationship status: Not on file  Other Topics Concern  . Not on file  Social History Narrative  . Not on file   Additional Social History:  Patient lives alone, he does have access to weapons.  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  He has been employed for 14 years in this position.  Patient denies any substance  use for the past 16 years.  Reports using marijuana regularly and cocaine on weekends 16 years ago.  Allergies:   Allergies  Allergen Reactions  . Compazine [Prochlorperazine Edisylate] Other (See Comments)    seizure    Labs:  Results for orders placed or performed during the hospital encounter of 12/22/18 (from the past 48 hour(s))  Urine Drug Screen, Qualitative     Status: Abnormal   Collection Time: 12/22/18  1:09 PM  Result Value Ref Range   Tricyclic, Ur Screen NONE DETECTED NONE DETECTED   Amphetamines, Ur Screen NONE DETECTED NONE DETECTED   MDMA (Ecstasy)Ur Screen NONE DETECTED NONE DETECTED   Cocaine Metabolite,Ur [place] NONE DETECTED NONE DETECTED   Opiate, Ur Screen NONE DETECTED NONE DETECTED   Phencyclidine (PCP) Ur S NONE DETECTED NONE DETECTED   Cannabinoid 50 Ng, Ur [place] NONE DETECTED NONE DETECTED   Barbiturates, Ur Screen NONE DETECTED NONE DETECTED   Benzodiazepine, Ur Scrn POSITIVE (A) NONE DETECTED   Methadone Scn, Ur NONE DETECTED NONE DETECTED    Comment: (NOTE) Tricyclics + metabolites, urine    Cutoff 1000 ng/mL Amphetamines + metabolites, urine  Cutoff 1000 ng/mL MDMA (Ecstasy), urine              Cutoff 500 ng/mL Cocaine Metabolite, urine          Cutoff 300 ng/mL Opiate + metabolites, urine        Cutoff 300 ng/mL Phencyclidine (PCP), urine         Cutoff 25 ng/mL Cannabinoid, urine                 Cutoff 50 ng/mL Barbiturates + metabolites, urine  Cutoff 200 ng/mL Benzodiazepine, urine              Cutoff 200 ng/mL Methadone, urine                   Cutoff 300 ng/mL The urine drug screen provides only a preliminary, unconfirmed analytical test result and should not be used for non-medical purposes. Clinical consideration and professional judgment should be applied to any positive drug screen result due to possible interfering substances. A more specific alternate chemical method must be used in order to obtain a confirmed analytical result. Gas  chromatography / mass spectrometry (GC/MS) is  the preferred confirmat ory method. Performed at [facility], [address] [place], [place] [address]   Comprehensive metabolic panel     Status: Abnormal   Collection Time: 12/22/18  1:12 PM  Result Value Ref Range   Sodium 143 135 - 145 mmol/L   Potassium 4.7 3.5 - 5.1 mmol/L   Chloride 102 98 - 111 mmol/L   CO2 28 22 - 32 mmol/L   Glucose, Bld 107 (H) 70 - 99 mg/dL   BUN 7 6 - 20 mg/dL   Creatinine, Ser 1.01 0.61 - 1.24 mg/dL   Calcium 9.7 8.9 - 75.1 mg/dL   Total Protein 8.0 6.5 - 8.1 g/dL   Albumin 4.6 3.5 - 5.0 g/dL   AST 025 (H) 15 - 41 U/L   ALT 111 (H) 0 - 44 U/L   Alkaline Phosphatase 67 38 - 126 U/L   Total Bilirubin 0.5 0.3 - 1.2 mg/dL   GFR calc non Af Amer >60 >60 mL/min   GFR calc Af Amer >60 >60 mL/min   Anion gap 13 5 - 15    Comment: Performed at [facility], [address] [place], [place] [address]  Ethanol     Status: Abnormal   Collection Time: 12/22/18  1:12 PM  Result Value Ref Range   Alcohol, Ethyl (B) 290 (H) <10 mg/dL    Comment: (NOTE) Lowest detectable limit for serum alcohol is 10 mg/dL. For medical purposes only. Performed at [facility], [address] [place], [place] [address]   cbc     Status: None   Collection Time: 12/22/18  1:12 PM  Result Value Ref Range   WBC 9.5 4.0 - 10.5 K/uL   RBC 4.36 4.22 - 5.81 MIL/uL   Hemoglobin 14.3 13.0 - 17.0 g/dL   HCT 53.6 14.4 - 31.5 %   MCV 97.5 80.0 - 100.0 fL   MCH 32.8 26.0 - 34.0 pg   MCHC 33.6 30.0 - 36.0 g/dL   RDW 40.0 86.7 - 61.9 %   Platelets 252 150 - 400 K/uL   nRBC 0.0 0.0 - 0.2 %    Comment: Performed at [facility], [address], [place], [place] [address]  SARS Coronavirus 2 (CEPHEID - Performed in [facility] hospital lab), Hosp Order     Status: None   Collection Time: 12/22/18  6:27 PM   Specimen: Nasopharyngeal Swab  Result Value Ref Range   SARS Coronavirus 2 NEGATIVE  NEGATIVE    Comment: (NOTE) If result is NEGATIVE SARS-CoV-2 target nucleic acids are NOT DETECTED. The SARS-CoV-2 RNA is generally detectable in upper and lower  respiratory specimens during the acute phase of infection. The lowest  concentration of SARS-CoV-2 viral copies this assay can detect is 250  copies / mL. A negative result does not preclude SARS-CoV-2 infection  and should not be used as the sole basis for treatment or other  patient management decisions.  A negative result may occur with  improper specimen collection / handling, submission of specimen other  than nasopharyngeal swab, presence of viral mutation(s) within the  areas targeted by this assay, and inadequate number of viral copies  (<250 copies / mL). A negative result must be combined with clinical  observations, patient history, and epidemiological information. If result is POSITIVE SARS-CoV-2 target nucleic acids are DETECTED. The SARS-CoV-2 RNA is generally detectable in upper and lower  respiratory specimens dur ing the acute phase of infection.  Positive  results  are indicative of active infection with SARS-CoV-2.  Clinical  correlation with patient history and other diagnostic information is  necessary to determine patient infection status.  Positive results do  not rule out bacterial infection or co-infection with other viruses. If result is PRESUMPTIVE POSTIVE SARS-CoV-2 nucleic acids MAY BE PRESENT.   A presumptive positive result was obtained on the submitted specimen  and confirmed on repeat testing.  While 2019 novel coronavirus  (SARS-CoV-2) nucleic acids may be present in the submitted sample  additional confirmatory testing may be necessary for epidemiological  and / or clinical management purposes  to differentiate between  SARS-CoV-2 and other Sarbecovirus currently known to infect humans.  If clinically indicated additional testing with an alternate test  methodology [phone]([id number]) is advised. The  SARS-CoV-2 RNA is generally  detectable in upper and lower respiratory sp ecimens during the acute  phase of infection. The expected result is Negative. Fact Sheet for Patients:  BoilerBrush.com.cyhttps://www.fda.gov/media/136312/download Fact Sheet for Healthcare Providers: https://pope.com/https://www.fda.gov/media/136313/download This test is not yet approved or cleared by the [place] FDA and has been authorized for detection and/or diagnosis of SARS-CoV-2 by FDA under an Emergency Use Authorization (EUA).  This EUA will remain in effect (meaning this test can be used) for the duration of the COVID-19 declaration under Section 564(b)(1) of the Act, 21 U.S.C. section 360bbb-3(b)(1), unless the authorization is terminated or revoked sooner. Performed at [facility], [address] [place], [place] [address]     Current Facility-Administered Medications  Medication Dose Route Frequency Provider Last Rate Last Dose  . aspirin EC tablet 81 mg  81 mg Oral Daily Arnaldo NatalMalinda, Paul F, MD      . lisinopril (ZESTRIL) tablet 40 mg  40 mg Oral Daily Arnaldo NatalMalinda, Paul F, MD      . LORazepam (ATIVAN) injection 0-4 mg  0-4 mg Intravenous Q6H Willy Eddyobinson, Patrick, MD   Stopped at 12/22/18 1930   Or  . LORazepam (ATIVAN) tablet 0-4 mg  0-4 mg Oral Q6H Willy Eddyobinson, Patrick, MD   2 mg at 12/22/18 2109  . [START ON 12/24/2018] LORazepam (ATIVAN) injection 0-4 mg  0-4 mg Intravenous Q12H Willy Eddyobinson, Patrick, MD       Or  . Melene Muller[START ON 12/24/2018] LORazepam (ATIVAN) tablet 0-4 mg  0-4 mg Oral Q12H Willy Eddyobinson, Patrick, MD      . metoprolol tartrate (LOPRESSOR) tablet 50 mg  50 mg Oral BID Arnaldo NatalMalinda, Paul F, MD   50 mg at 12/22/18 2224  . pantoprazole (PROTONIX) EC tablet 40 mg  40 mg Oral Daily Arnaldo NatalMalinda, Paul F, MD      . thiamine (VITAMIN B-1) tablet 100 mg  100 mg Oral Daily Willy Eddyobinson, Patrick, MD   100 mg at 12/22/18 1517   Or  . thiamine (B-1) injection 100 mg  100 mg Intravenous Daily Willy Eddyobinson, Patrick, MD      . ticagrelor [facility](BRILINTA)  tablet 60 mg  60 mg Oral BID Arnaldo NatalMalinda, Paul F, MD   60 mg at 12/22/18 2323   Current Outpatient Medications  Medication Sig Dispense Refill  . aspirin EC 81 MG tablet Take 1 tablet (81 mg total) by mouth daily. 30 tablet 1  . lisinopril (ZESTRIL) 40 MG tablet Take 1 tablet (40 mg total) by mouth daily. 30 tablet 1  . metoprolol tartrate (LOPRESSOR) 50 MG tablet Take 1 tablet (50 mg total) by mouth 2 (two) times daily. 60 tablet 1  . omeprazole (PRILOSEC) 20 MG capsule Take 20 mg by mouth daily.    .Marland Kitchen  rosuvastatin (CRESTOR) 40 MG tablet Take 1 tablet (40 mg total) by mouth daily. (Patient taking differently: Take 40 mg by mouth every evening. ) 30 tablet 1  . ticagrelor (BRILINTA) 60 MG TABS tablet Take 1 tablet (60 mg total) by mouth 2 (two) times daily. 60 tablet 1  . citalopram (CELEXA) 20 MG tablet Take 1 tablet (20 mg total) by mouth daily. (Patient not taking: Reported on 12/22/2018) 30 tablet 1  . pantoprazole (PROTONIX) 40 MG tablet Take 1 tablet (40 mg total) by mouth daily. 30 tablet 1    Musculoskeletal: Strength & Muscle Tone: within normal limits Gait & Station: normal Patient leans: N/A  Psychiatric Specialty Exam: Physical Exam  Nursing note and vitals reviewed. Constitutional: He is oriented to person, place, and time. He appears well-developed and well-nourished. No distress.  HENT:  Head: Normocephalic and atraumatic.  Eyes: EOM are normal.  Neck: Normal range of motion. Neck supple.  Cardiovascular: Normal rate and regular rhythm.  Respiratory: Effort normal. No respiratory distress.  Musculoskeletal: Normal range of motion.  Neurological: He is alert and oriented to person, place, and time.    Review of Systems  Constitutional: Negative.   Respiratory: Negative.   Cardiovascular: Negative.   Musculoskeletal: Negative.   Neurological: Positive for tremors.  Psychiatric/Behavioral: Positive for depression and substance abuse (alcohol). Negative for hallucinations  and memory loss. The patient is nervous/anxious and has insomnia.   All other systems reviewed and are negative.   Blood pressure 111/65, pulse 74, temperature 97.8 F (36.6 C), temperature source Oral, resp. rate 18, height 5\' 10"  (1.778 m), weight 86.2 kg, SpO2 97 %.Body mass index is 27.26 kg/m.  General Appearance: Casual  Eye Contact:  Good  Speech:  Clear and Coherent and Normal Rate  Volume:  Normal  Mood:  Anxious and Depressed  Affect:  Congruent  Thought Process:  Goal Directed  Orientation:  Full (Time, Place, and Person)  Thought Content:  Logical and Hallucinations: None  Suicidal Thoughts:  No  Homicidal Thoughts:  No  Memory:  good  Judgement:  Impaired  Insight:  Shallow  Psychomotor Activity:  Normal  Concentration:  Concentration: Good  Recall:  Good  Fund of Knowledge:  Good  Language:  Good  Akathisia:  No  Handed:  Right  AIMS (if indicated):      Assets:  Agricultural consultant Housing Vocational/Educational  ADL's:  Intact  Cognition:  WNL  Sleep:   ok     Treatment Plan Summary: Daily contact with patient to assess and evaluate symptoms and progress in treatment and Continue involuntary.  High risk for suicide given age, gender, alcoholism, life stressors, isolation, and access to weapon with ideation/intent to complete suicide.  Disposition: Recommend psychiatric Inpatient admission when medically cleared.  Lamont Dowdy, NP 12/23/2018 12:13 AM

## 2018-12-23 NOTE — ED Notes (Addendum)
Hourly rounding reveals patient in hall bed. No complaints, stable, in no acute distress. Q15 minute rounds and monitoring via Rover and Officer to continue.  

## 2018-12-23 NOTE — Progress Notes (Signed)
Patient ID: Dustin Velazquez, male   DOB: 1961/04/08, 58 y.o.   MRN: [id number]  Patient has been observed repeatedly using the phone and being belligerent to who he is speaking to. Patient's brother, Gomer France, states to secretary that the patient is calling constantly for him to bring patient his cell phone. Our secretary has confirmed to brother our hospital policy does not allow for cell phones on the unit. No patient information was provided about patient to family per patient request.

## 2018-12-24 MED ORDER — METOPROLOL TARTRATE 50 MG PO TABS
50.0000 mg | ORAL_TABLET | Freq: Once | ORAL | Status: AC
  Administered 2018-12-24: 12:00:00 50 mg via ORAL
  Filled 2018-12-24: qty 1

## 2018-12-24 MED ORDER — METOPROLOL TARTRATE 50 MG PO TABS
50.0000 mg | ORAL_TABLET | Freq: Two times a day (BID) | ORAL | Status: DC
  Administered 2018-12-24 – 2018-12-25 (×2): 50 mg via ORAL
  Filled 2018-12-24 (×4): qty 1

## 2018-12-24 NOTE — Progress Notes (Signed)
Patient ID: Dustin Velazquez, male   DOB: [birth year], 58 y.o.   MRN: [id number] DAR Note: Care of pt assumed at 1900 last night, pt in bed sleeping at the time, this RN went to his room to assess him and he was sleeping, and based on report received from previous RN, pt had been agitated, and repeatedly asking for his cell phone to be brought to unit by his family members.  It was therefore in the best interest of pt not to wake him up.  Respirations were even and unlabored, pt was observed to be sleeping during entire night with no signs/symptoms of distress.  Q15 minute checks for safety were maintained through entire shift.

## 2018-12-24 NOTE — [facility] Counselor (Signed)
CSW met with pt and completed PSA. He is agreeable to follow up with outpatient services at [facility]. He provided consent for staff to speak with his brother regarding discharge plan.    S. , LCSWA, MSW [facility]  [phone]   

## 2018-12-24 NOTE — BHH Counselor (Signed)
Adult Comprehensive Assessment  Patient ID: Dustin Velazquez, male   DOB: 09/26/1960, 58 y.o.   MRN: [id number]  Information Source: Information source: Patient  Current Stressors:  Patient states their primary concerns and needs for treatment are:: "drinking was my problem. They said I sent a text message to my mother. I have never sent a text in my life. I think they wanted me back in here. I am going to quit drinking, take my medicine. I want to go home." Patient states their goals for this hospitilization and ongoing recovery are:: "to listen to the doctor and go home" Educational / Learning stressors: high school diploma Employment / Job issues: employed  Family Relationships: Pt reports good relationship with family Museum/gallery curator / Lack of resources (include bankruptcy): employed Substance abuse: Pt denies  Living/Environment/Situation:  Living Arrangements: Alone- "I have been separated for four and a half months."  Living conditions (as described by patient or guardian): "I have some good neighbors" Who else lives in the home?: Pt lives alone How long has patient lived in current situation?: since Feb. 1, 2020 What is atmosphere in current home: Comfortable  Family History:  Marital status: Separated Separated, when?: 4 months ago Additional relationship information: Pt reports he has been separated for 4 months What is your sexual orientation?: heterosexual Has your sexual activity been affected by drugs, alcohol, medication, or emotional stress?: Pt denies Does patient have children?: Yes How many children?: 1 How is patient's relationship with their children?: 53 yo daughter who pt reports he does not have the best relationship with due to the daughter's mother turning the child against him  Childhood History:  By whom was/is the patient raised?: Both parents Description of patient's relationship with caregiver when they were a child: "I couldn't have asked for a better  relationship" Patient's description of current relationship with people who raised him/her: "great" How were you disciplined when you got in trouble as a child/adolescent?: "spanked" Does patient have siblings?: Yes Number of Siblings: 1 Description of patient's current relationship with siblings: older brother who pt reports a good relationship with Did patient suffer any verbal/emotional/physical/sexual abuse as a child?: No Did patient suffer from severe childhood neglect?: No Has patient ever been sexually abused/assaulted/raped as an adolescent or adult?: No Was the patient ever a victim of a crime or a disaster?: No Witnessed domestic violence?: No Has patient been effected by domestic violence as an adult?: No  Education:  Highest grade of school patient has completed: high school diploma Currently a Ship broker?: No Learning disability?: No  Employment/Work Situation:   Employment situation: Employed Where is patient currently employed?: [place] How long has patient been employed?: 15 years Patient's job has been impacted by current illness: No What is the longest time patient has a held a job?: 15 years Where was the patient employed at that time?: [place] Did You Receive Any Psychiatric Treatment/Services While in the [employer]?: No Are There Guns or Other Weapons in [place]?: No(Pt reports he had a pistol, but due to the incident his father now has the gun) Are These Psychologist, educational?: Yes  Financial Resources:   Financial resources: Income from employment, Private insurance Does patient have a representative payee or guardian?: No  Alcohol/Substance Abuse:   What has been your use of drugs/alcohol within the last 12 months?: "beer and liquor. I was drinking beer about five or six a day."  Alcohol/Substance Abuse Treatment Hx: Denies past history- "I went  to a detox at [facility] about a couple of weeks ago."  Has alcohol/substance  abuse ever caused legal problems?: No  Social Support System:   Forensic psychologist System: Production assistant, radio System: parents and friends Type of faith/religion: Chiropodist:   Leisure and Hobbies: "fishing, hanging out with my buddies, cleaning cars"  Strengths/Needs:   What is the patient's perception of their strengths?: "working, making friends" Patient states they can use these personal strengths during their treatment to contribute to their recovery: "I know I did something stupid and that it wasn't funny" Patient states these barriers may affect/interfere with their treatment: pt denies Other important information patient would like considered in planning for their treatment: Pt reports he will seek out [facility] on his own as he gets 6 free sessions through his job  Discharge Plan:   Currently receiving community mental health services: No Patient states concerns and preferences for aftercare planning are: PT reports he will set up his own follow up through his job and insurance Patient states they will know when they are safe and ready for discharge when: "I know it in the bottom of my heart" Does patient have access to transportation?: Yes Does patient have financial barriers related to discharge medications?: No Will patient be returning to same living situation after discharge?: Yes  Summary/Recommendations:   Summary and Recommendations (to be completed by the evaluator): Pt is a 58 yo male living in  [place], [place] [facility]. Pt presents to the hospital seeking treatment for suicide ideations. Pt is currently separated of 4 months, living alone, employed, has one child, and a good support system. Pt is agreeable to follow up with [facility] for aftercare appointments. Pt denies any abuse or trauma and denies SI/HI/AVH currently. Recommendations for pt include: crisis stabilization, therapeutic  milieu, encourage group attendance and participation, medication management for mood stabilization, and development for comprehensive mental wellness plan. CSW assessing for appropriate referrals.   Danaysha Kirn S. Amyiah Gaba, LCSWA, MSW [facility]: Child and Adolescent  [phone]

## 2018-12-24 NOTE — Progress Notes (Signed)
[facility] MD Progress Note  12/24/2018 12:33 PM MAHONRI SEIDEN  MRN:  [id number] Subjective:  "I'm great."  Mr. Butzin found sitting in the dayroom. He reports stable mood and says "I want to go home." Per nursing report he has been agitated and yelling at family members over the phone. He becomes mildly agitated today when discussing discharge and is insistent that he does not need to be here. I explained staff's concern about his reported suicidal statements and his immediate relapse on alcohol after being discharged from detox last week. Patient does acknowledge problem with alcohol but insists he does not need treatment or rehab to quit drinking. When asked about collateral information from family members, patient states "They will all just tell you to hold me here in treatment." He did provide consent for collateral from his brother. He denies SI/HI/AVH. Denies medication side effects. He reports shakiness but denies other withdrawal symptoms. His blood pressure is elevated this morning (162/90). He reports taking Lopressor 50 mg BID at home along with the lisinopril.  From admission H&P: Mr. Wessinger is a 58 year old male with history of alcohol abuse, depression, HTN, GERD, MI with stent, presenting for treatment after reportedly sending a suicidal email to his mother. The patient reports that his father showed up at his doorstep with the police. He was recently admitted to [facility] behavioral health 12/14/18-12/16/18 for alcohol abuse and texting a picture to his ex-wife of himself holding a gun to his head.   Principal Problem: <principal problem not specified> Diagnosis: Active Problems:   MDD (major depressive disorder), recurrent episode, severe ([place])  Total Time spent with patient: 20 minutes  Past Psychiatric History: See admission H&P  Past Medical History:  Past Medical History:  Diagnosis Date  . ETOH abuse   . GERD (gastroesophageal reflux disease)   . Hypercholesteremia   . Hypertension    . Sleep walking disorder     Past Surgical History:  Procedure Laterality Date  . APPENDECTOMY    . CORONARY STENT INTERVENTION N/A 12/30/2016   Procedure: Coronary Stent Intervention;  Surgeon: Wellington Hampshire, MD;  Location: [facility];  Service: Cardiovascular;  Laterality: N/A;  . KNEE SURGERY    . LEFT HEART CATH AND CORONARY ANGIOGRAPHY N/A 12/30/2016   Procedure: Left Heart Cath and Coronary Angiography;  Surgeon: Wellington Hampshire, MD;  Location: [facility];  Service: Cardiovascular;  Laterality: N/A;   Family History:  Family History  Problem Relation Age of Onset  . Hypertension Mother   . Hyperlipidemia Father    Family Psychiatric  History: See admission H&P Social History:  Social History   Substance and Sexual Activity  Alcohol Use Yes  . Alcohol/week: 8.0 standard drinks  . Types: 8 Cans of beer per week   Comment: 1/2 pint tequila a day     Social History   Substance and Sexual Activity  Drug Use No    Social History   Socioeconomic History  . Marital status: Married    Spouse name: Not on file  . Number of children: Not on file  . Years of education: Not on file  . Highest education level: Not on file  Occupational History    Employer: [employer]  Social Needs  . Financial resource strain: Not on file  . Food insecurity    Worry: Not on file    Inability: Not on file  . Transportation needs    Medical: Not on file  Non-medical: Not on file  Tobacco Use  . Smoking status: Current Every Day Smoker    Packs/day: 0.25    Years: 30.00    Pack years: 7.50    Types: Cigarettes  . Smokeless tobacco: Never Used  Substance and Sexual Activity  . Alcohol use: Yes    Alcohol/week: 8.0 standard drinks    Types: 8 Cans of beer per week    Comment: 1/2 pint tequila a day  . Drug use: No  . Sexual activity: Not Currently  Lifestyle  . Physical activity    Days per week: Not on file    Minutes per session: Not on file   . Stress: Not on file  Relationships  . Social Musicianconnections    Talks on phone: Not on file    Gets together: Not on file    Attends religious service: Not on file    Active member of club or organization: Not on file    Attends meetings of clubs or organizations: Not on file    Relationship status: Not on file  Other Topics Concern  . Not on file  Social History Narrative  . Not on file   Additional Social History:                         Sleep: Good  Appetite:  Good  Current Medications: Current Facility-Administered Medications  Medication Dose Route Frequency Provider Last Rate Last Dose  . aspirin EC tablet 81 mg  81 mg Oral Daily Antonieta Pertlary, Greg Lawson, MD   81 mg at 12/24/18 0802  . chlordiazePOXIDE (LIBRIUM) capsule 25 mg  25 mg Oral QID PRN Antonieta Pertlary, Greg Lawson, MD   25 mg at 12/23/18 1827  . folic acid (FOLVITE) tablet 1 mg  1 mg Oral Daily Antonieta Pertlary, Greg Lawson, MD   1 mg at 12/24/18 0802  . hydrOXYzine (ATARAX/VISTARIL) tablet 25 mg  25 mg Oral Q6H PRN Antonieta Pertlary, Greg Lawson, MD      . lisinopril (ZESTRIL) tablet 40 mg  40 mg Oral Daily Antonieta Pertlary, Greg Lawson, MD   40 mg at 12/24/18 0802  . loperamide (IMODIUM) capsule 2-4 mg  2-4 mg Oral PRN Antonieta Pertlary, Greg Lawson, MD      . metoprolol tartrate (LOPRESSOR) tablet 50 mg  50 mg Oral BID Aldean BakerSykes, Stephene Alegria E, NP      . multivitamin with minerals tablet 1 tablet  1 tablet Oral Daily Antonieta Pertlary, Greg Lawson, MD   1 tablet at 12/24/18 0802  . nicotine (NICODERM CQ - dosed in mg/24 hours) patch 21 mg  21 mg Transdermal Daily Antonieta Pertlary, Greg Lawson, MD   21 mg at 12/24/18 0801  . ondansetron (ZOFRAN-ODT) disintegrating tablet 4 mg  4 mg Oral Q6H PRN Antonieta Pertlary, Greg Lawson, MD      . pantoprazole (PROTONIX) EC tablet 40 mg  40 mg Oral Daily Antonieta Pertlary, Greg Lawson, MD   40 mg at 12/24/18 0802  . rosuvastatin (CRESTOR) tablet 40 mg  40 mg Oral q1800 Antonieta Pertlary, Greg Lawson, MD   40 mg at 12/23/18 1827  . sertraline (ZOLOFT) tablet 25 mg  25 mg Oral Daily Antonieta Pertlary, Greg  Lawson, MD   25 mg at 12/24/18 0802  . thiamine (B-1) injection 100 mg  100 mg Intramuscular Once Antonieta Pertlary, Greg Lawson, MD      . thiamine (VITAMIN B-1) tablet 100 mg  100 mg Oral Daily Antonieta Pertlary, Greg Lawson, MD   100 mg at 12/24/18 0802  .  ticagrelor (BRILINTA) tablet 60 mg  60 mg Oral BID Antonieta Pertlary, Greg Lawson, MD   60 mg at 12/24/18 0802  . traZODone (DESYREL) tablet 50 mg  50 mg Oral QHS PRN Aldean BakerSykes, Shun Pletz E, NP        Lab Results:  Results for orders placed or performed during the hospital encounter of 12/22/18 (from the past 48 hour(s))  Urine Drug Screen, Qualitative     Status: Abnormal   Collection Time: 12/22/18  1:09 PM  Result Value Ref Range   Tricyclic, Ur Screen NONE DETECTED NONE DETECTED   Amphetamines, Ur Screen NONE DETECTED NONE DETECTED   MDMA (Ecstasy)Ur Screen NONE DETECTED NONE DETECTED   Cocaine Metabolite,Ur [place] NONE DETECTED NONE DETECTED   Opiate, Ur Screen NONE DETECTED NONE DETECTED   Phencyclidine (PCP) Ur S NONE DETECTED NONE DETECTED   Cannabinoid 50 Ng, Ur [place] NONE DETECTED NONE DETECTED   Barbiturates, Ur Screen NONE DETECTED NONE DETECTED   Benzodiazepine, Ur Scrn POSITIVE (A) NONE DETECTED   Methadone Scn, Ur NONE DETECTED NONE DETECTED    Comment: (NOTE) Tricyclics + metabolites, urine    Cutoff 1000 ng/mL Amphetamines + metabolites, urine  Cutoff 1000 ng/mL MDMA (Ecstasy), urine              Cutoff 500 ng/mL Cocaine Metabolite, urine          Cutoff 300 ng/mL Opiate + metabolites, urine        Cutoff 300 ng/mL Phencyclidine (PCP), urine         Cutoff 25 ng/mL Cannabinoid, urine                 Cutoff 50 ng/mL Barbiturates + metabolites, urine  Cutoff 200 ng/mL Benzodiazepine, urine              Cutoff 200 ng/mL Methadone, urine                   Cutoff 300 ng/mL The urine drug screen provides only a preliminary, unconfirmed analytical test result and should not be used for non-medical purposes. Clinical consideration and professional judgment should be  applied to any positive drug screen result due to possible interfering substances. A more specific alternate chemical method must be used in order to obtain a confirmed analytical result. Gas chromatography / mass spectrometry (GC/MS) is the preferred confirmat ory method. Performed at [facility], [address] [place], [place] [address]   Comprehensive metabolic panel     Status: Abnormal   Collection Time: 12/22/18  1:12 PM  Result Value Ref Range   Sodium 143 135 - 145 mmol/L   Potassium 4.7 3.5 - 5.1 mmol/L   Chloride 102 98 - 111 mmol/L   CO2 28 22 - 32 mmol/L   Glucose, Bld 107 (H) 70 - 99 mg/dL   BUN 7 6 - 20 mg/dL   Creatinine, Ser 6.040.79 0.61 - 1.24 mg/dL   Calcium 9.7 8.9 - 54.010.3 mg/dL   Total Protein 8.0 6.5 - 8.1 g/dL   Albumin 4.6 3.5 - 5.0 g/dL   AST 981141 (H) 15 - 41 U/L   ALT 111 (H) 0 - 44 U/L   Alkaline Phosphatase 67 38 - 126 U/L   Total Bilirubin 0.5 0.3 - 1.2 mg/dL   GFR calc non Af Amer >60 >60 mL/min   GFR calc Af Amer >60 >60 mL/min   Anion gap 13 5 - 15    Comment: Performed at [facility], [address] [place], [place] [address]  Ethanol     Status: Abnormal   Collection Time: 12/22/18  1:12 PM  Result Value Ref Range   Alcohol, Ethyl (B) 290 (H) <10 mg/dL    Comment: (NOTE) Lowest detectable limit for serum alcohol is 10 mg/dL. For medical purposes only. Performed at [facility], [address] [place], [place] [address]   cbc     Status: None   Collection Time: 12/22/18  1:12 PM  Result Value Ref Range   WBC 9.5 4.0 - 10.5 K/uL   RBC 4.36 4.22 - 5.81 MIL/uL   Hemoglobin 14.3 13.0 - 17.0 g/dL   HCT 09.842.5 11.939.0 - 14.752.0 %   MCV 97.5 80.0 - 100.0 fL   MCH 32.8 26.0 - 34.0 pg   MCHC 33.6 30.0 - 36.0 g/dL   RDW 82.914.4 56.211.5 - 13.015.5 %   Platelets 252 150 - 400 K/uL   nRBC 0.0 0.0 - 0.2 %    Comment: Performed at [facility], [address] [place], [place] [address]  SARS Coronavirus 2 (CEPHEID -  Performed in [facility] hospital lab), Hosp Order     Status: None   Collection Time: 12/22/18  6:27 PM   Specimen: Nasopharyngeal Swab  Result Value Ref Range   SARS Coronavirus 2 NEGATIVE NEGATIVE    Comment: (NOTE) If result is NEGATIVE SARS-CoV-2 target nucleic acids are NOT DETECTED. The SARS-CoV-2 RNA is generally detectable in upper and lower  respiratory specimens during the acute phase of infection. The lowest  concentration of SARS-CoV-2 viral copies this assay can detect is 250  copies / mL. A negative result does not preclude SARS-CoV-2 infection  and should not be used as the sole basis for treatment or other  patient management decisions.  A negative result may occur with  improper specimen collection / handling, submission of specimen other  than nasopharyngeal swab, presence of viral mutation(s) within the  areas targeted by this assay, and inadequate number of viral copies  (<250 copies / mL). A negative result must be combined with clinical  observations, patient history, and epidemiological information. If result is POSITIVE SARS-CoV-2 target nucleic acids are DETECTED. The SARS-CoV-2 RNA is generally detectable in upper and lower  respiratory specimens dur ing the acute phase of infection.  Positive  results are indicative of active infection with SARS-CoV-2.  Clinical  correlation with patient history and other diagnostic information is  necessary to determine patient infection status.  Positive results do  not rule out bacterial infection or co-infection with other viruses. If result is PRESUMPTIVE POSTIVE SARS-CoV-2 nucleic acids MAY BE PRESENT.   A presumptive positive result was obtained on the submitted specimen  and confirmed on repeat testing.  While 2019 novel coronavirus  (SARS-CoV-2) nucleic acids may be present in the submitted sample  additional confirmatory testing may be necessary for epidemiological  and / or clinical management purposes  to  differentiate between  SARS-CoV-2 and other Sarbecovirus currently known to infect humans.  If clinically indicated additional testing with an alternate test  methodology [phone]([id number]) is advised. The SARS-CoV-2 RNA is generally  detectable in upper and lower respiratory sp ecimens during the acute  phase of infection. The expected result is Negative. Fact Sheet for Patients:  BoilerBrush.com.cyhttps://www.fda.gov/media/136312/download Fact Sheet for Healthcare Providers: https://pope.com/https://www.fda.gov/media/136313/download This test is not yet approved or cleared by the [place] FDA and has been authorized for detection and/or diagnosis of SARS-CoV-2 by FDA under an Emergency Use  Authorization (EUA).  This EUA will remain in effect (meaning this test can be used) for the duration of the COVID-19 declaration under Section 564(b)(1) of the Act, 21 U.S.C. section 360bbb-3(b)(1), unless the authorization is terminated or revoked sooner. Performed at [facility], [address] [place], [place] [address]     Blood Alcohol level:  Lab Results  Component Value Date   ETH 290 (H) 12/22/2018   ETH 290 (H) 12/17/2018    Metabolic Disorder Labs: No results found for: HGBA1C, MPG No results found for: PROLACTIN Lab Results  Component Value Date   CHOL 202 (H) 12/30/2016   TRIG 127 12/30/2016   HDL 49 12/30/2016   CHOLHDL 4.1 12/30/2016   VLDL 25 12/30/2016   LDLCALC 128 (H) 12/30/2016    Physical Findings: AIMS: Facial and Oral Movements Muscles of Facial Expression: None, normal Lips and Perioral Area: None, normal Jaw: None, normal Tongue: None, normal,Extremity Movements Upper (arms, wrists, hands, fingers): None, normal Lower (legs, knees, ankles, toes): None, normal, Trunk Movements Neck, shoulders, hips: None, normal, Overall Severity Severity of abnormal movements (highest score from questions above): None, normal Incapacitation due to abnormal movements: None, normal Patient's  awareness of abnormal movements (rate only patient's report): No Awareness, Dental Status Current problems with teeth and/or dentures?: No Does patient usually wear dentures?: No  CIWA:  CIWA-Ar Total: 0 COWS:  COWS Total Score: 7  Musculoskeletal: Strength & Muscle Tone: within normal limits Gait & Station: normal Patient leans: N/A  Psychiatric Specialty Exam: Physical Exam  Nursing note and vitals reviewed. Constitutional: He is oriented to person, place, and time. He appears well-developed and well-nourished.  Respiratory: Effort normal.  Neurological: He is alert and oriented to person, place, and time.    Review of Systems  Constitutional: Negative.   Respiratory: Negative for cough and shortness of breath.   Cardiovascular: Negative for chest pain.  Gastrointestinal: Negative for diarrhea, nausea and vomiting.  Neurological: Positive for tremors. Negative for sensory change and headaches.  Psychiatric/Behavioral: Positive for substance abuse. Negative for depression, hallucinations and suicidal ideas. The patient is not nervous/anxious and does not have insomnia.     Blood pressure (!) 134/94, pulse 72, temperature 99.2 F (37.3 C), temperature source Oral, resp. rate 18, height  (1.778 m), weight 85.7 kg, SpO2 100 %.Body mass index is 27.12 kg/m.  General Appearance: Casual  Eye Contact:  Good  Speech:  Clear and Coherent and Pressured  Volume:  Increased  Mood:  Euthymic  Affect:  Non-Congruent and Irritable  Thought Process:  Coherent and Goal Directed  Orientation:  Full (Time, Place, and Person)  Thought Content:  Logical  Suicidal Thoughts:  denies  Homicidal Thoughts:  denies  Memory:  Immediate;   Fair Recent;   Poor  Judgement:  Impaired  Insight:  Lacking  Psychomotor Activity:  Normal  Concentration:  Concentration: Fair and Attention Span: Fair  Recall:  [employer] of Knowledge:  Fair  Language:  Good  Akathisia:  No  Handed:  Right  AIMS  (if indicated):     Assets:  Communication Skills Housing Social Support  ADL's:  Intact  Cognition:  WNL  Sleep:  Number of Hours: 6.75     Treatment Plan Summary: Daily contact with patient to assess and evaluate symptoms and progress in treatment and Medication management   Continue inpatient hospitalization.  Repeat LFTs tomorrow. Start Lopressor 50 mg PO BID for HTN (home med) Continue Zoloft 25 mg PO  daily for mood Continue lisinopril 40 mg PO daily for HTN Continue Protonix 40 mg PO daily for GERD Continue Crestor 40 mg PO daily for HLD Continue ASA 81 mg PO daily for blood thinner Continue Brilinta 60 mg PO BID for blood thinner Continue Librium 25 mg PO QID PRN CIWA>10 for ETOH withdrawal Continue Vistaril 245 mg PO Q6HR PRN anxiety Continue trazodone 50 mg PO QHS PRN insomnia  Patient will participate in the therapeutic group milieu.  Discharge disposition in progress.   Aldean Baker, NP 12/24/2018, 12:33 PM

## 2018-12-24 NOTE — Progress Notes (Signed)
[place] NOVEL CORONAVIRUS (COVID-19) DAILY CHECK-OFF SYMPTOMS - answer yes or no to each - every day NO YES  Have you had a fever in the past 24 hours?  . Fever (Temp > 37.80C / 100F) X   Have you had any of these symptoms in the past 24 hours? . New Cough .  Sore Throat  .  Shortness of Breath .  Difficulty Breathing .  Unexplained Body Aches   X   Have you had any one of these symptoms in the past 24 hours not related to allergies?   . Runny Nose .  Nasal Congestion .  Sneezing   X   If you have had runny nose, nasal congestion, sneezing in the past 24 hours, has it worsened?  X   EXPOSURES - check yes or no X   Have you traveled outside the state in the past 14 days?  X   Have you been in contact with someone with a confirmed diagnosis of COVID-19 or PUI in the past 14 days without wearing appropriate PPE?  X   Have you been living in the same home as a person with confirmed diagnosis of COVID-19 or a PUI (household contact)?    X   Have you been diagnosed with COVID-19?    X              What to do next: Answered NO to all: Answered YES to anything:   Proceed with unit schedule Follow the [facility] Inpatient Flowsheet.   

## 2018-12-24 NOTE — Progress Notes (Signed)
D:  Patient's self inventory sheet, patient has fair sleep, no sleep medication.  Fair appetite, normal energy level, good concentration.  Denied depression, hopeless and anxiety.  Denied withdrawals.  Denied SI.  Denied physical problems.  Denied physical pain.  Goal is discharge.  Plans to pray.   A:  Medications administered per MD orders.  Emotional support and encouragement given patient. R:  Denied SI and HI, contracts for safety.  Denied A/V hallucinations.  Safety maintained with 15 minute checks.

## 2018-12-24 NOTE — Plan of Care (Signed)
Nurse discussed anxiety, depression, coping skills with patient. 

## 2018-12-24 NOTE — Plan of Care (Signed)
Patient was pleasant upon approach. Denied SI HI AVH. Claims he slept okay. Voiced no other complaints.  Patient is compliant with medications. Denies any side effects. Safety is maintained with 15 minute checks as well as environmental checks. Will continue to monitor and assess.  Problem: Education: Goal: Knowledge of [place] General Education information/materials will improve Outcome: Progressing Goal: Emotional status will improve Outcome: Progressing Goal: Mental status will improve Outcome: Progressing Goal: Verbalization of understanding the information provided will improve Outcome: Progressing   Problem: Activity: Goal: Interest or engagement in activities will improve Outcome: Progressing

## 2018-12-24 NOTE — Progress Notes (Addendum)
Dustin Velazquez observe in room, pacing, restless, and agitated.Pt is preoccupied about discharge since he has been here. Pt states he needs to get to his cardiologist appointment tomorrow by 1500. Pt states he needs to leave by noon. Pt was encourage to talk to provider tomorrow. Pt denies SI/HI/AVH. Pt states he will follow up with counseling OPT at [facility] and attend [place] meetings. Pt states he will not drink anymore and will try to work on himself. Pt denies SI/HI/AVH. Support offered. Pt was able to calm down after 1:1 interaction. Will continue with POC.

## 2018-12-25 LAB — HEPATIC FUNCTION PANEL
ALT: 55 U/L — ABNORMAL HIGH (ref 0–44)
AST: 47 U/L — ABNORMAL HIGH (ref 15–41)
Albumin: 3.9 g/dL (ref 3.5–5.0)
Alkaline Phosphatase: 63 U/L (ref 38–126)
Bilirubin, Direct: 0.1 mg/dL (ref 0.0–0.2)
Indirect Bilirubin: 0.6 mg/dL (ref 0.3–0.9)
Total Bilirubin: 0.7 mg/dL (ref 0.3–1.2)
Total Protein: 7.3 g/dL (ref 6.5–8.1)

## 2018-12-25 MED ORDER — NICOTINE 21 MG/24HR TD PT24: 21.0000 mg | MEDICATED_PATCH | Freq: Every day | TRANSDERMAL | 0 refills | Status: DC

## 2018-12-25 MED ORDER — ADULT MULTIVITAMIN W/MINERALS CH: 1.0000 | ORAL_TABLET | Freq: Every day | ORAL | Status: DC

## 2018-12-25 MED ORDER — SERTRALINE HCL 25 MG PO TABS: 25.0000 mg | ORAL_TABLET | Freq: Every day | ORAL | 0 refills | Status: DC

## 2018-12-25 NOTE — Progress Notes (Addendum)
Pt denies SI/HI. Pt received both written and verbal discharge instructions. Pt verbalized understanding of discharge instructions. Pt agreed to f/u appt and med regimen. Pt received SRA, AVS and a transitional record. Pt gathered belongings from room and locker. Pt safely discharged to the lobby and will transport home via public transportation. Pt was discharged by Sandre Kitty, RN.   [place] NOVEL CORONAVIRUS (COVID-19) DAILY CHECK-OFF SYMPTOMS - answer yes or no to each - every day NO YES  Have you had a fever in the past 24 hours?  . Fever (Temp > 37.80C / 100F) X   Have you had any of these symptoms in the past 24 hours? . New Cough .  Sore Throat  .  Shortness of Breath .  Difficulty Breathing .  Unexplained Body Aches   X   Have you had any one of these symptoms in the past 24 hours not related to allergies?   . Runny Nose .  Nasal Congestion .  Sneezing   X   If you have had runny nose, nasal congestion, sneezing in the past 24 hours, has it worsened?  X   EXPOSURES - check yes or no X   Have you traveled outside the state in the past 14 days?  X   Have you been in contact with someone with a confirmed diagnosis of COVID-19 or PUI in the past 14 days without wearing appropriate PPE?  X   Have you been living in the same home as a person with confirmed diagnosis of COVID-19 or a PUI (household contact)?    X   Have you been diagnosed with COVID-19?    X              What to do next: Answered NO to all: Answered YES to anything:   Proceed with unit schedule Follow the [facility] Inpatient Flowsheet.

## 2018-12-25 NOTE — Discharge Summary (Signed)
Physician Discharge Summary Note  Patient:  Dustin Velazquez is an 58 y.o., male MRN:  [id number] DOB:  [birth year] Patient phone:  [phone] (home)  Patient address:   [address] [place] [id number],  Total Time spent with patient: 15 minutes  Date of Admission:  12/23/2018 Date of Discharge: 12/25/18  Reason for Admission:  Alcohol abuse with suicidal statements  Principal Problem: <principal problem not specified> Discharge Diagnoses: Active Problems:   MDD (major depressive disorder), recurrent episode, severe (HCC)   Past Psychiatric History: History of alcohol use disorder. Admitted [facility] 12/14/18-12/16/18 for alcohol abuse with suicidal ideation- discharged on Celexa but did not take it. Denies other prior hospitalizations or history of suicide attempts.  Past Medical History:  Past Medical History:  Diagnosis Date  . ETOH abuse   . GERD (gastroesophageal reflux disease)   . Hypercholesteremia   . Hypertension   . Sleep walking disorder     Past Surgical History:  Procedure Laterality Date  . APPENDECTOMY    . CORONARY STENT INTERVENTION N/A 12/30/2016   Procedure: Coronary Stent Intervention;  Surgeon: Iran OuchArida, Muhammad A, MD;  Location: [facility] INVASIVE CV LAB;  Service: Cardiovascular;  Laterality: N/A;  . KNEE SURGERY    . LEFT HEART CATH AND CORONARY ANGIOGRAPHY N/A 12/30/2016   Procedure: Left Heart Cath and Coronary Angiography;  Surgeon: Iran OuchArida, Muhammad A, MD;  Location: [facility] INVASIVE CV LAB;  Service: Cardiovascular;  Laterality: N/A;   Family History:  Family History  Problem Relation Age of Onset  . Hypertension Mother   . Hyperlipidemia Father    Family Psychiatric  History: Denies Social History:  Social History   Substance and Sexual Activity  Alcohol Use Yes  . Alcohol/week: 8.0 standard drinks  . Types: 8 Cans of beer per week   Comment: 1/2 pint tequila a day     Social History   Substance and Sexual Activity  Drug Use No     Social History   Socioeconomic History  . Marital status: Married    Spouse name: Not on file  . Number of children: Not on file  . Years of education: Not on file  . Highest education level: Not on file  Occupational History    Employer: [employer]  Social Needs  . Financial resource strain: Not on file  . Food insecurity    Worry: Not on file    Inability: Not on file  . Transportation needs    Medical: Not on file    Non-medical: Not on file  Tobacco Use  . Smoking status: Current Every Day Smoker    Packs/day: 0.25    Years: 30.00    Pack years: 7.50    Types: Cigarettes  . Smokeless tobacco: Never Used  Substance and Sexual Activity  . Alcohol use: Yes    Alcohol/week: 8.0 standard drinks    Types: 8 Cans of beer per week    Comment: 1/2 pint tequila a day  . Drug use: No  . Sexual activity: Not Currently  Lifestyle  . Physical activity    Days per week: Not on file    Minutes per session: Not on file  . Stress: Not on file  Relationships  . Social Musicianconnections    Talks on phone: Not on file    Gets together: Not on file    Attends religious service: Not on file    Active member of club or organization: Not on file  Attends meetings of clubs or organizations: Not on file    Relationship status: Not on file  Other Topics Concern  . Not on file  Social History Narrative  . Not on file    Hospital Course:  From admission H&P: [name] is a 58 year old male with history of alcohol abuse, depression, HTN, GERD, MI with stent, presenting for treatment after reportedly sending a suicidal email to his mother. The patient reports that his father showed up at his doorstep with the police. He was recently admitted to [facility] behavioral health 12/14/18-12/16/18 for alcohol abuse and texting a picture to his ex-wife of himself holding a gun to his head. Patient denies sending any suicidal messages to his mother. He minimizes alcohol use, states "I've not been  drinking much. Just a couple beers every few days." Chart review shows he presented to ED for a fall while drinking the day after discharge from [place] behavioral health. BAL on admission yesterday 290. Denies other drug use. UDS positive for BZDs as well. Patient does report some depression related to separation from his wife but overall minimizes symptoms of depression. Earlier notes indicate patient had reported SI. His wife left him earlier this year related to his alcohol use. He was recently prescribed Zoloft by an outpatient psychiatrist and is requesting to start that here. Denies withdrawal symptoms other than "shakiness." Denies SI/HI/AVH. AST 141. ALT 111.  [name] was admitted under IVC after reportedly sending a suicidal email to his mother. He denied ever sending the email and denied suicidal ideation throughout admission. He minimized depression symptoms and alcohol use throughout admission and insisted he did not need to be in the hospital. He requested discharge throughout hospitalization. Patient was encouraged to pursue rehab due to his recent hospitalization and relapse on alcohol, but patient refused. He initially provided consent for collateral from his brother but then revoked this and refused to provide consent for any other collateral information. [facility]. He was started on Zoloft. He is discharging on the medications listed below. He reported needing to leave early this morning for an appointment with his cardiologist. He has shown stable sleep, appetite, and interaction. He denies any SI/HI/AVH and contracts for safety. He agrees to follow up at [facility] (see below). He is provided with prescriptions for medications upon discharge. He is discharging home via public transportation.  Physical Findings: AIMS: Facial and Oral Movements Muscles of Facial Expression: None, normal Lips and Perioral Area: None, normal Jaw: None,  normal Tongue: None, normal,Extremity Movements Upper (arms, wrists, hands, fingers): None, normal Lower (legs, knees, ankles, toes): None, normal, Trunk Movements Neck, shoulders, hips: None, normal, Overall Severity Severity of abnormal movements (highest score from questions above): None, normal Incapacitation due to abnormal movements: None, normal Patient's awareness of abnormal movements (rate only patient's report): No Awareness, Dental Status Current problems with teeth and/or dentures?: No Does patient usually wear dentures?: No  CIWA:  CIWA-Ar Total: 1 COWS:  COWS Total Score: 7  Musculoskeletal: Strength & Muscle Tone: within normal limits Gait & Station: normal Patient leans: N/A  Psychiatric Specialty Exam: Physical Exam  Nursing note and vitals reviewed. Constitutional: He is oriented to person, place, and time. He appears well-developed and well-nourished.  Cardiovascular: Normal rate.  Respiratory: Effort normal.  Neurological: He is alert and oriented to person, place, and time.    Review of Systems  Constitutional: Negative.   Psychiatric/Behavioral: Positive for depression (stable on  medication) and substance abuse. Negative for hallucinations and suicidal ideas. The patient is not nervous/anxious and does not have insomnia.     Blood pressure (!) 123/97, pulse 97, temperature 98 F (36.7 C), temperature source Oral, resp. rate 18, height 5\' 10"  (1.778 m), weight 85.7 kg, SpO2 100 %.Body mass index is 27.12 kg/m.  See MD's discharge SRA     Have you used any form of tobacco in the last 30 days? (Cigarettes, Smokeless Tobacco, Cigars, and/or Pipes): Yes  Has this patient used any form of tobacco in the last 30 days? (Cigarettes, Smokeless Tobacco, Cigars, and/or Pipes)  No  Blood Alcohol level:  Lab Results  Component Value Date   ETH 290 (H) 12/22/2018   ETH 290 (H) 12/17/2018    Metabolic Disorder Labs:  No results found for: HGBA1C, MPG No results  found for: PROLACTIN Lab Results  Component Value Date   CHOL 202 (H) 12/30/2016   TRIG 127 12/30/2016   HDL 49 12/30/2016   CHOLHDL 4.1 12/30/2016   VLDL 25 12/30/2016   LDLCALC 128 (H) 12/30/2016    See Psychiatric Specialty Exam and Suicide Risk Assessment completed by Attending Physician prior to discharge.  Discharge destination:  Home  Is patient on multiple antipsychotic therapies at discharge:  No   Has Patient had three or more failed trials of antipsychotic monotherapy by history:  No  Recommended Plan for Multiple Antipsychotic Therapies: NA  Discharge Instructions    Discharge instructions   Complete by: As directed    Patient is instructed to take all prescribed medications as recommended. Report any side effects or adverse reactions to your outpatient psychiatrist. Patient is instructed to abstain from alcohol and illegal drugs while on prescription medications. In the event of worsening symptoms, patient is instructed to call the crisis hotline, 911, or go to the nearest emergency department for evaluation and treatment.     Allergies as of 12/25/2018      Reactions   Compazine [prochlorperazine Edisylate] Other (See Comments)   seizure      Medication List    STOP taking these medications   citalopram 20 MG tablet Commonly known as: CELEXA   omeprazole 20 MG capsule Commonly known as: PRILOSEC     TAKE these medications     Indication  aspirin EC 81 MG tablet Take 1 tablet (81 mg total) by mouth daily.  Indication: Acute Heart Attack   lisinopril 40 MG tablet Commonly known as: ZESTRIL Take 1 tablet (40 mg total) by mouth daily.  Indication: Heart Failure   metoprolol tartrate 50 MG tablet Commonly known as: LOPRESSOR Take 1 tablet (50 mg total) by mouth 2 (two) times daily.  Indication: High Blood Pressure Disorder   multivitamin with minerals Tabs tablet Take 1 tablet by mouth daily. Start taking on: December 26, 2018  Indication:  Supplementation   nicotine 21 mg/24hr patch Commonly known as: NICODERM CQ - dosed in mg/24 hours Place 1 patch (21 mg total) onto the skin daily. Start taking on: December 26, 2018  Indication: Nicotine Addiction   pantoprazole 40 MG tablet Commonly known as: PROTONIX Take 1 tablet (40 mg total) by mouth daily.  Indication: Gastroesophageal Reflux Disease   rosuvastatin 40 MG tablet Commonly known as: CRESTOR Take 1 tablet (40 mg total) by mouth daily. What changed: when to take this  Indication: High Amount of Fats in the Blood   sertraline 25 MG tablet Commonly known as: ZOLOFT Take 1 tablet (25 mg total)  by mouth daily. Start taking on: December 26, 2018  Indication: Major Depressive Disorder   ticagrelor 60 MG Tabs tablet Commonly known as: BRILINTA Take 1 tablet (60 mg total) by mouth 2 (two) times daily.  Indication: Acute Coronary Syndrome      Follow-up Information    [facility] Follow up on 12/31/2018.   Why: Telephonic therapy appointment with Manuela Schwartz is Thursday, 6/25 at 11:00a.  Manuela Schwartz will call you on the day of the appointment.  Contact information: [place] [address] [phone]           Follow-up recommendations: Activity as tolerated. Diet as recommended by primary care physician. Keep all scheduled follow-up appointments as recommended.   Comments:   Patient is instructed to take all prescribed medications as recommended. Report any side effects or adverse reactions to your outpatient psychiatrist. Patient is instructed to abstain from alcohol and illegal drugs while on prescription medications. In the event of worsening symptoms, patient is instructed to call the crisis hotline, 911, or go to the nearest emergency department for evaluation and treatment.  Signed: Connye Burkitt, NP 12/25/2018, 8:24 AM

## 2018-12-25 NOTE — [facility] Suicide Risk Assessment (Signed)
[facility] Discharge Suicide Risk Assessment   Principal Problem: <principal problem not specified> Discharge Diagnoses: Active Problems:   MDD (major depressive disorder), recurrent episode, severe ([place])   Total Time spent with patient: 15 minutes  Musculoskeletal: Strength & Muscle Tone: within normal limits Gait & Station: normal Patient leans: N/A  Psychiatric Specialty Exam: Review of Systems  All other systems reviewed and are negative.   Blood pressure (!) 123/97, pulse 97, temperature 98 F (36.7 C), temperature source Oral, resp. rate 18, height 5\' 10"  (1.778 m), weight 85.7 kg, SpO2 100 %.Body mass index is 27.12 kg/m.  General Appearance: Casual  Eye Contact::  Good  Speech:  Normal Rate409  Volume:  Normal  Mood:  Anxious  Affect:  Congruent  Thought Process:  Coherent and Descriptions of Associations: Intact  Orientation:  Full (Time, Place, and Person)  Thought Content:  Logical  Suicidal Thoughts:  No  Homicidal Thoughts:  No  Memory:  Immediate;   Fair Recent;   Fair Remote;   Fair  Judgement:  Intact  Insight:  Fair  Psychomotor Activity:  Normal  Concentration:  Fair  Recall:  Good  Fund of Knowledge:Fair  Language: Good  Akathisia:  Negative  Handed:  Right  AIMS (if indicated):     Assets:  Desire for Improvement Resilience  Sleep:  Number of Hours: 6.75  Cognition: WNL  ADL's:  Intact   Mental Status Per Nursing Assessment::   On Admission:  Suicidal ideation indicated by patient, Suicidal ideation indicated by others, Self-harm behaviors  Demographic Factors:  Male, Caucasian, Low socioeconomic status and Living alone  Loss Factors: Loss of significant relationship  Historical Factors: Impulsivity  Risk Reduction Factors:   NA  Continued Clinical Symptoms:  Alcohol/Substance Abuse/Dependencies  Cognitive Features That Contribute To Risk:  None    Suicide Risk:  Minimal: No identifiable suicidal ideation.  Patients presenting  with no risk factors but with morbid ruminations; may be classified as minimal risk based on the severity of the depressive symptoms  Follow-up Information    [facility] Follow up on 12/31/2018.   Why: Telephonic therapy appointment with Manuela Schwartz is Thursday, 6/25 at 11:00a.  Manuela Schwartz will call you on the day of the appointment.  Contact information: [place] [address] [phone]           Plan Of Care/Follow-up recommendations:  Activity:  ad lib  Sharma Covert, MD 12/25/2018, 8:16 AM

## 2018-12-25 NOTE — Progress Notes (Signed)
  [facility] Adult Case Management Discharge Plan :  Will you be returning to the same living situation after discharge:  Yes,  home At discharge, do you have transportation home?: Yes,  taking the bus. Do you have the ability to pay for your medications: Yes,  BCBS insurance.  Release of information consent forms completed and in the chart. Patient to Follow up at: Follow-up Information    [facility] Follow up on 12/31/2018.   Why: Telephonic therapy appointment with Manuela Schwartz is Thursday, 6/25 at 11:00a.  Manuela Schwartz will call you on the day of the appointment.  Contact information: [place] [address] [phone]           Next level of care provider has access to [place] and Suicide Prevention discussed: Yes,  SPE pamphlet placed on chart for patient to share with supports, declines consents.   Have you used any form of tobacco in the last 30 days? (Cigarettes, Smokeless Tobacco, Cigars, and/or Pipes): Yes  Has patient been referred to the Quitline?: Patient refused referral  Patient has been referred for addiction treatment: Yes  Joellen Jersey, [place] 12/25/2018, 9:14 AM

## 2018-12-25 NOTE — [facility] Suicide Risk Assessment (Signed)
[place] INPATIENT:  Family/Significant Other Suicide Prevention Education  Suicide Prevention Education:  Patient Refusal for Family/Significant Other Suicide Prevention Education: The patient Dustin Velazquez has refused to provide written consent for family/significant other to be provided Family/Significant Other Suicide Prevention Education during admission and/or prior to discharge.  Physician notified.  Joellen Jersey 12/25/2018, 9:14 AM

## 2018-12-25 NOTE — Tx Team (Signed)
Interdisciplinary Treatment and Diagnostic Plan Update  12/25/2018 Time of Session: 9:00am Dustin Velazquez MRN: [id number]  Principal Diagnosis: <principal problem not specified>  Secondary Diagnoses: Active Problems:   MDD (major depressive disorder), recurrent episode, severe (HCC)   Current Medications:  Current Facility-Administered Medications  Medication Dose Route Frequency Provider Last Rate Last Dose  . aspirin EC tablet 81 mg  81 mg Oral Daily Antonieta Pert, MD   81 mg at 12/25/18 3838  . chlordiazePOXIDE (LIBRIUM) capsule 25 mg  25 mg Oral QID PRN Antonieta Pert, MD   25 mg at 12/23/18 1827  . folic acid (FOLVITE) tablet 1 mg  1 mg Oral Daily Antonieta Pert, MD   1 mg at 12/25/18 0803  . hydrOXYzine (ATARAX/VISTARIL) tablet 25 mg  25 mg Oral Q6H PRN Antonieta Pert, MD      . lisinopril (ZESTRIL) tablet 40 mg  40 mg Oral Daily Antonieta Pert, MD   40 mg at 12/25/18 0803  . loperamide (IMODIUM) capsule 2-4 mg  2-4 mg Oral PRN Antonieta Pert, MD      . metoprolol tartrate (LOPRESSOR) tablet 50 mg  50 mg Oral BID Aldean Baker, NP   50 mg at 12/25/18 0803  . multivitamin with minerals tablet 1 tablet  1 tablet Oral Daily Antonieta Pert, MD   1 tablet at 12/25/18 0803  . nicotine (NICODERM CQ - dosed in mg/24 hours) patch 21 mg  21 mg Transdermal Daily Antonieta Pert, MD   21 mg at 12/24/18 0801  . ondansetron (ZOFRAN-ODT) disintegrating tablet 4 mg  4 mg Oral Q6H PRN Antonieta Pert, MD      . pantoprazole (PROTONIX) EC tablet 40 mg  40 mg Oral Daily Antonieta Pert, MD   40 mg at 12/25/18 0803  . rosuvastatin (CRESTOR) tablet 40 mg  40 mg Oral q1800 Antonieta Pert, MD   40 mg at 12/24/18 1718  . sertraline (ZOLOFT) tablet 25 mg  25 mg Oral Daily Antonieta Pert, MD   25 mg at 12/25/18 0803  . thiamine (B-1) injection 100 mg  100 mg Intramuscular Once Antonieta Pert, MD      . thiamine (VITAMIN B-1) tablet 100 mg  100 mg Oral Daily  Antonieta Pert, MD   100 mg at 12/25/18 0803  . ticagrelor (BRILINTA) tablet 60 mg  60 mg Oral BID Antonieta Pert, MD   60 mg at 12/25/18 0803  . traZODone (DESYREL) tablet 50 mg  50 mg Oral QHS PRN Aldean Baker, NP       PTA Medications: Medications Prior to Admission  Medication Sig Dispense Refill Last Dose  . aspirin EC 81 MG tablet Take 1 tablet (81 mg total) by mouth daily. 30 tablet 1   . citalopram (CELEXA) 20 MG tablet Take 1 tablet (20 mg total) by mouth daily. (Patient not taking: Reported on 12/22/2018) 30 tablet 1   . lisinopril (ZESTRIL) 40 MG tablet Take 1 tablet (40 mg total) by mouth daily. 30 tablet 1   . metoprolol tartrate (LOPRESSOR) 50 MG tablet Take 1 tablet (50 mg total) by mouth 2 (two) times daily. 60 tablet 1   . omeprazole (PRILOSEC) 20 MG capsule Take 20 mg by mouth daily.     . pantoprazole (PROTONIX) 40 MG tablet Take 1 tablet (40 mg total) by mouth daily. 30 tablet 1   . rosuvastatin (CRESTOR) 40 MG tablet Take 1  tablet (40 mg total) by mouth daily. (Patient taking differently: Take 40 mg by mouth every evening. ) 30 tablet 1   . ticagrelor (BRILINTA) 60 MG TABS tablet Take 1 tablet (60 mg total) by mouth 2 (two) times daily. 60 tablet 1     Patient Stressors: Marital or family conflict Substance abuse  Patient Strengths: Average or above average intelligence Financial means General fund of knowledge Supportive family/friends  Treatment Modalities: Medication Management, Group therapy, Case management,  1 to 1 session with clinician, Psychoeducation, Recreational therapy.   Physician Treatment Plan for Primary Diagnosis: <principal problem not specified> Long Term Goal(s): Improvement in symptoms so as ready for discharge Improvement in symptoms so as ready for discharge   Short Term Goals: Ability to identify changes in lifestyle to reduce recurrence of condition will improve Ability to verbalize feelings will improve Ability to disclose  and discuss suicidal ideas Ability to demonstrate self-control will improve Ability to identify and develop effective coping behaviors will improve  Medication Management: Evaluate patient's response, side effects, and tolerance of medication regimen.  Therapeutic Interventions: 1 to 1 sessions, Unit Group sessions and Medication administration.  Evaluation of Outcomes: Adequate for Discharge  Physician Treatment Plan for Secondary Diagnosis: Active Problems:   MDD (major depressive disorder), recurrent episode, severe ([place])  Long Term Goal(s): Improvement in symptoms so as ready for discharge Improvement in symptoms so as ready for discharge   Short Term Goals: Ability to identify changes in lifestyle to reduce recurrence of condition will improve Ability to verbalize feelings will improve Ability to disclose and discuss suicidal ideas Ability to demonstrate self-control will improve Ability to identify and develop effective coping behaviors will improve     Medication Management: Evaluate patient's response, side effects, and tolerance of medication regimen.  Therapeutic Interventions: 1 to 1 sessions, Unit Group sessions and Medication administration.  Evaluation of Outcomes: Adequate for Discharge   RN Treatment Plan for Primary Diagnosis: <principal problem not specified> Long Term Goal(s): Knowledge of disease and therapeutic regimen to maintain health will improve  Short Term Goals: Ability to identify and develop effective coping behaviors will improve and Compliance with prescribed medications will improve  Medication Management: RN will administer medications as ordered by provider, will assess and evaluate patient's response and provide education to patient for prescribed medication. RN will report any adverse and/or side effects to prescribing provider.  Therapeutic Interventions: 1 on 1 counseling sessions, Psychoeducation, Medication administration, Evaluate  responses to treatment, Monitor vital signs and CBGs as ordered, Perform/monitor CIWA, COWS, AIMS and Fall Risk screenings as ordered, Perform wound care treatments as ordered.  Evaluation of Outcomes: Adequate for Discharge   LCSW Treatment Plan for Primary Diagnosis: <principal problem not specified> Long Term Goal(s): Safe transition to appropriate next level of care at discharge, Engage patient in therapeutic group addressing interpersonal concerns.  Short Term Goals: Engage patient in aftercare planning with referrals and resources, Increase social support, Identify triggers associated with mental health/substance abuse issues and Increase skills for wellness and recovery  Therapeutic Interventions: Assess for all discharge needs, 1 to 1 time with Social worker, Explore available resources and support systems, Assess for adequacy in community support network, Educate family and significant other(s) on suicide prevention, Complete Psychosocial Assessment, Interpersonal group therapy.  Evaluation of Outcomes: Adequate for Discharge   Progress in Treatment: Attending groups: No. Participating in groups: No. Taking medication as prescribed: Yes. Toleration medication: Yes. Family/Significant other contact made: No, will contact:  SPE information provided to  patient.  Patient understands diagnosis: Yes. Discussing patient identified problems/goals with staff: Yes. Medical problems stabilized or resolved: Yes. Denies suicidal/homicidal ideation: Yes. Issues/concerns per patient self-inventory: No.  New problem(s) identified: Yes, Describe:  limited insight.  New Short Term/Long Term Goal(s): detox, medication management for mood stabilization; elimination of SI thoughts; development of comprehensive mental wellness/sobriety plan.  Patient Goals:  "Discharge."  Discharge Plan or Barriers:  Returning home, following up with outpatient counselor.  Reason for Continuation of  Hospitalization: Anxiety  Estimated Length of Stay: discharge today  Attendees: Patient: Dustin Velazquez 12/25/2018 9:16 AM  Physician:  12/25/2018 9:16 AM  Nursing:  12/25/2018 9:16 AM  RN Care Manager: 12/25/2018 9:16 AM  Social Worker: Enid Cutterharlotte Johnisha Louks, LCSWA 12/25/2018 9:16 AM  Recreational Therapist:  12/25/2018 9:16 AM  Other:  12/25/2018 9:16 AM  Other:  12/25/2018 9:16 AM  Other: 12/25/2018 9:16 AM    Scribe for Treatment Team: Darreld Mcleanharlotte C Aziyah Provencal, LCSWA 12/25/2018 9:16 AM

## 2018-12-31 ENCOUNTER — Telehealth: Payer: Self-pay | Admitting: Internal Medicine

## 2018-12-31 ENCOUNTER — Encounter: Payer: Self-pay | Admitting: Internal Medicine

## 2018-12-31 ENCOUNTER — Ambulatory Visit: Payer: Self-pay | Admitting: Internal Medicine

## 2018-12-31 ENCOUNTER — Other Ambulatory Visit: Payer: Self-pay

## 2018-12-31 VITALS — BP 110/67 | HR 73 | Temp 98.1°F | Resp 16 | Ht 70.0 in | Wt 191.0 lb

## 2018-12-31 DIAGNOSIS — F3289 Other specified depressive episodes: Secondary | ICD-10-CM

## 2018-12-31 DIAGNOSIS — F1010 Alcohol abuse, uncomplicated: Secondary | ICD-10-CM

## 2018-12-31 DIAGNOSIS — I10 Essential (primary) hypertension: Secondary | ICD-10-CM

## 2018-12-31 DIAGNOSIS — F10939 Alcohol use, unspecified with withdrawal, unspecified: Secondary | ICD-10-CM

## 2018-12-31 DIAGNOSIS — F10239 Alcohol dependence with withdrawal, unspecified: Secondary | ICD-10-CM

## 2018-12-31 NOTE — Progress Notes (Signed)
S - Patient with a h/o alcohol use/abuse who went to [facility] for detox and rehab and after entered rehab portion, left abruptly and returned. I saw him upon return (6/4) and referred to [facility] to help and Naltrexone started (not disulfiram as he requested due to concerns for relapse) and not yet allow to RTW, also Covid tested with travel and was negative. He then relapsed and was hospitalized at [facility] with concerns for possible suicidal thoughts and I saw him after he was discharged (6/15) and he noted at that time he had an [facility] appt that afternoon at 4pm.  He said he had no intention of hurting self and was drinking and upset with his separation from his wife. A trial of Zoloft was started - 50mg  daily. Avoiding alcohol was rec'ed and emphaisezed he needed help with this and [facility] was very important to get involved.  He walked in first thing this am wanting to be seen as his boss sent him here as he returned to work this morning a she noted he needs to get back to work as needs money. He noted he has been feeling well and off all alcohol now for 5 days. Denies any shakiness, and states the zoloft is helping. He still noted no thoughts or plans to hurt himself, and notes still feels down at times with his recent separation from his wife. He stated he has an appt with [facility] and when I asked when, he noted not sure of date, and then when pursued further, he noted he would call them as soon as he gets home today to f/u.  He also noted he has a psychiatrist appt next week set up, again could not give me date or with who.  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 Prior written notes from prior interactions were reviewed at length.   No denies any recent CP, SOB, palpitations, tremors (not like had in detox in  [place]).   + tob use Alcohol abuse history - noted not consume any alcohol in last 5 days  Separated from wife in past year  Current Outpatient Medications on File Prior to Visit  Medication Sig Dispense  Refill  . aspirin EC 81 MG tablet Take 1 tablet (81 mg total) by mouth daily. 30 tablet 1  . lisinopril (ZESTRIL) 40 MG tablet Take 1 tablet (40 mg total) by mouth daily. 30 tablet 1  . metoprolol tartrate (LOPRESSOR) 50 MG tablet Take 1 tablet (50 mg total) by mouth 2 (two) times daily. 60 tablet 1  . pantoprazole (PROTONIX) 40 MG tablet Take 1 tablet (40 mg total) by mouth daily. 30 tablet 1  . rosuvastatin (CRESTOR) 40 MG tablet Take 1 tablet (40 mg total) by mouth daily. (Patient taking differently: Take 40 mg by mouth every evening. ) 30 tablet 1  . sertraline (ZOLOFT) 25 MG tablet Take 1 tablet (25 mg total) by mouth daily. 30 tablet 0  . ticagrelor (BRILINTA) 60 MG TABS tablet Take 1 tablet (60 mg total) by mouth 2 (two) times daily. 60 tablet 1   No current facility-administered medications on file prior to visit.     Meds unclear as I prescribed 50mg  daily dose of zoloft, and notes 25mg  above. He was not sure of dose when asked.   Allergies  Allergen Reactions  . Compazine [Prochlorperazine Edisylate] Other (See Comments)    seizure     O - NAD, masked, mildly anxious  BP 110/67 (BP Location: Left Arm, Patient  Position: Sitting, Cuff Size: Large)   Pulse 73   Temp 98.1 F (36.7 C) (Oral)   Resp 16   Ht 5\' 10"  (1.778 m)   Wt 191 lb (86.6 kg)   SpO2 98%   BMI 27.41 kg/m    HEENT - sclera anicteric, masked Neck - carotids without bruits Car - RRR without m/g/r Pulm - CTA Abd - obese, NT Ext - no LE edema Neuro - Affect not flat, approp with conversation, speech was not rapid  Grossly nonfocal  Mild resting tremor in the fingers noted holding arms outstretched, mild intention tremor with F to N testing both right and left, no past pointing, no asterixis. Gait normal.   Last labs from May 2020 reviewed  A/P - 1. Alcohol abuse/WD concerns - s/p attempted detox/rehab in [place] and he left rehab early and relapsed upon return home, again hospitalized and mental health  concerns noted at that time in addition. Notes now alcohol free for 5 days and denies sx's as bad as had with detox in [place].   I noted at length that he needs support to help be successful with alcohol abstinence and the importance of that presently was emphasized. I don't think he has been in contact with [facility] like rec'ed on prior visits and he finally agreed that he needs to call them today as soon as gets home to see them to help. I noted I need to have them involved and support systems in place to some degree and see how he responds a few more days abstaining from alcohol before can allow him to RTW.   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  I asked he bring documentation from his [facility] interactions and dates of appt and if having trouble contacting them, to call us and let us know to try to help.  He did not tolerate the naltrexone (made him ill), so not to return to this med presently  He is to sign a release for [place] today and one with [facility] as well to allow [place] to communicate to help with RTW decision making, as compliance with visits/treatment/management and rec's from them is very important to safely returning and continuing with work  2. Depression - zoloft is helping and to continue. He is not actively suicidal at present  Do feel the 50mg  dose is best and if he has 25 mg tabs at home, can take two daily  Await psychiatry input if he truly has an appt with them next week scheduled and I ask he bring the information on f/u next Monday about this appt as well to confirm  Counseling can be helpful via [facility] as well  3. HTN history - BP slightly up (systolic) last visit,   BP today good and continue meds to manage (he noted he was taking)  4. CAD history - stent placed 2018 and still followed by cardiologist  Will f/u Monday morning and noted like to  have support systems in place as returns to work and often best if was seen by them as well, and await [facility] involvement (at least with direct contact from him and dates/plans for this) and response to continued abstinence from alcohol a few more days before any return to work entertained and he understood.

## 2018-12-31 NOTE — Telephone Encounter (Signed)
He wanted to let you know that he made an appt. with Manuela Schwartz from [place]. Manuela Schwartz will call you sometime next week.

## 2019-01-03 ENCOUNTER — Emergency Department: Payer: BC Managed Care – PPO

## 2019-01-03 ENCOUNTER — Other Ambulatory Visit: Payer: Self-pay

## 2019-01-03 ENCOUNTER — Emergency Department
Admission: EM | Admit: 2019-01-03 | Discharge: 2019-01-03 | Disposition: A | Discharge status: Home / Self Care | Payer: BC Managed Care – PPO | Attending: Emergency Medicine | Admitting: Emergency Medicine

## 2019-01-03 ENCOUNTER — Encounter: Payer: Self-pay | Admitting: Radiology

## 2019-01-03 DIAGNOSIS — R079 Chest pain, unspecified: Secondary | ICD-10-CM | POA: Insufficient documentation

## 2019-01-03 DIAGNOSIS — Z7982 Long term (current) use of aspirin: Secondary | ICD-10-CM | POA: Insufficient documentation

## 2019-01-03 DIAGNOSIS — Z79899 Other long term (current) drug therapy: Secondary | ICD-10-CM | POA: Insufficient documentation

## 2019-01-03 DIAGNOSIS — I252 Old myocardial infarction: Secondary | ICD-10-CM | POA: Insufficient documentation

## 2019-01-03 DIAGNOSIS — F17210 Nicotine dependence, cigarettes, uncomplicated: Secondary | ICD-10-CM | POA: Insufficient documentation

## 2019-01-03 DIAGNOSIS — I10 Essential (primary) hypertension: Secondary | ICD-10-CM | POA: Diagnosis not present

## 2019-01-03 DIAGNOSIS — R0781 Pleurodynia: Secondary | ICD-10-CM | POA: Diagnosis not present

## 2019-01-03 LAB — CBC
HCT: 38.9 % — ABNORMAL LOW (ref 39.0–52.0)
Hemoglobin: 13.6 g/dL (ref 13.0–17.0)
MCH: 32.9 pg (ref 26.0–34.0)
MCHC: 35 g/dL (ref 30.0–36.0)
MCV: 94 fL (ref 80.0–100.0)
Platelets: 332 10*3/uL (ref 150–400)
RBC: 4.14 MIL/uL — ABNORMAL LOW (ref 4.22–5.81)
RDW: 14 % (ref 11.5–15.5)
WBC: 9.8 10*3/uL (ref 4.0–10.5)
nRBC: 0 % (ref 0.0–0.2)

## 2019-01-03 LAB — BASIC METABOLIC PANEL
Anion gap: 13 (ref 5–15)
BUN: 16 mg/dL (ref 6–20)
CO2: 23 mmol/L (ref 22–32)
Calcium: 8.5 mg/dL — ABNORMAL LOW (ref 8.9–10.3)
Chloride: 99 mmol/L (ref 98–111)
Creatinine, Ser: 1.03 mg/dL (ref 0.61–1.24)
GFR calc Af Amer: >60 mL/min (ref >60)
GFR calc non Af Amer: >60 mL/min (ref >60)
Glucose, Bld: 161 mg/dL — ABNORMAL HIGH (ref 70–99)
Potassium: 3.9 mmol/L (ref 3.5–5.1)
Sodium: 135 mmol/L (ref 135–145)

## 2019-01-03 LAB — HEPATIC FUNCTION PANEL
ALT: 79 U/L — ABNORMAL HIGH (ref 0–44)
AST: 90 U/L — ABNORMAL HIGH (ref 15–41)
Albumin: 3.7 g/dL (ref 3.5–5.0)
Alkaline Phosphatase: 74 U/L (ref 38–126)
Bilirubin, Direct: <0.1 mg/dL (ref 0.0–0.2)
Total Bilirubin: 0.7 mg/dL (ref 0.3–1.2)
Total Protein: 6.7 g/dL (ref 6.5–8.1)

## 2019-01-03 LAB — TROPONIN I (HIGH SENSITIVITY)
Troponin I (High Sensitivity): 3 ng/L (ref <18)
Troponin I (High Sensitivity): 4 ng/L (ref <18)

## 2019-01-03 LAB — FIBRIN DERIVATIVES D-DIMER (ARMC ONLY): Fibrin derivatives D-dimer (ARMC): 2971.15 ng/mL (FEU) — ABNORMAL HIGH (ref 0.00–499.00)

## 2019-01-03 LAB — LIPASE, BLOOD: Lipase: 36 U/L (ref 11–51)

## 2019-01-03 MED ORDER — IOHEXOL 350 MG/ML SOLN
75.0000 mL | Freq: Once | INTRAVENOUS | Status: AC | PRN
  Administered 2019-01-03: 75 mL via INTRAVENOUS

## 2019-01-03 NOTE — ED Provider Notes (Signed)
[facility] Emergency Department Provider Note  ____________________________________________  Time seen: Approximately 1:57 PM  I have reviewed the triage vital signs and the nursing notes.   HISTORY  Chief Complaint Chest Pain    HPI Dustin Velazquez is a 58 y.o. male   HPI: A 58 year old patient with a history of peripheral artery disease, hypertension, hypercholesterolemia and obesity presents for evaluation of chest pain. Initial onset of pain was approximately 3-6 hours ago. The patient's chest pain is well-localized, is sharp and is not worse with exertion. The patient's chest pain is middle- or left-sided, is not described as heaviness/pressure/tightness and does not radiate to the arms/jaw/neck. The patient does not complain of nausea and denies diaphoresis. The patient has smoked in the past 90 days and has a family history of coronary artery disease in a first-degree relative with onset less than age 58. The patient has no history of stroke and denies any history of treated diabetes.    A 58 year old patient with a history of peripheral artery disease, hypertension, hypercholesterolemia and obesity presents for evaluation of pleuritic left sided chest pain that started this morning. Initial onset of pain was approximately 3-6 hours ago. The patient's chest pain is well-localized, is sharp and is not worse with exertion. Patient currently rates his pain at 3/10 in intensity. The patient's chest pain is left-sided, is not described as heaviness/pressure/tightness and does not radiate to the arms/jaw/neck. Patient has had some mild nausea and denies diaphoresis. The patient has smoked in the past 90 days and has a family history of coronary artery disease in a first-degree relative with onset less than age 58. The patient has no history of stroke and denies any history of treated diabetes.  No recent rhinorrhea, congestion or nonproductive cough.  Patient has been  afebrile.  No emesis or diarrhea.  Patient denies abdominal pain or shortness of breath.  Past Medical History:  Diagnosis Date  . ETOH abuse   . GERD (gastroesophageal reflux disease)   . Hypercholesteremia   . Hypertension   . Sleep walking disorder     Patient Active Problem List   Diagnosis Date Noted  . MDD (major depressive disorder), recurrent episode, severe (HCC) 12/23/2018  . Severe recurrent major depression without psychotic features (HCC) 12/15/2018  . Suicidal behavior 12/14/2018  . Alcohol abuse 12/12/2018  . Non-STEMI (non-ST elevated myocardial infarction) (HCC) 12/29/2016    Past Surgical History:  Procedure Laterality Date  . APPENDECTOMY    . CORONARY STENT INTERVENTION N/A 12/30/2016   Procedure: Coronary Stent Intervention;  Surgeon: Iran OuchArida, Muhammad A, MD;  Location: [facility] INVASIVE CV LAB;  Service: Cardiovascular;  Laterality: N/A;  . KNEE SURGERY    . LEFT HEART CATH AND CORONARY ANGIOGRAPHY N/A 12/30/2016   Procedure: Left Heart Cath and Coronary Angiography;  Surgeon: Iran OuchArida, Muhammad A, MD;  Location: [facility] INVASIVE CV LAB;  Service: Cardiovascular;  Laterality: N/A;    Prior to Admission medications   Medication Sig Start Date End Date Taking? Authorizing Provider  aspirin EC 81 MG tablet Take 1 tablet (81 mg total) by mouth daily. 12/16/18   Clapacs, Jackquline DenmarkJohn T, MD  lisinopril (ZESTRIL) 40 MG tablet Take 1 tablet (40 mg total) by mouth daily. 12/16/18   Clapacs, Jackquline DenmarkJohn T, MD  metoprolol tartrate (LOPRESSOR) 50 MG tablet Take 1 tablet (50 mg total) by mouth 2 (two) times daily. 12/16/18   Clapacs, Jackquline DenmarkJohn T, MD  pantoprazole (PROTONIX) 40 MG tablet Take 1 tablet (40 mg total)  by mouth daily. 12/17/18   Clapacs, Jackquline DenmarkJohn T, MD  rosuvastatin (CRESTOR) 40 MG tablet Take 1 tablet (40 mg total) by mouth daily. Patient taking differently: Take 40 mg by mouth every evening.  12/16/18 03/16/19  Clapacs, Jackquline DenmarkJohn T, MD  sertraline (ZOLOFT) 25 MG tablet Take 1 tablet (25 mg total) by mouth  daily. 12/26/18   Aldean BakerSykes, Janet E, NP  ticagrelor (BRILINTA) 60 MG TABS tablet Take 1 tablet (60 mg total) by mouth 2 (two) times daily. 12/16/18   Clapacs, Jackquline DenmarkJohn T, MD    Allergies Compazine [prochlorperazine edisylate]  Family History  Problem Relation Age of Onset  . Hypertension Mother   . Hyperlipidemia Father     Social History Social History   Tobacco Use  . Smoking status: Current Every Day Smoker    Packs/day: 0.25    Years: 30.00    Pack years: 7.50    Types: Cigarettes  . Smokeless tobacco: Never Used  Substance Use Topics  . Alcohol use: Yes    Alcohol/week: 8.0 standard drinks    Types: 8 Cans of beer per week    Comment: 1/2 pint tequila a day, pt states that he is trying to quit   . Drug use: No     Review of Systems  Constitutional: No fever/chills Eyes: No visual changes. No discharge ENT: No upper respiratory complaints. Cardiovascular: Patient has chest pain.  Respiratory: no cough. No SOB. Gastrointestinal: No abdominal pain.  No nausea, no vomiting.  No diarrhea.  No constipation. Genitourinary: Negative for dysuria. No hematuria Musculoskeletal: Negative for musculoskeletal pain. Skin: Negative for rash, abrasions, lacerations, ecchymosis. Neurological: Negative for headaches, focal weakness or numbness.   ____________________________________________   PHYSICAL EXAM:  VITAL SIGNS: ED Triage Vitals  Enc Vitals Group     BP 01/03/19 1110 109/76     Pulse Rate 01/03/19 1109 87     Resp 01/03/19 1109 18     Temp 01/03/19 1109 99 F (37.2 C)     Temp Source 01/03/19 1109 Oral     SpO2 01/03/19 1109 97 %     Weight 01/03/19 1110 185 lb (83.9 kg)     Height 01/03/19 1110 5\' 10"  (1.778 m)     Head Circumference --      Peak Flow --      Pain Score 01/03/19 1109 10     Pain Loc --      Pain Edu? --      Excl. in GC? --      Constitutional: Alert and oriented. Well appearing and in no acute distress. Eyes: Conjunctivae are normal. PERRL.  EOMI. Head: Atraumatic. Cardiovascular: Normal rate, regular rhythm. Normal S1 and S2.  Good peripheral circulation. Respiratory: Normal respiratory effort without tachypnea or retractions. Lungs CTAB. Good air entry to the bases with no decreased or absent breath sounds. Gastrointestinal: Bowel sounds 4 quadrants. Soft and nontender to palpation. No guarding or rigidity. No palpable masses. No distention. No CVA tenderness. Musculoskeletal: Full range of motion to all extremities. No gross deformities appreciated. Neurologic:  Normal speech and language. No gross focal neurologic deficits are appreciated.  Skin:  Skin is warm, dry and intact. No rash noted. Psychiatric: Mood and affect are normal. Speech and behavior are normal. Patient exhibits appropriate insight and judgement.   ____________________________________________   LABS (all labs ordered are listed, but only abnormal results are displayed)  Labs Reviewed  BASIC METABOLIC PANEL - Abnormal; Notable for the following components:  Result Value   Glucose, Bld 161 (*)    Calcium 8.5 (*)    All other components within normal limits  CBC - Abnormal; Notable for the following components:   RBC 4.14 (*)    HCT 38.9 (*)    All other components within normal limits  HEPATIC FUNCTION PANEL - Abnormal; Notable for the following components:   AST 90 (*)    ALT 79 (*)    All other components within normal limits  FIBRIN DERIVATIVES D-DIMER ([facility] ONLY) - Abnormal; Notable for the following components:   Fibrin derivatives D-dimer [facility]) 0,240.97 (*)    All other components within normal limits  TROPONIN I (HIGH SENSITIVITY)  TROPONIN I (HIGH SENSITIVITY)  LIPASE, BLOOD   ____________________________________________  EKG  Normal sinus rhythm without ST segment elevation.  QRS is narrow.  No peaked or flattened T waves. ____________________________________________  RADIOLOGY I personally viewed and evaluated these images  as part of my medical decision making, as well as reviewing the written report by the radiologist.  Dg Chest 2 View  Result Date: 01/03/2019 CLINICAL DATA:  58 year old male with a history of chest pain EXAM: CHEST - 2 VIEW COMPARISON:  12/29/2016 FINDINGS: Cardiomediastinal silhouette unchanged in size and contour. No evidence of central vascular congestion. No interlobular septal thickening. Coarsened interstitial markings. Double density over the lower mediastinum is unchanged from prior. Stigmata of emphysema, with increased retrosternal airspace, flattened hemidiaphragms, increased AP diameter, and hyperinflation on the AP view. No displaced fracture.  Degenerative changes of the spine. IMPRESSION: Chronic lung changes and emphysema without evidence of acute cardiopulmonary disease. Hiatal hernia Electronically Signed   By: Corrie Mckusick D.O.   On: 01/03/2019 12:37   Ct Angio Chest Pe W And/or Wo Contrast  Result Date: 01/03/2019 CLINICAL DATA:  Left-sided chest pain, diaphoresis EXAM: CT ANGIOGRAPHY CHEST WITH CONTRAST TECHNIQUE: Multidetector CT imaging of the chest was performed using the standard protocol during bolus administration of intravenous contrast. Multiplanar CT image reconstructions and MIPs were obtained to evaluate the vascular anatomy. CONTRAST:  15mL OMNIPAQUE IOHEXOL 350 MG/ML SOLN COMPARISON:  None. FINDINGS: Cardiovascular: Satisfactory opacification of the pulmonary arteries to the segmental level. No evidence of pulmonary embolism. Normal heart size. Three-vessel coronary artery calcifications. No pericardial effusion. Mediastinum/Nodes: No enlarged mediastinal, hilar, or axillary lymph nodes. Small hiatal hernia. Thyroid gland, trachea, and esophagus demonstrate no significant findings. Lungs/Pleura: Diffuse bilateral bronchial wall thickening. Nonspecific 3 mm pulmonary nodule of the right lower lobe (series 6, image 62). No pleural effusion or pneumothorax. Upper Abdomen: No  acute abnormality.  Hepatic steatosis. Musculoskeletal: No chest wall abnormality. No acute or significant osseous findings. Review of the MIP images confirms the above findings. IMPRESSION: 1.  Negative examination for pulmonary embolism. 2. Diffuse bilateral bronchial wall thickening, consistent with nonspecific infectious or inflammatory bronchitis. 3. Nonspecific 3 mm pulmonary nodule of the right lower lobe (series 6, image 62). CT follow-up is optional for this nodule at 12 months if there are high risk for lung cancer. 4.  Coronary artery disease. Electronically Signed   By: Eddie Candle M.D.   On: 01/03/2019 16:12    ____________________________________________    PROCEDURES  Procedure(s) performed:    Procedures    Medications  iohexol (OMNIPAQUE) 350 MG/ML injection 75 mL (75 mLs Intravenous Contrast Given 01/03/19 1557)     ____________________________________________   INITIAL IMPRESSION / ASSESSMENT AND PLAN / ED COURSE  Pertinent labs & imaging results that were available during my care of  the patient were reviewed by me and considered in my medical decision making (see chart for details).  Review of the [place] CSRS was performed in accordance of the [facility] prior to dispensing any controlled drugs.  Clinical Course as of Jan 02 1714  Sun Jan 03, 2019  1506 WBC: 9.8 [JW]    Clinical Course User Index [JW] Pia Mau M, [place]    HEAR Score: 3     Assessment and plan: Chest pain 58 year old male presents to the emergency department with acute onset of pleuritic left-sided chest pain that started this morning.  See HPI for more details.  On physical exam, patient is supine and appears to be resting comfortably.  His pain has improved since presenting to the emergency department.  Vital signs are reassuring without tachycardia or tachypnea.  Patient denied shortness of breath during physical exam.  Patient's left-sided chest pain was nonreproducible with  palpation.  Differential diagnosis included anxiety, STEMI, and STEMI, pancreatitis, PE...  EKG revealed normal sinus rhythm without ST segment elevation.  Both sets of troponin were within reference range.  Patient's d-dimer was significantly elevated and patient underwent CTA which revealed no evidence of thromboembolism.  Lipase was within reference range.  Patient states that his pain had completely resolved and is requesting discharge.  Strict return precautions were given to return to the emergency department for new or worsening symptoms.  Patient states that he has easy access to the emergency department and can return should he experience worsening symptoms.  All patient questions were answered.      ____________________________________________  FINAL CLINICAL IMPRESSION(S) / ED DIAGNOSES  Final diagnoses:  Chest pain, unspecified type      NEW MEDICATIONS STARTED DURING THIS VISIT:  ED Discharge Orders    None          This chart was dictated using voice recognition software/Dragon. Despite best efforts to proofread, errors can occur which can change the meaning. Any change was purely unintentional.    Orvil Feil, PA-C 01/03/19 1716    Sharyn Creamer, MD 01/05/19 [phone]

## 2019-01-03 NOTE — ED Triage Notes (Signed)
PT arrives with complaints of left sided chest pain that pt reports woke him from his sleep. Pt also reports diaphoresis and feeling like "something is not right."

## 2019-01-03 NOTE — ED Provider Notes (Signed)
-----------------------------------------   2:09 PM on 01/03/2019 -----------------------------------------  Patient reports pain and symptoms are completely gone.  He has no noted risk factor for pulmonary embolism.  No leg swelling.  No recent surgeries.  No history of blood clots.  He does smoke.  Send d-dimer.  Initial troponin within normal limits, will send second high-sensitivity troponin now.  Following cardiac heart pathway, if his second troponin is negative and his atypical symptoms he would be appropriate for discharge from ACS standpoint  EKG reviewed interpreted by me at 1115 Heart rate 70 QRS 90 QTc 420 Normal sinus rhythm, normal EKG   Medical screening examination/treatment/procedure(s) were conducted as a shared visit with non-physician practitioner(s) and myself.  I personally evaluated the patient during the encounter.     Delman Kitten, MD 01/03/19 1410

## 2019-01-04 ENCOUNTER — Other Ambulatory Visit: Payer: Self-pay

## 2019-01-04 ENCOUNTER — Ambulatory Visit: Payer: Self-pay | Admitting: Internal Medicine

## 2019-01-04 ENCOUNTER — Encounter: Payer: Self-pay | Admitting: Internal Medicine

## 2019-01-04 VITALS — BP 148/88 | HR 81 | Temp 98.0°F | Resp 16 | Ht 70.0 in | Wt 187.0 lb

## 2019-01-04 DIAGNOSIS — F10930 Alcohol use, unspecified with withdrawal, uncomplicated: Secondary | ICD-10-CM

## 2019-01-04 DIAGNOSIS — F10230 Alcohol dependence with withdrawal, uncomplicated: Secondary | ICD-10-CM

## 2019-01-04 DIAGNOSIS — R0789 Other chest pain: Secondary | ICD-10-CM

## 2019-01-04 DIAGNOSIS — F1010 Alcohol abuse, uncomplicated: Secondary | ICD-10-CM

## 2019-01-04 DIAGNOSIS — F3289 Other specified depressive episodes: Secondary | ICD-10-CM

## 2019-01-04 NOTE — Progress Notes (Signed)
S - Patient presents for f/u after saw at end of last week. Then he noted he was alcohol free for 5 days. I noted the importance to have support systems in place as returns to work and often best if was seen by them as well, and awaited [facility] involvement before returning to work. Also his response to continued abstinence from alcohol a few more days before any return to work entertained and he understood. Of note, he was seen in the ER yesterday for CP and PE ruled out, no ischemic concerns, and he was discharged after pain resolved.  To recall, he has a h/o alcohol use/abuse and went to [facility] for detox and rehab and after entered rehab portion, left abruptly and returned. I saw him upon return (6/4) and referred to [facility] to help and Naltrexone started (not disulfiram as he requested due to concerns for relapse) and not yet allow to RTW, also Covid tested with travel and was negative. He then relapsed and was hospitalized at [facility] with concerns for possible suicidal thoughts and I saw him after he was discharged (6/15) and he noted at that time he had an [facility] appt that afternoon at 4pm.  He said he had no intention of hurting self and was drinking and upset with his separation from his wife. A trial of Zoloft was started - 50mg  daily. Avoiding alcohol was rec'ed and emphasized he needed help with this and [facility] was very important to get involved.  He then returned to work at the end of last week, as he noted he needed to get back to work as needed money, and was sent here for clearance to return. He noted he had been feeling well and off all alcohol for 5 days. Noted the zoloft was helping. He still noted no thoughts or plans to hurt himself, and noted still felt down at times with his recent separation from his wife. He stated he had an appt with [facility] and when I asked when, he noted not sure of date, and then when pursued further, he noted he would call them.He also noted he had a psychiatrist appt next week  set up, again could not give me date or with who.  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 Prior written notes from prior interactions were reviewed at length again today.   Today, he notes has not had a drink since our last visit, not all weekend. He notes he still feels that left chest discomfort that went to ER with, beneath his left breast but is better. Hurts to push there, no increase with breathing. No cough increase. No radiation of pain, no N/V. W/u in ER reviewed. He noted he came here dressed for work when I asked if he felt he could return to work, and again he noted he really needs to get back to work.  No denies any increase in tremors (and when I noted mild on exam, he noted thinks because he has not had anything to eat this morning). Notes the zoloft is still helping and no increase in feeling down or depressed symptoms.   He noted he called [facility] and talked to Manuela Schwartz and showed me on his phone a contact they had rec'ed for him to call to be seen and he noted he was to call [facility] to set up an appt with them this week. No appointment set-up for this week as of yet. No appt scheduled with any psychiatrist as he noted prior either, with  [facility] helping him with this as well he stated.   + tob use Alcohol abuse history - noted not consume any alcohol since our last visit  Separated from wife in past year        Current Outpatient Medications on File Prior to Visit  Medication Sig Dispense Refill  . aspirin EC 81 MG tablet Take 1 tablet (81 mg total) by mouth daily. 30 tablet 1  . lisinopril (ZESTRIL) 40 MG tablet Take 1 tablet (40 mg total) by mouth daily. 30 tablet 1  . metoprolol tartrate (LOPRESSOR) 50 MG tablet Take 1 tablet (50 mg total) by mouth 2 (two) times daily. 60 tablet 1  . pantoprazole (PROTONIX) 40 MG tablet Take 1 tablet (40 mg total) by mouth daily. 30 tablet 1  . rosuvastatin (CRESTOR) 40 MG tablet Take 1 tablet (40 mg total) by mouth daily. (Patient taking differently:  Take 40 mg by mouth every evening. ) 30 tablet 1  . sertraline (ZOLOFT) 25 MG tablet Take 1 tablet (25 mg total) by mouth daily. 30 tablet 0  . ticagrelor (BRILINTA) 60 MG TABS tablet Take 1 tablet (60 mg total) by mouth 2 (two) times daily. 60 tablet 1   No current facility-administered medications on file prior to visit.     Dose of Zoloft med still unclear as I again asked about zoloft dose and he did not check if was 25 mg or 50mg  daily dose.       Allergies  Allergen Reactions  . Compazine [Prochlorperazine Edisylate] Other (See Comments)    seizure     O - NAD, masked,  BP 110/67 (BP Location: Left Arm, Patient Position: Sitting, Cuff Size: Large)   Pulse 73   Temp 98.1 F (36.7 C) (Oral)   Resp 16   Ht 5\' 10"  (1.778 m)   Wt 191 lb (86.6 kg)   SpO2 98%   BMI 27.41 kg/m    HEENT - sclera anicteric, masked Neck - carotids without bruits Car - RRR without m/g/r Chest - with palpation over lest chest wall, beneath the left breast, was mildly tender (able to reproduce the pain feeling with palpation), mild gynecomastia Pulm - CTA Abd - obese, NT, no guarding or rebound,  Ext - no LE edema Neuro - Affect not flat, approp with conversation, speech was not rapid             Grossly nonfocal             Mild resting tremor persists holding arms outstretched, mild intention tremor persists with F to N testing both right and left, no past pointing, no asterixis. Could not balance one foot, right or left. Tandem walk was very difficult and lost balance easily if not focused on looking down (unable to do looking up), Romberg negative.   Last labs from ER reviewed, also had ECG, CXR and CT angio.   A/P - 1. Alcohol abuse/WD concerns - s/p attempted detox/rehab in [place] and he left rehab early and relapsed upon return home, again hospitalized and mental health concerns noted at that time in addition. Notes continues to refrain from alcohol, but without support systems in place,  noted my concerns with any continued success with abstinence.             I again noted that he needs support to help be successful with alcohol abstinence and the importance of that was emphasized. [facility] was contacted by him, but no concrete visits/plans in place as  of yet. I noted they need to be involved and support systems in place to help.             I do agree with his point that returning to work will be a helpful part of his treatment and management at present, with safely returning the key             He finally signed a release for [place] today and he will need to sign one with [facility] as well to allow [place] to communicate to help with decision making in the future, as compliance with visits/treatment/management and rec's from them is very important to safely returning and continuing with work.   He will take today and call [facility] and the other contact he shared with me on his phone to f/u with and let us know the results (asked he call our office with any appointments he will have set-up).   Tentatively ok'ed RTW tomorrow, transitional duties with no driving vehicles/equipment and activities as tolerated in the short term and assess. He was adamant with him never drinking before or at work in his past, and I emphasized the importance of that.   2. Depression - zoloft is helping and to continue. He remains not actively suicidal at present             Do feel the 50mg  dose is best and if he has 25 mg tabs at home, can take two daily was noted last visit and yet he did not check the dose. He will do that.             Counseling can be helpful via [facility] as well and await their input   3. Recent CP and known CAD history - stent placed 2018 and still followed by cardiologist  ER w/u reviewed and not felt an ischemic cardiac source, no PE. Question a chest wall source with it being reproducible on exam today. Has improved. Will continue to monitor at present.   Will f/u next Monday at the latest, sooner  prn and will see how he does with easing into work activities with transitional duties and noted the importance of having support systems in place as returns to work and await continued [facility] involvement and response to continued attempted abstinence from alcohol.

## 2019-01-05 ENCOUNTER — Telehealth: Payer: Self-pay | Admitting: Internal Medicine

## 2019-01-05 NOTE — Telephone Encounter (Signed)
°  1. Do you have a fever? no 2. Are you having chills? yes 3. Do you have a sore throat? no 4. Are you experiencing resp S/Sx -cough, SOB? yes 5. Do you have muscle aches? yes 6. Are you experiencing N/V/D? yes 7. Experiencing loss of sense of taste and/or smell? no 8. Do you have a headache? No  9. Have you had contact with a person confirmed Positive for Covid-19? no 10. Have you traveled outside of [place]? Yes, [place]   Symptoms started couple of weeks ago. He said he didn't really think anything of it till this morning when he started feeling worse.

## 2019-01-05 NOTE — Telephone Encounter (Signed)
Spoke with [employer].  Not at work today.  Gave him [facility] phone number [phone] & advised him to call them for guidance on testing.

## 2019-01-05 NOTE — Telephone Encounter (Signed)
Dustin Velazquez called this morning & talked with Jess & was requesting COVID testing because he "feels worse this morning".  Called him to try to clarify when symptoms started because he was in the office yesterday & answered no to all the symptoms when he was screened before seeing the doctor.  States I just thought ya'll could send me to the hospital to get tested - rambling about SOB from smoking & stairs.  Couldn't be specific when I asked about the start of symptoms.  When I told him that he answered no to symptoms yesterday & today says he may have symptoms, he put the entire office at risk.  Then he says don't worry about it.  I'll just go get tested on my own.  Don't get the doctor involved.

## 2019-01-05 NOTE — Telephone Encounter (Signed)
Please give Jeani Hawking the phone number for the Covid-19 hotline to call this morning and they will help with the potential need and recommendations for testing.

## 2019-01-07 NOTE — Telephone Encounter (Signed)
[facility] called & said Dustin Velazquez signed a Medical Release form with them & said that you can contact them.  His counselor is [name].  He has been met with her via telephone calls on 12/21/2018 & 12/31/2018.  His next appointment is 01/18/2019 at 4:00pm.  The phone number for [facility] is [phone].

## 2019-01-07 NOTE — Telephone Encounter (Signed)
[facility] called & said Jeani Hawking signed a Medical Release form with them & said that you can contact them.  His counselor is [name].  He has been met with her via telephone calls on 12/21/2018 & 12/31/2018.  His next appointment is 01/18/2019 at 4:00pm.  The phone number for [facility] is [phone].

## 2019-01-07 NOTE — Telephone Encounter (Signed)
Message reviewed. I called [facility] at 4:15 today to try to talk to Dustin Velazquez, as patient has a f/u appt with me on Monday and I wanted to try to get some input from Manuela Schwartz about any concerns with RTW issues. He was ok'ed to return last Tuesday, but still restricted any driving of vehicles/equipment and transitioning into the work environment was planned. I left a VM and requested a return call, Monday before 10 am if possible.

## 2019-01-09 ENCOUNTER — Encounter: Payer: Self-pay | Admitting: Emergency Medicine

## 2019-01-09 ENCOUNTER — Emergency Department
Admission: EM | Admit: 2019-01-09 | Discharge: 2019-01-10 | Disposition: A | Discharge status: Home / Self Care | Payer: 59 | Attending: Emergency Medicine | Admitting: Emergency Medicine

## 2019-01-09 ENCOUNTER — Other Ambulatory Visit: Payer: Self-pay

## 2019-01-09 DIAGNOSIS — F10230 Alcohol dependence with withdrawal, uncomplicated: Secondary | ICD-10-CM | POA: Diagnosis not present

## 2019-01-09 DIAGNOSIS — F17210 Nicotine dependence, cigarettes, uncomplicated: Secondary | ICD-10-CM | POA: Insufficient documentation

## 2019-01-09 DIAGNOSIS — Z7982 Long term (current) use of aspirin: Secondary | ICD-10-CM | POA: Insufficient documentation

## 2019-01-09 DIAGNOSIS — R69 Illness, unspecified: Secondary | ICD-10-CM | POA: Diagnosis not present

## 2019-01-09 DIAGNOSIS — Y908 Blood alcohol level of 240 mg/100 ml or more: Secondary | ICD-10-CM | POA: Insufficient documentation

## 2019-01-09 DIAGNOSIS — Z79899 Other long term (current) drug therapy: Secondary | ICD-10-CM | POA: Diagnosis not present

## 2019-01-09 DIAGNOSIS — I10 Essential (primary) hypertension: Secondary | ICD-10-CM | POA: Insufficient documentation

## 2019-01-09 DIAGNOSIS — F329 Major depressive disorder, single episode, unspecified: Secondary | ICD-10-CM | POA: Insufficient documentation

## 2019-01-09 DIAGNOSIS — F10920 Alcohol use, unspecified with intoxication, uncomplicated: Secondary | ICD-10-CM

## 2019-01-09 DIAGNOSIS — I252 Old myocardial infarction: Secondary | ICD-10-CM | POA: Diagnosis not present

## 2019-01-09 DIAGNOSIS — F10129 Alcohol abuse with intoxication, unspecified: Secondary | ICD-10-CM | POA: Diagnosis not present

## 2019-01-09 LAB — CBC
HCT: 40.3 % (ref 39.0–52.0)
Hemoglobin: 14.1 g/dL (ref 13.0–17.0)
MCH: 32.5 pg (ref 26.0–34.0)
MCHC: 35 g/dL (ref 30.0–36.0)
MCV: 92.9 fL (ref 80.0–100.0)
Platelets: 206 10*3/uL (ref 150–400)
RBC: 4.34 MIL/uL (ref 4.22–5.81)
RDW: 13.7 % (ref 11.5–15.5)
WBC: 7.5 10*3/uL (ref 4.0–10.5)
nRBC: 0 % (ref 0.0–0.2)

## 2019-01-09 LAB — COMPREHENSIVE METABOLIC PANEL
ALT: 106 U/L — ABNORMAL HIGH (ref 0–44)
AST: 135 U/L — ABNORMAL HIGH (ref 15–41)
Albumin: 4.2 g/dL (ref 3.5–5.0)
Alkaline Phosphatase: 84 U/L (ref 38–126)
Anion gap: 15 (ref 5–15)
BUN: 9 mg/dL (ref 6–20)
CO2: 21 mmol/L — ABNORMAL LOW (ref 22–32)
Calcium: 8.7 mg/dL — ABNORMAL LOW (ref 8.9–10.3)
Chloride: 95 mmol/L — ABNORMAL LOW (ref 98–111)
Creatinine, Ser: 0.75 mg/dL (ref 0.61–1.24)
GFR calc Af Amer: >60 mL/min (ref >60)
GFR calc non Af Amer: >60 mL/min (ref >60)
Glucose, Bld: 99 mg/dL (ref 70–99)
Potassium: 3.5 mmol/L (ref 3.5–5.1)
Sodium: 131 mmol/L — ABNORMAL LOW (ref 135–145)
Total Bilirubin: 0.6 mg/dL (ref 0.3–1.2)
Total Protein: 7.8 g/dL (ref 6.5–8.1)

## 2019-01-09 LAB — ETHANOL: Alcohol, Ethyl (B): 328 mg/dL (ref <10)

## 2019-01-09 MED ORDER — VITAMIN B-1 100 MG PO TABS
100.0000 mg | ORAL_TABLET | Freq: Every day | ORAL | Status: DC
  Administered 2019-01-10: 100 mg via ORAL
  Filled 2019-01-09: qty 1

## 2019-01-09 MED ORDER — LORAZEPAM 2 MG/ML IJ SOLN: 0.0000 mg | Freq: Four times a day (QID) | INTRAMUSCULAR | Status: DC

## 2019-01-09 MED ORDER — LORAZEPAM 2 MG PO TABS: 0.0000 mg | ORAL_TABLET | Freq: Two times a day (BID) | ORAL | Status: DC

## 2019-01-09 MED ORDER — LORAZEPAM 2 MG PO TABS
0.0000 mg | ORAL_TABLET | Freq: Four times a day (QID) | ORAL | Status: DC
  Administered 2019-01-10 (×2): 1 mg via ORAL
  Filled 2019-01-09 (×2): qty 1

## 2019-01-09 MED ORDER — THIAMINE HCL 100 MG/ML IJ SOLN: 100.0000 mg | Freq: Every day | INTRAMUSCULAR | Status: DC

## 2019-01-09 MED ORDER — LORAZEPAM 2 MG/ML IJ SOLN: 0.0000 mg | Freq: Two times a day (BID) | INTRAMUSCULAR | Status: DC

## 2019-01-09 NOTE — ED Provider Notes (Signed)
[facility] Emergency Department Provider Note  ____________________________________________   I have reviewed the triage vital signs and the nursing notes.   HISTORY  Chief Complaint Alcohol Intoxication   History limited by: Not Limited   HPI Dustin Velazquez is a 58 y.o. male who presents to the emergency department today because of concern for alcohol abuse and desire for detox. The patient states that he did drink roughly 6 beers today. He says that for the past few days he has also been feeling unwell. He has a hard time with explaining exactly how he feels unwell. States that when he wakes up he just feels poorly. He denies any fevers. Denies any chest pain and shortness. The patient does state that he would like to detox.   Records reviewed. Per medical record review patient has a history of HLD, HTN. ED visits for alcohol intoxication.  Past Medical History:  Diagnosis Date  . ETOH abuse   . GERD (gastroesophageal reflux disease)   . Hypercholesteremia   . Hypertension   . Sleep walking disorder     Patient Active Problem List   Diagnosis Date Noted  . MDD (major depressive disorder), recurrent episode, severe (HCC) 12/23/2018  . Severe recurrent major depression without psychotic features (HCC) 12/15/2018  . Suicidal behavior 12/14/2018  . Alcohol abuse 12/12/2018  . Non-STEMI (non-ST elevated myocardial infarction) (HCC) 12/29/2016    Past Surgical History:  Procedure Laterality Date  . APPENDECTOMY    . CORONARY STENT INTERVENTION N/A 12/30/2016   Procedure: Coronary Stent Intervention;  Surgeon: Iran Ouch, MD;  Location: [facility] INVASIVE CV LAB;  Service: Cardiovascular;  Laterality: N/A;  . KNEE SURGERY    . LEFT HEART CATH AND CORONARY ANGIOGRAPHY N/A 12/30/2016   Procedure: Left Heart Cath and Coronary Angiography;  Surgeon: Iran Ouch, MD;  Location: [facility] INVASIVE CV LAB;  Service: Cardiovascular;  Laterality: N/A;     Prior to Admission medications   Medication Sig Start Date End Date Taking? Authorizing Provider  aspirin EC 81 MG tablet Take 1 tablet (81 mg total) by mouth daily. 12/16/18   Clapacs, Jackquline Denmark, MD  lisinopril (ZESTRIL) 40 MG tablet Take 1 tablet (40 mg total) by mouth daily. 12/16/18   Clapacs, Jackquline Denmark, MD  metoprolol tartrate (LOPRESSOR) 50 MG tablet Take 1 tablet (50 mg total) by mouth 2 (two) times daily. 12/16/18   Clapacs, Jackquline Denmark, MD  pantoprazole (PROTONIX) 40 MG tablet Take 1 tablet (40 mg total) by mouth daily. 12/17/18   Clapacs, Jackquline Denmark, MD  rosuvastatin (CRESTOR) 40 MG tablet Take 1 tablet (40 mg total) by mouth daily. Patient taking differently: Take 40 mg by mouth every evening.  12/16/18 03/16/19  Clapacs, Jackquline Denmark, MD  sertraline (ZOLOFT) 25 MG tablet Take 1 tablet (25 mg total) by mouth daily. 12/26/18   Aldean Baker, NP  ticagrelor (BRILINTA) 60 MG TABS tablet Take 1 tablet (60 mg total) by mouth 2 (two) times daily. 12/16/18   Clapacs, Jackquline Denmark, MD    Allergies Compazine [prochlorperazine edisylate]  Family History  Problem Relation Age of Onset  . Hypertension Mother   . Hyperlipidemia Father     Social History Social History   Tobacco Use  . Smoking status: Current Every Day Smoker    Packs/day: 0.25    Years: 30.00    Pack years: 7.50    Types: Cigarettes  . Smokeless tobacco: Never Used  Substance Use Topics  . Alcohol use:  Yes    Alcohol/week: 8.0 standard drinks    Types: 8 Cans of beer per week    Comment: 1/2 pint tequila a day, pt states that he is trying to quit   . Drug use: No    Review of Systems Constitutional: Feeling unwell.  Eyes: No visual changes. ENT: No sore throat. Cardiovascular: Denies chest pain. Respiratory: Denies shortness of breath. Gastrointestinal: No abdominal pain.  No nausea, no vomiting.  No diarrhea.   Genitourinary: Negative for dysuria. Musculoskeletal: Negative for back pain. Skin: Negative for rash. Neurological:  Negative for headaches, focal weakness or numbness.  ____________________________________________   PHYSICAL EXAM:  VITAL SIGNS: ED Triage Vitals  Enc Vitals Group     BP 01/09/19 1851 118/89     Pulse Rate 01/09/19 1851 (!) 110     Resp 01/09/19 1851 20     Temp 01/09/19 1851 99 F (37.2 C)     Temp Source 01/09/19 1851 Oral     SpO2 01/09/19 1851 94 %     Weight 01/09/19 1853 185 lb (83.9 kg)     Height 01/09/19 1853 5\' 10"  (1.778 m)     Head Circumference --      Peak Flow --      Pain Score 01/09/19 1853 0   Constitutional: Alert and oriented.  Eyes: Conjunctivae are normal.  ENT      Head: Normocephalic and atraumatic.      Nose: No congestion/rhinnorhea.      Mouth/Throat: Mucous membranes are moist.      Neck: No stridor. Hematological/Lymphatic/Immunilogical: No cervical lymphadenopathy. Cardiovascular: Normal rate, regular rhythm.  No murmurs, rubs, or gallops.  Respiratory: Normal respiratory effort without tachypnea nor retractions. Breath sounds are clear and equal bilaterally. No wheezes/rales/rhonchi. Gastrointestinal: Soft and non tender. No rebound. No guarding.  Genitourinary: Deferred Musculoskeletal: Normal range of motion in all extremities. No lower extremity edema. Neurologic:  Normal speech and language. No gross focal neurologic deficits are appreciated.  Skin:  Skin is warm, dry and intact. No rash noted.  ____________________________________________    LABS (pertinent positives/negatives)  Ethanol 328 CBC wbc 7.5, hgb 14.1, plt 206 CMP na 131, k 3.5, cr 0.75, ast 135, alt 106  ____________________________________________   EKG  None  ____________________________________________    RADIOLOGY  None  ____________________________________________   PROCEDURES  Procedures  ____________________________________________   INITIAL IMPRESSION / ASSESSMENT AND PLAN / ED COURSE  Pertinent labs & imaging results that were available  during my care of the patient were reviewed by me and considered in my medical decision making (see chart for details).   Patient presented to the emergency department today stating that he feels unwell and desiring detox.  Patient has a history of alcohol abuse.  Patient's alcohol level is significantly elevated here.  Will have TTS evaluate.  ____________________________________________   FINAL CLINICAL IMPRESSION(S) / ED DIAGNOSES  Final diagnoses:  Alcoholic intoxication without complication ([place])     Note: This dictation was prepared with Dragon dictation. Any transcriptional errors that result from this process are unintentional     Nance Pear, MD 01/09/19 2308

## 2019-01-09 NOTE — ED Triage Notes (Signed)
States has history of ETOH abuse. States drinks beer and tequila. Arrives with his dad and states he wants to quit drinking. Tremor noted R arm. States last drink was today. Denies SI.

## 2019-01-09 NOTE — ED Notes (Signed)
Pt. States "I am depressed, I separated from my wife about 5 months ago and things have been going downhill".  Pt. States "I loved my wife she was my best friend.  Pt. States he went to a detox facility in [facility], pt. States detox went well but rehab facility did not and had to find way home to [facility].  Pt. States, "I have not eaten in 3 days.  Pt. Given meal tray, apple juice and water.  Pt. Tearful during interview.

## 2019-01-09 NOTE — ED Notes (Signed)
Pt. Changed out in room #22.  Pt. Has pair of boxers, grey shorts, grey shirt, brown flip flops and black ball cap.  All items in one bag.

## 2019-01-10 MED ORDER — SERTRALINE HCL 50 MG PO TABS
50.0000 mg | ORAL_TABLET | Freq: Every day | ORAL | Status: DC
  Administered 2019-01-10: 50 mg via ORAL
  Filled 2019-01-10: qty 1

## 2019-01-10 MED ORDER — LISINOPRIL 10 MG PO TABS
40.0000 mg | ORAL_TABLET | Freq: Every day | ORAL | Status: DC
  Administered 2019-01-10: 40 mg via ORAL
  Filled 2019-01-10: qty 4

## 2019-01-10 MED ORDER — PANTOPRAZOLE SODIUM 40 MG PO TBEC
40.0000 mg | DELAYED_RELEASE_TABLET | Freq: Every day | ORAL | Status: DC
  Administered 2019-01-10: 40 mg via ORAL
  Filled 2019-01-10: qty 1

## 2019-01-10 MED ORDER — TICAGRELOR 60 MG PO TABS
60.0000 mg | ORAL_TABLET | Freq: Two times a day (BID) | ORAL | Status: DC
  Filled 2019-01-10 (×2): qty 1

## 2019-01-10 MED ORDER — CHLORDIAZEPOXIDE HCL 25 MG PO CAPS: 25.0000 mg | ORAL_CAPSULE | Freq: Three times a day (TID) | ORAL | 0 refills | Status: DC | PRN

## 2019-01-10 MED ORDER — CHLORDIAZEPOXIDE HCL 25 MG PO CAPS
25.0000 mg | ORAL_CAPSULE | Freq: Once | ORAL | Status: AC
  Administered 2019-01-10: 25 mg via ORAL
  Filled 2019-01-10: qty 1

## 2019-01-10 MED ORDER — ROSUVASTATIN CALCIUM 20 MG PO TABS
40.0000 mg | ORAL_TABLET | Freq: Every evening | ORAL | Status: DC
  Filled 2019-01-10: qty 2

## 2019-01-10 MED ORDER — METOPROLOL TARTRATE 50 MG PO TABS
50.0000 mg | ORAL_TABLET | Freq: Two times a day (BID) | ORAL | Status: DC
  Administered 2019-01-10: 50 mg via ORAL
  Filled 2019-01-10: qty 1

## 2019-01-10 MED ORDER — LORAZEPAM 1 MG PO TABS
1.0000 mg | ORAL_TABLET | Freq: Once | ORAL | Status: AC
  Administered 2019-01-10: 1 mg via ORAL
  Filled 2019-01-10: qty 1

## 2019-01-10 MED ORDER — ASPIRIN EC 81 MG PO TBEC
81.0000 mg | DELAYED_RELEASE_TABLET | Freq: Every day | ORAL | Status: DC
  Administered 2019-01-10: 81 mg via ORAL
  Filled 2019-01-10: qty 1

## 2019-01-10 NOTE — ED Notes (Signed)
Per MD order, pt was discharged to lobby where his brother awaited outside. Pt received all belongings and was in no acute distress when escorted out by this Probation officer. One-time Librium 25 mg dose given prior to discharge, per Dr. Jacqualine Code. Discharge instructions were reviewed with pt and pt verbalized understanding of called-in script, follow-up appt, and need to call [facility].

## 2019-01-10 NOTE — ED Notes (Signed)
Pt ambulated to restroom without difficulty. Pt given sprite by Edison Nasuti, EDT

## 2019-01-10 NOTE — ED Notes (Signed)
Pt ambulated to restroom without difficulty

## 2019-01-10 NOTE — ED Notes (Signed)
Introduced self to pt, who denies SI, HI, and AVH. He says he feels dehydrated. MD was notified by other staff. Pt is tolerating PO fluids so this was encouraged. Pt voiced understanding.

## 2019-01-10 NOTE — ED Notes (Signed)
Pt given meal tray.

## 2019-01-10 NOTE — BH Assessment (Signed)
Information faxed to [facility];  . [facility] [phone] or [phone]), unable to reach anyone  . [facility] [phone]), patient denied.  [facility] [phone])- Patient did not meet criteria for their dual diagnosis program.  . [place] (Ms. [phone])  Writer updated patient about not locating a detox bed. Patient stated he understood. He stated he would follow up with is current outpatient provider, with [facility]. He also requested to get fluids. Writer updated patient's nurse Levada Dy) and ER MD (Dr. Jacqualine Code).

## 2019-01-10 NOTE — ED Provider Notes (Signed)
Vitals:   01/10/19 0450 01/10/19 1022  BP: (!) 159/103 (!) 156/106  Pulse: (!) 123 (!) 106  Resp:  18  Temp:    SpO2:  96%    Patient resting.  Unfortunately could not be placed in a detox directly today, but patient reports he is going to plan to go to away cyst on Monday.  Discussed with the patient, I will give him a dose of Librium here and a short course of Librium for withdrawal.  He is not driving understands not to drive and discussed return precautions.  Patient alert oriented no distress, minimal tremors in hands at this time without signs or symptoms of delirium or severe withdrawal  Return precautions and treatment recommendations and follow-up discussed with the patient who is agreeable with the plan.    Delman Kitten, MD 01/10/19 [phone]

## 2019-01-10 NOTE — ED Provider Notes (Signed)
-----------------------------------------   5:56 AM on 01/10/2019 -----------------------------------------   Blood pressure (!) 159/103, pulse (!) 123, temperature 98.2 F (36.8 C), temperature source Oral, resp. rate 20, height 1.778 m (5\' 10" ), weight 83.9 kg, SpO2 97 %.  The patient is sleeping at this time.  There have been no acute events since the last update.  Patient is awaiting TTS assessment for possible ETOH detox placement, but he is voluntary, and if he decides to leave he is free to do so.   Hinda Kehr, MD 01/10/19 [phone]

## 2019-01-10 NOTE — BH Assessment (Signed)
Assessment Note  Dustin Velazquez is an 58 y.o. male who presents to the ER seeking assistance for his alcohol use and depression. Patient reports he has drank on a daily basis for an unknown amount of time. On yesterday (01/09/2019) he had six beers and a half a pint of tequila. The longest he's been sober is seven days. His current symptoms of withdrawal are; shakes, nausea and vomiting.  Patient reports of having depression due to the recent separation from his wife. He admits to drinking alcohol to cope with the separation and depression. His symptoms of depression are; crying spells, isolating, lack of motivation do his daily activities and responsibilities.  During the interview, the patient was calm, cooperative and pleasant. He was able to provide appropriate answers to the questions. He denies SI/HI and AV/H.  Diagnosis: Depression & Alcohol Use Disorder  Past Medical History:  Past Medical History:  Diagnosis Date  . ETOH abuse   . GERD (gastroesophageal reflux disease)   . Hypercholesteremia   . Hypertension   . Sleep walking disorder     Past Surgical History:  Procedure Laterality Date  . APPENDECTOMY    . CORONARY STENT INTERVENTION N/A 12/30/2016   Procedure: Coronary Stent Intervention;  Surgeon: Iran Ouch, MD;  Location: [facility] INVASIVE CV LAB;  Service: Cardiovascular;  Laterality: N/A;  . KNEE SURGERY    . LEFT HEART CATH AND CORONARY ANGIOGRAPHY N/A 12/30/2016   Procedure: Left Heart Cath and Coronary Angiography;  Surgeon: Iran Ouch, MD;  Location: [facility] INVASIVE CV LAB;  Service: Cardiovascular;  Laterality: N/A;    Family History:  Family History  Problem Relation Age of Onset  . Hypertension Mother   . Hyperlipidemia Father     Social History:  reports that he has been smoking cigarettes. He has a 7.50 pack-year smoking history. He has never used smokeless tobacco. He reports current alcohol use of about 8.0 standard drinks of alcohol per week.  He reports that he does not use drugs.  Additional Social History:  Alcohol / Drug Use Pain Medications: See PTA Prescriptions: See PTA Over the Counter: See PTA History of alcohol / drug use?: Yes Longest period of sobriety (when/how long): Seven days Negative Consequences of Use: Personal relationships Substance #1 Name of Substance 1: Alcohol 1 - Amount (size/oz): 6 beers & half a pint of Tequila 1 - Frequency: Daily 1 - Duration: Unable to quantify 1 - Last Use / Amount: 01/09/2019  CIWA: CIWA-Ar BP: (!) 159/103 Pulse Rate: (!) 123 Nausea and Vomiting: 3 Tactile Disturbances: none Tremor: two Auditory Disturbances: not present Paroxysmal Sweats: barely perceptible sweating, palms moist Visual Disturbances: not present Anxiety: mildly anxious Headache, Fullness in Head: mild Agitation: normal activity Orientation and Clouding of Sensorium: oriented and can do serial additions CIWA-Ar Total: 9 COWS:    Allergies:  Allergies  Allergen Reactions  . Compazine [Prochlorperazine Edisylate] Other (See Comments)    seizure    Home Medications: (Not in a hospital admission)   OB/GYN Status:  No LMP for male patient.  General Assessment Data [facility] of Assessment[facility] ED TTS Assessment: In system Is this a Tele or Face-to-Face Assessment?: Face-to-Face Is this an Initial Assessment or a Re-assessment for this encounter?: Initial Assessment Patient Accompanied by:: N/A Language Other than English: No Living Arrangements: Other (Comment)(Private Home) What gender do you identify as?: Male Marital status: Separated Pregnancy Status: No Living Arrangements: Alone Can pt return to current living arrangement?: Yes Admission Status: Voluntary  Is patient capable of signing voluntary admission?: Yes Referral Source: Self/Family/Friend Insurance type: BCBS  Medical Screening Exam [facility]) Medical Exam completed: Yes  Crisis Care Plan Living Arrangements:  Alone Legal Guardian: Other:(Self) Name of Psychiatrist: Reports of none Name of Therapist: [facility]  Education Status Is patient currently in school?: No Highest grade of school patient has completed: High School Diploma Is the patient employed, unemployed or receiving disability?: Employed  Risk to self with the past 6 months Suicidal Ideation: No Has patient been a risk to self within the past 6 months prior to admission? : No Suicidal Intent: No Has patient had any suicidal intent within the past 6 months prior to admission? : No Is patient at risk for suicide?: No Suicidal Plan?: No Has patient had any suicidal plan within the past 6 months prior to admission? : No Access to Means: No Specify Access to Suicidal Means: Reports of none What has been your use of drugs/alcohol within the last 12 months?: Alcohol Previous Attempts/Gestures: No How many times?: 0 Other Self Harm Risks: Reports of none Triggers for Past Attempts: None known Intentional Self Injurious Behavior: None Family Suicide History: No Recent stressful life event(s): Divorce, Other (Comment) Persecutory voices/beliefs?: No Depression: Yes Depression Symptoms: Tearfulness, Guilt, Isolating, Feeling worthless/self pity, Loss of interest in usual pleasures Substance abuse history and/or treatment for substance abuse?: Yes Suicide prevention information given to non-admitted patients: Not applicable  Risk to Others within the past 6 months Homicidal Ideation: No Does patient have any lifetime risk of violence toward others beyond the six months prior to admission? : No Thoughts of Harm to Others: No Current Homicidal Intent: No Current Homicidal Plan: No Access to Homicidal Means: No Identified Victim: Reports of none History of harm to others?: No Assessment of Violence: None Noted Violent Behavior Description: Reports of none Does patient have access to weapons?: No Criminal Charges  Pending?: No Does patient have a court date: No Is patient on probation?: No  Psychosis Hallucinations: None noted Delusions: None noted  Mental Status Report Appearance/Hygiene: Unremarkable, In scrubs Eye Contact: Fair Motor Activity: Freedom of movement, Unremarkable Speech: Logical/coherent, Unremarkable Level of Consciousness: Drowsy Mood: Depressed, Anxious, Sad, Pleasant Affect: Appropriate to circumstance, Depressed, Sad, Anxious Anxiety Level: Minimal Thought Processes: Coherent, Relevant Judgement: Unimpaired Orientation: Person, Place, Time, Situation, Appropriate for developmental age Obsessive Compulsive Thoughts/Behaviors: Minimal  Cognitive Functioning Concentration: Normal Memory: Recent Intact, Remote Intact Is patient IDD: No Insight: Fair Impulse Control: Fair Appetite: Fair Have you had any weight changes? : Loss Sleep: No Change Total Hours of Sleep: 7 Vegetative Symptoms: None  ADLScreening [facility]) Patient's cognitive ability adequate to safely complete daily activities?: Yes Patient able to express need for assistance with ADLs?: Yes Independently performs ADLs?: Yes (appropriate for developmental age)  Prior Inpatient Therapy Prior Inpatient Therapy: Yes Prior Therapy Dates: 12/2018 [facility] [place] Reason for Treatment: Alcohol Detox and Substance Abuse Treatment  Prior Outpatient Therapy Prior Outpatient Therapy: Yes Prior Therapy Dates: Current Prior Therapy [facility]ilty[facility]Provider([facility] Reason for Treatment: Substance Abuse Disorder Does patient have an ACCT team?: No Does patient have Intensive In-House Services?  : No Does patient have [facility] services? : No Does patient have P4CC services?: No  ADL Screening (condition at time of admission) Patient's cognitive ability adequate to safely complete daily activities?: Yes Is  the patient deaf or have difficulty hearing?: No Does the  patient have difficulty seeing, even when wearing glasses/contacts?: No Does the patient have difficulty concentrating, remembering, or making decisions?: No Patient able to express need for assistance with ADLs?: Yes Does the patient have difficulty dressing or bathing?: No Independently performs ADLs?: Yes (appropriate for developmental age) Does the patient have difficulty walking or climbing stairs?: No Weakness of Legs: None Weakness of Arms/Hands: None  Home Assistive Devices/Equipment Home Assistive Devices/Equipment: None  Therapy Consults (therapy consults require a physician order) PT Evaluation Needed: No OT Evalulation Needed: No SLP Evaluation Needed: No Abuse/Neglect Assessment (Assessment to be complete while patient is alone) Abuse/Neglect Assessment Can Be Completed: Yes Physical Abuse: Denies Verbal Abuse: Denies Sexual Abuse: Denies Exploitation of patient/patient's resources: Denies Self-Neglect: Denies Values / Beliefs Cultural Requests During Hospitalization: None Spiritual Requests During Hospitalization: None Consults Spiritual Care Consult Needed: No Social Work Consult Needed: No Regulatory affairs officer (For Healthcare) Does Patient Have a Medical Advance Directive?: No       Child/Adolescent Assessment Running Away Risk: Denies(Patient is an adult)  Disposition:  Disposition Initial Assessment Completed for this Encounter: Yes  On Site Evaluation by:   Reviewed with Physician:    Gunnar Fusi MS, LCAS, [facility], [place], [place] Therapeutic Triage Specialist 01/10/2019 10:00 AM

## 2019-01-10 NOTE — ED Notes (Signed)
Pt is awake. Ambulated to the restroom without assistance or difficulty.

## 2019-01-11 ENCOUNTER — Telehealth: Payer: Self-pay

## 2019-01-11 ENCOUNTER — Other Ambulatory Visit: Payer: Self-pay

## 2019-01-11 ENCOUNTER — Encounter: Payer: Self-pay | Admitting: Internal Medicine

## 2019-01-11 ENCOUNTER — Ambulatory Visit: Payer: Self-pay | Admitting: Internal Medicine

## 2019-01-11 VITALS — BP 148/97 | HR 147 | Temp 99.0°F | Resp 16 | Ht 69.0 in | Wt 183.0 lb

## 2019-01-11 DIAGNOSIS — F10930 Alcohol use, unspecified with withdrawal, uncomplicated: Secondary | ICD-10-CM

## 2019-01-11 DIAGNOSIS — F3289 Other specified depressive episodes: Secondary | ICD-10-CM

## 2019-01-11 DIAGNOSIS — F10230 Alcohol dependence with withdrawal, uncomplicated: Secondary | ICD-10-CM

## 2019-01-11 DIAGNOSIS — I10 Essential (primary) hypertension: Secondary | ICD-10-CM

## 2019-01-11 DIAGNOSIS — F411 Generalized anxiety disorder: Secondary | ICD-10-CM

## 2019-01-11 DIAGNOSIS — F10239 Alcohol dependence with withdrawal, unspecified: Secondary | ICD-10-CM

## 2019-01-11 NOTE — Telephone Encounter (Signed)
Would like to speak with you about pt.

## 2019-01-11 NOTE — Telephone Encounter (Signed)
Called Manuela Schwartz and talked with her (from [place]) about [facility] Halt. She has interacted with the patient recently and most recently rec'ed a more intensive outpatient program that she feels is needed or even an inpatient program for alcohol abuse ([facility], [place], or [facility] and she has given him numbers to contact and she noted pat was going to call [facility].  She noted he insisted he does not drink at work and never has, and she tended to believe him with this.  She noted that the outpatient programs are helpful in monitoring any use and would be very helpful at present. She agreed that sitting at home would likely be more problematic for the pt than returning to work. She also rec'ed AA as well.  I will be seeing him shortly on a f/u visit and her input was helpful.

## 2019-01-11 NOTE — Progress Notes (Signed)
S - Patient presents for F/u after I saw last week with alcohol abuse concerns. I called Manuela Schwartz late last week and she returned my call this morning and talked with her (from [facility]) about the pt. I noted to him of our conversation and that we were both very concerned for his safety at present, trying to stop all alcohol without more intensive support. She has interacted with the patient recently (had phone interactions 6/15 and 6/25) and most recently rec'ed a more intensive outpatient program that she feels is needed or even an inpatient program for alcohol abuse ([facility], [place], or [facility] and she has given him numbers to contact and she noted hewas going to call [facility]).  She noted he insisted he does not drink at work and never has, and she tended to believe him with this.  She noted that the outpatient programs are helpful in monitoring any use and would be very helpful at present. She agreed that sitting at home would likely be more problematic for the pt than returning to work. She also rec'ed AA as well.   He never contacted any of the ones she rec'ed and he noted he has an appt with a therapist on Friday at 4 pm at [facility] in [place], and he talks to a good friend daily who has been sober for 14 years. He has not contacted AA.  He went to the ER Sat as he noted he slipped up again, not tell me how many drinks he had when I asked a couple times, and his level was over 300. He was given Librium there and 3 doses on discharge, and not placed in detox (he noted the place in [place] was over $300 a night and insurance not cover and not want to go to inpatient detox again like did in [place]). 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 He again noted he doesn't drink in the mornings, never before work, or at work, and has been trying to stay hydrated with water and gatorade. He had called the office last  week and noted not feel well the morning after he was seen and recommended he talk with the health department as may be rec'ed for Covid testing. 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    He ran out of his metoprolol and not taken in past 2 days and is heading to the pharmacy to pick up this morning. He is dressed to go to work. 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 He noted he is having tremors, and again noted being very anxious as to why.  He denies any LE swelling, any belly pains, no N/V. No fevers or Covid symptoms of concern when asked. He adamantly denied any thoughts of hurting self or others and confirmed his Zoloft is 50mg , takes one daily and thinks is helpful  To review, he has a h/oalcohol use/abuse and went to [facility] for detox and rehab and after entered rehab portion, left abruptly and returned. I saw him upon return (  6/4) and referred to [facility] to help and Naltrexone started (not disulfiram as he requested due to concerns for relapse) and not yet allowed to RTW then. He then relapsed and was hospitalized at [facility] with concerns for possible suicidal thoughts and I saw him after he was discharged (6/15) and he noted at that time he had an [facility] appt that afternoon at 4pm. He said he had no intention of hurting self and was drinking and upset with his separation from his wife. A trial of Zoloft was started - 50mg  daily. Avoiding alcohol was rec'ed and emphasized he needed help with this and [facility] was very important to get involved.  He then returned to work at the end of last week, as he noted he needed to get back to work as needed money, and was sent here for clearance to return. He noted he had been  feeling well and off all alcohol for 5 days at that time. He then went to the ER on 6/28 with CP, was discharged with a negative work-up for his CP and had an appt with me on 6/29 wanting to return to work. That visit note was reviewed and he was given a note for transitional duty to return to work, and emphasized the importance of getting help as an outpatient with his alcohol dependence and abuse history.  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 Prior written notes from prior interactions were reviewed at length again today.   +tob use Alcoholabuse history - another relapse this Sat, July 4th holiday Separated with his wife and has a court date on Wed and stressed with this as well.  Allergies  Allergen Reactions  . Compazine [Prochlorperazine Edisylate] Other (See Comments)    seizure   Current Outpatient Medications on File Prior to Visit  Medication Sig Dispense Refill  . aspirin EC 81 MG tablet Take 1 tablet (81 mg total) by mouth daily. 30 tablet 1  . lisinopril (ZESTRIL) 40 MG tablet Take 1 tablet (40 mg total) by mouth daily. 30 tablet 1  . metoprolol tartrate (LOPRESSOR) 50 MG tablet Take 1 tablet (50 mg total) by mouth 2 (two) times daily. 60 tablet 1  . naltrexone (DEPADE) 50 MG tablet Take 50 mg by mouth daily.     . pantoprazole (PROTONIX) 40 MG tablet Take 1 tablet (40 mg total) by mouth daily. 30 tablet 1  . rosuvastatin (CRESTOR) 40 MG tablet Take 1 tablet (40 mg total) by mouth daily. (Patient taking differently: Take 40 mg by mouth every evening. ) 30 tablet 1  . sertraline (ZOLOFT) 50 MG tablet Take 50 mg by mouth daily.     . ticagrelor (BRILINTA) 60 MG TABS tablet Take 1 tablet (60 mg total) by mouth 2 (two) times daily. 60 tablet 1   No current facility-administered medications on file prior to visit.   Meds reviewed - not taking naltrexone  O - NAD, masked, anxious appearing  BP 110/67 (BP Location: Left Arm, Patient Position: Sitting, Cuff Size: Large)   Pulse 73  Temp 98.1 F (36.7 C) (Oral)  Resp 16  Ht 5\' 10"  (1.778 m)  Wt 191 lb (86.6 kg)  SpO2 98%  BMI 27.41 kg/m   rechck VS - BP 150/102 and pulse 140 on my exam and regular, RR normal at rest  HEENT - sclera anicteric, masked Car - tachy and reg, without obvious m Pulm - CTA Abd - obese, NT, no guarding or rebound,  Ext - no  LE edema Neuro - Affect not flat, approp with conversation, speech was not rapid Grossly nonfocal + resting tremor, more evident holding arms outstretched, milder intention tremor persists with F to N testing both right and left (worse on left), no past pointing, no asterixis. Could not balance one foot, right or left (he noted his balance is bad). Tandem walk was very difficult and lost balance easily if not focused on looking down (unable to do looking up), Romberg negative.   Last note/labs from ER reviewed,    A/P -1. Alcohol abuse/WD concerns - s/p attempted detox/rehab in [place] and he left rehab early and relapsed upon return home and again this past weekend. Has had [facility] center involved without more intensive support systems in place at present, and I noted my concerns with any continued success with abstinence without these in place. I again noted that he needs support to help be successful with alcohol abstinence and the importance of that was emphasized. He has not contacted any of the three places [facility] rec'ed to help, nor AA. The therapist appt on Friday is unclear if specific for alcohol dependence issues, and felt not likely. I do agree with his point that returning to work will be a helpful part of his treatment and management at present, with safely returning the key              He agreed to get [facility] involved and will contact them today to get in with them ASAP to help with an intensive outpatient program. I discussed doing an inpatient detox program and I thought needed to be  entertained with his clinical presentation today and recent ER visit this past weekend, but he was adamant about not doing an inpatient program.              Will not increase his return to work duties and he will remain out (he noted if continued on transitional duty, would not be working). I noted that I need to have an intensive outpatient program in place and him enrolled and participating to help ensure a safe return to work and Darl Pikes from [place] suggested this as well as part of the return to work process.  2. Depression/anxiety - zoloft is helping and to continue. He remains not actively suicidal at present Do feel the 50mg  dose is best and continue. Counseling also needed to continue and helpful via [facility] and possibly the therapist arranged in [place] on Friday. Has a f/u [facility] appt 7/13.  3. Tachy and increased BP today, HTN and CAD history noted - concerned noted with this and the metoprolol stopping cold [place] as he ran out 2 days a concern contributing as well as the recent relapse with alcohol and alcohol withdrawal concerns and the added stress/anxiety increase also  He is to pick up the metoprolol med this morning as planned and get in two doses today to resume.  Cont the zoloft  Emphasized staying well hydrated with Gatorade and water products as doing  If has any return of CP's, any SOB, passing out feelings, needs to go to the ER to be seen immediately  Will f/u Thursday, sooner prn. Await contact he makes with [facility] and if having any problems doing so, [facility]  Or [place] can be contacted to try to help as well and the importance of this was communicated to him today.

## 2019-01-14 ENCOUNTER — Other Ambulatory Visit: Payer: Self-pay

## 2019-01-14 ENCOUNTER — Encounter: Payer: Self-pay | Admitting: Internal Medicine

## 2019-01-14 ENCOUNTER — Ambulatory Visit: Payer: Self-pay | Admitting: Internal Medicine

## 2019-01-14 VITALS — BP 152/86 | HR 79 | Temp 98.5°F | Resp 16 | Ht 69.0 in | Wt 181.0 lb

## 2019-01-14 DIAGNOSIS — F10230 Alcohol dependence with withdrawal, uncomplicated: Secondary | ICD-10-CM

## 2019-01-14 DIAGNOSIS — F10239 Alcohol dependence with withdrawal, unspecified: Secondary | ICD-10-CM | POA: Insufficient documentation

## 2019-01-14 DIAGNOSIS — F1010 Alcohol abuse, uncomplicated: Secondary | ICD-10-CM

## 2019-01-14 DIAGNOSIS — F10930 Alcohol use, unspecified with withdrawal, uncomplicated: Secondary | ICD-10-CM

## 2019-01-14 DIAGNOSIS — F10939 Alcohol use, unspecified with withdrawal, unspecified: Secondary | ICD-10-CM | POA: Insufficient documentation

## 2019-01-14 DIAGNOSIS — I10 Essential (primary) hypertension: Secondary | ICD-10-CM

## 2019-01-14 DIAGNOSIS — F411 Generalized anxiety disorder: Secondary | ICD-10-CM

## 2019-01-14 DIAGNOSIS — F3289 Other specified depressive episodes: Secondary | ICD-10-CM

## 2019-01-14 NOTE — Progress Notes (Signed)
S - Patient presents for F/u after I saw Monday.   Following for alcohol abuse concerns. Prior notes reviewed with details. Since Monday, he has refrained from any alcohol use and notes he is feeling well, walking at the track daily, and committed to staying sober and really needs to get back to work noted. No CP, SOB, palpitations, abdominal pains, leg swelling, and the shakiness has greatly improved.  He has an appt with [facility] on Monday at 0800 or 0830 and also with Darl Pikes from [place] on Monday as well for f/u.  He remains on the same meds as noted Monday and did get the metoprolol refilled and back on that and he is not missing doses.   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   +tob use Alcoholabuse history - another relapse this past Sat, July 4th holiday  Separated with his wife and had a court date last Wed and was stressed with that as well.  Allergies  Allergen Reactions  . Compazine [Prochlorperazine Edisylate] Other (See Comments)    seizure   Current Outpatient Medications on File Prior to Visit  Medication Sig Dispense Refill  . aspirin EC 81 MG tablet Take 1 tablet (81 mg total) by mouth daily. 30 tablet 1  . lisinopril (ZESTRIL) 40 MG tablet Take 1 tablet (40 mg total) by mouth daily. 30 tablet 1  . metoprolol tartrate (LOPRESSOR) 50 MG tablet Take 1 tablet (50 mg total) by mouth 2 (two) times daily. 60 tablet 1  . naltrexone (DEPADE) 50 MG tablet Take 50 mg by mouth daily.     . pantoprazole (PROTONIX) 40 MG tablet Take 1 tablet (40 mg total) by mouth daily. 30 tablet 1  . rosuvastatin (CRESTOR) 40 MG tablet Take 1 tablet (40 mg total) by mouth daily. (Patient taking differently: Take 40 mg by mouth every evening. ) 30 tablet 1  . sertraline (ZOLOFT) 50 MG tablet Take 50 mg by mouth daily.     . ticagrelor (BRILINTA) 60 MG TABS tablet Take 1 tablet (60 mg total) by mouth 2 (two)  times daily. 60 tablet 1   No current facility-administered medications on file prior to visit.    Meds reviewed - not taking naltrexone  O - NAD, masked, much less anxious appearing, appears much more relaxed   BP (!) 152/86 (BP Location: Right Arm, Patient Position: Sitting, Cuff Size: Large)   Pulse 79   Temp 98.5 F (36.9 C) (Oral)   Resp 16   Ht 5\' 9"  (1.753 m)   Wt 181 lb (82.1 kg)   SpO2 97%   BMI 26.73 kg/m   HEENT - sclera anicteric, masked Car - RRR without m/g/r, not tachy,  Pulm - CTA Abd - obese, NT, no guarding or rebound, Ext - no LE edema Neuro - Affect not flat, approp with conversation, speech was not rapid Grossly nonfocal No resting tremor, withholding arms outstretched, mild tremor (much less than last visit Mon), milder intention tremor persistswith F to N testing both right and left (still slightly worse on left), no past pointing, no asterixis.Could not balance one foot, right or left like prior (he noted his balance is bad), but was more stable attempting to today. Romberg negative.  A/P -1. Alcohol abuse/WD concerns - s/p [place] detox[place] and he left rehab early and relapsed upon return home and again this past  weekend. Has had [facility] involved without more intensive support systems in place prior visit, and now has contacted [facility] and has an appt scheduled for Monday (a more intensive outpatient program for alcohol dependence).Iagaintoday noted that he needs support to help be successful with alcohol abstinence.  I do agree with his point that returning to work will be a helpful part of his treatment and management at present, with safely returning the key  The importance of remaining without any alcohol consumption at present was emphasized and he noted he would.   Will not increase his return to work duties presently. Await getting through the weekend and  after goes to appt Monday with [facility] and rec'ed he bring some documentation from that visit to confirm and sign a release with them all\owing them to communicate with me about his compliance with the program and he noted he would. Understands of the importance to help ensure a safe return to work and Manuela Schwartz from [place] will stay involved and f/u with her as planned also needed.   Will F/u Monday to re-assess after his appointment with [facility] and can further discuss RTW then, with possibility of returning early next week pending the f/u visit.   2. Depression/anxiety - zoloft is helping and to continue. Heremainsnot actively suicidal at present Counseling also to continue and helpful via [facility]  3. Increased BP and tachy last visit on Monday,  HTN and CAD history noted - BP better today and not tachy with metoprolol restarted and on lisinopril as well for HTN. Both alcohol WD and off the metoprolol abruptly contributors to Mondays readings, as well as anxiety concerns             Cont the meds presently  Emphasized staying well hydrated with Gatorade and water products as doing             If has any return of CP's, any SOB, passing out feelings, needs to go to the ER to be seen immediately  Next f/u Monday planned

## 2019-01-18 ENCOUNTER — Encounter: Payer: Self-pay | Admitting: Internal Medicine

## 2019-01-18 ENCOUNTER — Ambulatory Visit: Payer: Self-pay | Admitting: Internal Medicine

## 2019-01-18 ENCOUNTER — Other Ambulatory Visit: Payer: Self-pay

## 2019-01-18 VITALS — BP 145/94 | HR 96 | Temp 98.5°F | Resp 16 | Ht 69.0 in | Wt 179.0 lb

## 2019-01-18 DIAGNOSIS — I10 Essential (primary) hypertension: Secondary | ICD-10-CM

## 2019-01-18 DIAGNOSIS — F3289 Other specified depressive episodes: Secondary | ICD-10-CM

## 2019-01-18 DIAGNOSIS — F10230 Alcohol dependence with withdrawal, uncomplicated: Secondary | ICD-10-CM

## 2019-01-18 DIAGNOSIS — F32A Depression, unspecified: Secondary | ICD-10-CM | POA: Insufficient documentation

## 2019-01-18 DIAGNOSIS — F329 Major depressive disorder, single episode, unspecified: Secondary | ICD-10-CM | POA: Insufficient documentation

## 2019-01-18 DIAGNOSIS — F411 Generalized anxiety disorder: Secondary | ICD-10-CM

## 2019-01-18 DIAGNOSIS — F1010 Alcohol abuse, uncomplicated: Secondary | ICD-10-CM

## 2019-01-18 DIAGNOSIS — F10930 Alcohol use, unspecified with withdrawal, uncomplicated: Secondary | ICD-10-CM

## 2019-01-18 NOTE — Progress Notes (Signed)
S - Patient presents for F/u after I saw Mon and Thursday last week. Following for alcohol abuse concerns.  Prior notes reviewed with details.  Since Thursday, he has refrained from any alcohol use and notes he is feeling well, still walking at the track, and "feel better about myself".  Has been sober for 8 days, today is day 9 and very committed to staying sober and again stated that he really needs to get back to work.  He has been driving without concerns, drives every day.  Denies any recent CP, SOB, palpitations, abdominal pains, leg swelling, and the shakiness has remained improved, but still not gone.  He had an appt with [facility] this morning from 0800-0900 and brought a note confirming that he went to that appt. They have connected him with a support group and will be called today about more details with this Has an appt today at 4:00 pm with Darl Pikes from [place] for f/u. He remains on the same meds as prior and did get the metoprolol refilled and back on that and he noted he is not missing doses.   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  +tob use Alcoholabuse history- another relapse Sat, July 4th holiday, made it through this past weekend without drinking per his report.  Separated with his wife and was stressed with that as well.  Allergies  Allergen Reactions  . Compazine [Prochlorperazine Edisylate] Other (See Comments)    seizure   Current Outpatient Medications on File Prior to Visit  Medication Sig Dispense Refill  . aspirin EC 81 MG tablet Take 1 tablet (81 mg total) by mouth daily. 30 tablet 1  . lisinopril (ZESTRIL) 40 MG tablet Take 1 tablet (40 mg total) by mouth daily. 30 tablet 1  . metoprolol tartrate (LOPRESSOR) 50 MG tablet Take 1 tablet (50 mg total) by mouth 2 (two) times daily. 60 tablet 1  . naltrexone (DEPADE) 50 MG tablet Take 50 mg by mouth daily.     . pantoprazole (PROTONIX) 40 MG tablet Take 1 tablet (40 mg  total) by mouth daily. 30 tablet 1  . rosuvastatin (CRESTOR) 40 MG tablet Take 1 tablet (40 mg total) by mouth daily. (Patient taking differently: Take 40 mg by mouth every evening. ) 30 tablet 1  . sertraline (ZOLOFT) 50 MG tablet Take 50 mg by mouth daily.     . ticagrelor (BRILINTA) 60 MG TABS tablet Take 1 tablet (60 mg total) by mouth 2 (two) times daily. 60 tablet 1   No current facility-administered medications on file prior to visit.      O - NAD, masked,anxious appearing, but remains improved   BP (!) 145/94 (BP Location: Left Arm, Patient Position: Sitting, Cuff Size: Large)   Pulse 96   Temp 98.5 F (36.9 C) (Oral)   Resp 16   Ht 5\' 9"  (1.753 m)   Wt 179 lb (81.2 kg)   SpO2 97%   BMI 26.43 kg/m    BP recheck - 144/97 with machine on right,   HEENT - sclera anicteric, masked Car -RRR without m/g/r, not tachy, Pulm - CTA Abd - obese, NT, no guarding or rebound, Ext - no LE edema Neuro - Affect not flat, approp with conversation, speech was not rapid Grossly nonfocal No resting tremor, withholding arms outstretched, mild tremor (about the same as last visit), milderintention tremor persistswith F to N testing both right and left(still slightly  worse on left), no past pointing, no asterixis.Better balance on one foot today, a little more stable attempting to today. Romberg negative.Tandem walk was better today than a week ago.   A/P -1. Alcohol abuse/WD concerns - s/p [place] and he left rehab early and relapsed upon return homeand again July 4th weekend. Has had [facility] involvedand now has [facility] involved (a more intensive outpatient program for alcohol dependence).Iagaintoday noted that he needs a support system in place to help be successful with alcohol abstinence, and how challenging the next weeks will be. I do agree that returning to work will be a helpful part of his treatment and  management at present, with safely returning the key, with no alcohol consumption at work critical and he noted he has never drank at work in his past.  The importance of remaining without any alcohol consumption at present was emphasized and he noted he would.   Will Ok his return to work duties effective tomorrow after his appt with [place] later this afternoon. Understands the importance of a safe return to work and having [place] [facility], support group thru them and [facility] remaining involved and f/u with them as rec'ed important.   Forms completed for work return             Will F/u Monday to re-assess, sooner prn  2. Depression/anxiety- zoloft is helping and to continue. Heremainsnot actively suicidal at present Counselingalso to continue andhelpful  3. Increased BP again today -  metoprolol was restarted and on lisinopril as well for HTN. Both alcohol WD and off the metoprolol abruptly contributors to prior higher BP readings, as well as anxiety concerns, and now back on metoprolol. Cont the meds presently, may need to adjust nt he near future pending rechecks on f/u visits             Continue staying well hydrated with Gatorade and water products as doing and avoiding alcohol  Next f/u Monday planned, sooner prn

## 2019-01-25 ENCOUNTER — Encounter: Payer: Self-pay | Admitting: Internal Medicine

## 2019-01-25 ENCOUNTER — Other Ambulatory Visit: Payer: Self-pay

## 2019-01-25 ENCOUNTER — Ambulatory Visit: Payer: Self-pay | Admitting: Internal Medicine

## 2019-01-25 VITALS — BP 162/99 | HR 98 | Temp 99.2°F | Resp 20 | Ht 69.0 in | Wt 183.0 lb

## 2019-01-25 DIAGNOSIS — F10230 Alcohol dependence with withdrawal, uncomplicated: Secondary | ICD-10-CM

## 2019-01-25 DIAGNOSIS — F411 Generalized anxiety disorder: Secondary | ICD-10-CM

## 2019-01-25 DIAGNOSIS — I10 Essential (primary) hypertension: Secondary | ICD-10-CM

## 2019-01-25 DIAGNOSIS — F3289 Other specified depressive episodes: Secondary | ICD-10-CM

## 2019-01-25 DIAGNOSIS — F10930 Alcohol use, unspecified with withdrawal, uncomplicated: Secondary | ICD-10-CM

## 2019-01-25 DIAGNOSIS — F1010 Alcohol abuse, uncomplicated: Secondary | ICD-10-CM

## 2019-01-25 MED ORDER — HYDROCHLOROTHIAZIDE 25 MG PO TABS: 25.0000 mg | ORAL_TABLET | Freq: Every day | ORAL | 3 refills | Status: DC

## 2019-01-25 NOTE — Progress Notes (Signed)
S - Patient presents for F/u after I last sawJuly 13, cleared to RTW on the 14th Following foralcohol abuse concerns.  Prior notes reviewed with details.  He notes returning to work has been helpful and good for him, no concerns with job functions. Today is Day 16 free from alcohol. Remains committed to staying sober  Denies any recent CP, SOB, palpitations, N/V, abdominal pains, leg swelling, and the shakiness has remained improved, but again still not gone.  He has an appt with Manuela Schwartz from [place] today to talk, and next one with [facility] is July 30th  They have connected him with a support group and he still talks to his friend who has remained sober for years 4X/week to help.  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   +tob use Alcoholabuse history- last relapse Sat, July 4th holiday  Separated with his wife         Allergies  Allergen Reactions  . Compazine [Prochlorperazine Edisylate] Other (See Comments)    seizure   Current Outpatient Medications on File Prior to Visit  Medication Sig Dispense Refill  . aspirin EC 81 MG tablet Take 1 tablet (81 mg total) by mouth daily. 30 tablet 1  . lisinopril (ZESTRIL) 40 MG tablet Take 1 tablet (40 mg total) by mouth daily. 30 tablet 1  . metoprolol tartrate (LOPRESSOR) 50 MG tablet Take 1 tablet (50 mg total) by mouth 2 (two) times daily. 60 tablet 1  . naltrexone (DEPADE) 50 MG tablet Take 50 mg by mouth daily.     . pantoprazole (PROTONIX) 40 MG tablet Take 1 tablet (40 mg total) by mouth daily. 30 tablet 1  . rosuvastatin (CRESTOR) 40 MG tablet Take 1 tablet (40 mg total) by mouth daily. (Patient taking differently: Take 40 mg by mouth every evening. ) 30 tablet 1  . sertraline (ZOLOFT) 50 MG tablet Take 50 mg by mouth daily.     . ticagrelor (BRILINTA) 60 MG TABS tablet Take 1 tablet (60 mg total) by mouth 2 (two) times daily. 60 tablet 1   No current facility-administered medications on file prior to visit.    Notes not  missing BP meds and noted last time he cut the diuretic out of his regimen, his BP went up.     O - NAD, masked,less anxious appearing today than last visit  BP (!) 162/99 (BP Location: Right Arm, Patient Position: Sitting, Cuff Size: Large)   Pulse 98   Temp 99.2 F (37.3 C) (Oral)   Resp 20   Ht 5\' 9"  (1.753 m)   Wt 183 lb (83 kg)   SpO2 97%   BMI 27.02 kg/m    HEENT - sclera anicteric, masked Car -RRR without m/g/r, not tachy, Pulm - CTA Abd - obese, NT, no guarding or rebound, Ext - no LE edema Neuro - Affect not flat, approp with conversation, speech was not rapid Grossly nonfocal Noresting tremor,withholding arms outstretched,mild tremor persists (about the same as last visit),milderintention tremor persistswith F to N testing both right and left(still slightlyworse on left) and a little better today noted, no past pointing, no asterixis.Gait normal walking back in the office.  A/P -1. Alcohol abuse/WD concerns - [place] and he left rehab early and relapsed upon return homeand again July 4th weekend. Has had [facility] involvedand now has [facility] involved (a more intensive outpatient program for alcohol dependence).Iagaintodaynoted that he needs a support system in place to help be successful  with alcohol abstinence, and how challenging this will continue to be. returning to work was a helpful part of his treatment and management and to continue, with no alcohol consumption at work critical and he noted he has never drank at work in his past.  The importance of remaining without any alcohol consumption at present was emphasized and he noted he would. Understands the importanceof having [facility], support group thru them and [facility] remaining involved and f/u with them as rec'ed important.  Will F/u next Monday to re-assess, sooner prn  2.  Depression/anxiety- zoloft is helping and to continue. Heremainsnot actively suicidal at present Counselingalso to continue andhelpful  3. Increased BPagain today - do feel adding back the HCTZ will be helpful at present Cont the meds presently, and added HCTZ - 25 mg daily and assess  Staying well hydrated as he noted he is doing is very important and he noted he will and uses gatorades, water products to do so  Nextf/u Monday planned, sooner prn

## 2019-02-01 ENCOUNTER — Ambulatory Visit: Payer: Self-pay | Admitting: Internal Medicine

## 2019-02-02 ENCOUNTER — Telehealth: Payer: Self-pay

## 2019-02-02 NOTE — Telephone Encounter (Signed)
Called pt again and he states that he has had diarrhea and vomiting x 2 days and he is better now and no sx. No fever or any other covid related sx. He declined appt today but says he will come in tomorrow at 8am to be seen by the MD.

## 2019-02-02 NOTE — Telephone Encounter (Signed)
Left message to call back, needs a appt ASAP

## 2019-02-03 ENCOUNTER — Emergency Department (HOSPITAL_COMMUNITY): Payer: 59

## 2019-02-03 ENCOUNTER — Inpatient Hospital Stay (HOSPITAL_COMMUNITY)
Admission: EM | Admit: 2019-02-03 | Discharge: 2019-02-10 | DRG: 083 | Disposition: A | Discharge status: Home Health | Payer: 59 | Attending: Family Medicine | Admitting: Family Medicine

## 2019-02-03 ENCOUNTER — Ambulatory Visit: Payer: Self-pay | Admitting: Internal Medicine

## 2019-02-03 ENCOUNTER — Telehealth: Payer: Self-pay

## 2019-02-03 ENCOUNTER — Other Ambulatory Visit: Payer: Self-pay

## 2019-02-03 DIAGNOSIS — F329 Major depressive disorder, single episode, unspecified: Secondary | ICD-10-CM | POA: Diagnosis present

## 2019-02-03 DIAGNOSIS — E871 Hypo-osmolality and hyponatremia: Secondary | ICD-10-CM | POA: Diagnosis present

## 2019-02-03 DIAGNOSIS — F1020 Alcohol dependence, uncomplicated: Secondary | ICD-10-CM | POA: Diagnosis not present

## 2019-02-03 DIAGNOSIS — I2510 Atherosclerotic heart disease of native coronary artery without angina pectoris: Secondary | ICD-10-CM | POA: Diagnosis present

## 2019-02-03 DIAGNOSIS — F17210 Nicotine dependence, cigarettes, uncomplicated: Secondary | ICD-10-CM | POA: Diagnosis present

## 2019-02-03 DIAGNOSIS — N179 Acute kidney failure, unspecified: Secondary | ICD-10-CM | POA: Diagnosis not present

## 2019-02-03 DIAGNOSIS — D72829 Elevated white blood cell count, unspecified: Secondary | ICD-10-CM | POA: Diagnosis present

## 2019-02-03 DIAGNOSIS — I252 Old myocardial infarction: Secondary | ICD-10-CM | POA: Diagnosis not present

## 2019-02-03 DIAGNOSIS — Z20828 Contact with and (suspected) exposure to other viral communicable diseases: Secondary | ICD-10-CM | POA: Diagnosis not present

## 2019-02-03 DIAGNOSIS — S40012A Contusion of left shoulder, initial encounter: Secondary | ICD-10-CM | POA: Diagnosis not present

## 2019-02-03 DIAGNOSIS — E876 Hypokalemia: Secondary | ICD-10-CM | POA: Diagnosis present

## 2019-02-03 DIAGNOSIS — R110 Nausea: Secondary | ICD-10-CM | POA: Diagnosis not present

## 2019-02-03 DIAGNOSIS — F1010 Alcohol abuse, uncomplicated: Secondary | ICD-10-CM | POA: Diagnosis present

## 2019-02-03 DIAGNOSIS — F10231 Alcohol dependence with withdrawal delirium: Secondary | ICD-10-CM | POA: Diagnosis not present

## 2019-02-03 DIAGNOSIS — W182XXA Fall in (into) shower or empty bathtub, initial encounter: Secondary | ICD-10-CM | POA: Diagnosis present

## 2019-02-03 DIAGNOSIS — M6282 Rhabdomyolysis: Secondary | ICD-10-CM | POA: Diagnosis not present

## 2019-02-03 DIAGNOSIS — S066X9A Traumatic subarachnoid hemorrhage with loss of consciousness of unspecified duration, initial encounter: Principal | ICD-10-CM | POA: Diagnosis present

## 2019-02-03 DIAGNOSIS — R55 Syncope and collapse: Secondary | ICD-10-CM | POA: Diagnosis not present

## 2019-02-03 DIAGNOSIS — Z955 Presence of coronary angioplasty implant and graft: Secondary | ICD-10-CM

## 2019-02-03 DIAGNOSIS — E861 Hypovolemia: Secondary | ICD-10-CM | POA: Diagnosis present

## 2019-02-03 DIAGNOSIS — I10 Essential (primary) hypertension: Secondary | ICD-10-CM | POA: Diagnosis present

## 2019-02-03 DIAGNOSIS — K219 Gastro-esophageal reflux disease without esophagitis: Secondary | ICD-10-CM | POA: Diagnosis present

## 2019-02-03 DIAGNOSIS — J9690 Respiratory failure, unspecified, unspecified whether with hypoxia or hypercapnia: Secondary | ICD-10-CM | POA: Diagnosis not present

## 2019-02-03 DIAGNOSIS — F419 Anxiety disorder, unspecified: Secondary | ICD-10-CM | POA: Diagnosis present

## 2019-02-03 DIAGNOSIS — Z8249 Family history of ischemic heart disease and other diseases of the circulatory system: Secondary | ICD-10-CM

## 2019-02-03 DIAGNOSIS — E860 Dehydration: Secondary | ICD-10-CM | POA: Diagnosis present

## 2019-02-03 DIAGNOSIS — I609 Nontraumatic subarachnoid hemorrhage, unspecified: Secondary | ICD-10-CM | POA: Diagnosis not present

## 2019-02-03 DIAGNOSIS — E785 Hyperlipidemia, unspecified: Secondary | ICD-10-CM | POA: Diagnosis not present

## 2019-02-03 DIAGNOSIS — R000 Tachycardia, unspecified: Secondary | ICD-10-CM | POA: Diagnosis not present

## 2019-02-03 DIAGNOSIS — S199XXA Unspecified injury of neck, initial encounter: Secondary | ICD-10-CM | POA: Diagnosis not present

## 2019-02-03 DIAGNOSIS — E7800 Pure hypercholesterolemia, unspecified: Secondary | ICD-10-CM | POA: Diagnosis present

## 2019-02-03 DIAGNOSIS — F10239 Alcohol dependence with withdrawal, unspecified: Secondary | ICD-10-CM | POA: Diagnosis not present

## 2019-02-03 DIAGNOSIS — I951 Orthostatic hypotension: Secondary | ICD-10-CM

## 2019-02-03 DIAGNOSIS — S0003XA Contusion of scalp, initial encounter: Secondary | ICD-10-CM | POA: Diagnosis not present

## 2019-02-03 DIAGNOSIS — S0083XA Contusion of other part of head, initial encounter: Secondary | ICD-10-CM | POA: Diagnosis not present

## 2019-02-03 DIAGNOSIS — R69 Illness, unspecified: Secondary | ICD-10-CM | POA: Diagnosis not present

## 2019-02-03 DIAGNOSIS — Z8349 Family history of other endocrine, nutritional and metabolic diseases: Secondary | ICD-10-CM

## 2019-02-03 DIAGNOSIS — Z7902 Long term (current) use of antithrombotics/antiplatelets: Secondary | ICD-10-CM

## 2019-02-03 DIAGNOSIS — S066X0A Traumatic subarachnoid hemorrhage without loss of consciousness, initial encounter: Secondary | ICD-10-CM | POA: Diagnosis not present

## 2019-02-03 DIAGNOSIS — R52 Pain, unspecified: Secondary | ICD-10-CM | POA: Diagnosis not present

## 2019-02-03 DIAGNOSIS — R609 Edema, unspecified: Secondary | ICD-10-CM | POA: Diagnosis not present

## 2019-02-03 DIAGNOSIS — Z7982 Long term (current) use of aspirin: Secondary | ICD-10-CM

## 2019-02-03 LAB — CBC WITH DIFFERENTIAL/PLATELET
Abs Immature Granulocytes: 0.06 10*3/uL (ref 0.00–0.07)
Basophils Absolute: 0 10*3/uL (ref 0.0–0.1)
Basophils Relative: 0 %
Eosinophils Absolute: 0 10*3/uL (ref 0.0–0.5)
Eosinophils Relative: 0 %
HCT: 31.6 % — ABNORMAL LOW (ref 39.0–52.0)
Hemoglobin: 11.3 g/dL — ABNORMAL LOW (ref 13.0–17.0)
Immature Granulocytes: 1 %
Lymphocytes Relative: 2 %
Lymphs Abs: 0.3 10*3/uL — ABNORMAL LOW (ref 0.7–4.0)
MCH: 32.8 pg (ref 26.0–34.0)
MCHC: 35.8 g/dL (ref 30.0–36.0)
MCV: 91.9 fL (ref 80.0–100.0)
Monocytes Absolute: 1.2 10*3/uL — ABNORMAL HIGH (ref 0.1–1.0)
Monocytes Relative: 10 %
Neutro Abs: 11 10*3/uL — ABNORMAL HIGH (ref 1.7–7.7)
Neutrophils Relative %: 87 %
Platelets: 221 10*3/uL (ref 150–400)
RBC: 3.44 MIL/uL — ABNORMAL LOW (ref 4.22–5.81)
RDW: 12.7 % (ref 11.5–15.5)
WBC: 12.5 10*3/uL — ABNORMAL HIGH (ref 4.0–10.5)
nRBC: 0 % (ref 0.0–0.2)

## 2019-02-03 LAB — COMPREHENSIVE METABOLIC PANEL
ALT: 116 U/L — ABNORMAL HIGH (ref 0–44)
AST: 143 U/L — ABNORMAL HIGH (ref 15–41)
Albumin: 3.6 g/dL (ref 3.5–5.0)
Alkaline Phosphatase: 65 U/L (ref 38–126)
Anion gap: 23 — ABNORMAL HIGH (ref 5–15)
BUN: 21 mg/dL — ABNORMAL HIGH (ref 6–20)
CO2: 20 mmol/L — ABNORMAL LOW (ref 22–32)
Calcium: 8.6 mg/dL — ABNORMAL LOW (ref 8.9–10.3)
Chloride: 75 mmol/L — ABNORMAL LOW (ref 98–111)
Creatinine, Ser: 1.8 mg/dL — ABNORMAL HIGH (ref 0.61–1.24)
GFR calc Af Amer: 47 mL/min — ABNORMAL LOW (ref >60)
GFR calc non Af Amer: 41 mL/min — ABNORMAL LOW (ref >60)
Glucose, Bld: 138 mg/dL — ABNORMAL HIGH (ref 70–99)
Potassium: 4.1 mmol/L (ref 3.5–5.1)
Sodium: 118 mmol/L — CL (ref 135–145)
Total Bilirubin: 1.9 mg/dL — ABNORMAL HIGH (ref 0.3–1.2)
Total Protein: 6.3 g/dL — ABNORMAL LOW (ref 6.5–8.1)

## 2019-02-03 LAB — MRSA PCR SCREENING: MRSA by PCR: NEGATIVE

## 2019-02-03 LAB — BASIC METABOLIC PANEL
Anion gap: 16 — ABNORMAL HIGH (ref 5–15)
BUN: 17 mg/dL (ref 6–20)
CO2: 22 mmol/L (ref 22–32)
Calcium: 7.8 mg/dL — ABNORMAL LOW (ref 8.9–10.3)
Chloride: 82 mmol/L — ABNORMAL LOW (ref 98–111)
Creatinine, Ser: 1.39 mg/dL — ABNORMAL HIGH (ref 0.61–1.24)
GFR calc Af Amer: >60 mL/min (ref >60)
GFR calc non Af Amer: 55 mL/min — ABNORMAL LOW (ref >60)
Glucose, Bld: 119 mg/dL — ABNORMAL HIGH (ref 70–99)
Potassium: 3.4 mmol/L — ABNORMAL LOW (ref 3.5–5.1)
Sodium: 120 mmol/L — ABNORMAL LOW (ref 135–145)

## 2019-02-03 LAB — BASIC METABOLIC PANEL WITH GFR
Anion gap: 16 — ABNORMAL HIGH (ref 5–15)
BUN: 19 mg/dL (ref 6–20)
CO2: 22 mmol/L (ref 22–32)
Calcium: 7.9 mg/dL — ABNORMAL LOW (ref 8.9–10.3)
Chloride: 81 mmol/L — ABNORMAL LOW (ref 98–111)
Creatinine, Ser: 1.59 mg/dL — ABNORMAL HIGH (ref 0.61–1.24)
GFR calc Af Amer: 55 mL/min — ABNORMAL LOW
GFR calc non Af Amer: 47 mL/min — ABNORMAL LOW
Glucose, Bld: 125 mg/dL — ABNORMAL HIGH (ref 70–99)
Potassium: 3.9 mmol/L (ref 3.5–5.1)
Sodium: 119 mmol/L — CL (ref 135–145)

## 2019-02-03 LAB — PROTIME-INR
INR: 1 (ref 0.8–1.2)
Prothrombin Time: 12.8 seconds (ref 11.4–15.2)

## 2019-02-03 LAB — CBC
HCT: 27.3 % — ABNORMAL LOW (ref 39.0–52.0)
Hemoglobin: 9.8 g/dL — ABNORMAL LOW (ref 13.0–17.0)
MCH: 32.9 pg (ref 26.0–34.0)
MCHC: 35.9 g/dL (ref 30.0–36.0)
MCV: 91.6 fL (ref 80.0–100.0)
Platelets: 186 10*3/uL (ref 150–400)
RBC: 2.98 MIL/uL — ABNORMAL LOW (ref 4.22–5.81)
RDW: 12.6 % (ref 11.5–15.5)
WBC: 10.4 10*3/uL (ref 4.0–10.5)
nRBC: 0 % (ref 0.0–0.2)

## 2019-02-03 LAB — CORTISOL: Cortisol, Plasma: 32.2 ug/dL

## 2019-02-03 LAB — TSH: TSH: 0.359 u[IU]/mL (ref 0.350–4.500)

## 2019-02-03 LAB — TROPONIN I (HIGH SENSITIVITY)
Troponin I (High Sensitivity): 14 ng/L (ref <18)
Troponin I (High Sensitivity): 16 ng/L (ref <18)

## 2019-02-03 LAB — OSMOLALITY, URINE: Osmolality, Ur: 454 mosm/kg (ref 300–900)

## 2019-02-03 LAB — CK: Total CK: 1973 U/L — ABNORMAL HIGH (ref 49–397)

## 2019-02-03 LAB — MAGNESIUM: Magnesium: 1.5 mg/dL — ABNORMAL LOW (ref 1.7–2.4)

## 2019-02-03 LAB — SARS CORONAVIRUS 2 BY RT PCR (HOSPITAL ORDER, PERFORMED IN ~~LOC~~ HOSPITAL LAB): SARS Coronavirus 2: NEGATIVE

## 2019-02-03 LAB — PHOSPHORUS: Phosphorus: 2.3 mg/dL — ABNORMAL LOW (ref 2.5–4.6)

## 2019-02-03 LAB — SODIUM, URINE, RANDOM: Sodium, Ur: 27 mmol/L

## 2019-02-03 LAB — OSMOLALITY: Osmolality: 251 mosm/kg — ABNORMAL LOW (ref 275–295)

## 2019-02-03 LAB — ETHANOL: Alcohol, Ethyl (B): <10 mg/dL (ref <10)

## 2019-02-03 MED ORDER — SODIUM CHLORIDE 0.9 % IV SOLN
0.4000 ug/kg | Freq: Once | INTRAVENOUS | Status: AC
  Administered 2019-02-03: 32 ug via INTRAVENOUS
  Filled 2019-02-03: qty 8

## 2019-02-03 MED ORDER — VITAMIN B-1 100 MG PO TABS
100.0000 mg | ORAL_TABLET | Freq: Every day | ORAL | Status: DC
  Administered 2019-02-04 – 2019-02-06 (×2): 100 mg via ORAL
  Filled 2019-02-03 (×3): qty 1

## 2019-02-03 MED ORDER — SODIUM CHLORIDE 0.9 % IV BOLUS
1000.0000 mL | Freq: Once | INTRAVENOUS | Status: AC
  Administered 2019-02-03: 16:00:00 1000 mL via INTRAVENOUS

## 2019-02-03 MED ORDER — THIAMINE HCL 100 MG/ML IJ SOLN: Freq: Once | INTRAVENOUS | Status: DC

## 2019-02-03 MED ORDER — LORAZEPAM 2 MG/ML IJ SOLN
2.0000 mg | INTRAMUSCULAR | Status: DC | PRN
  Administered 2019-02-03 – 2019-02-04 (×5): 2 mg via INTRAVENOUS
  Filled 2019-02-03 (×5): qty 1

## 2019-02-03 MED ORDER — ONDANSETRON HCL 4 MG/2ML IJ SOLN
4.0000 mg | Freq: Once | INTRAMUSCULAR | Status: AC
  Administered 2019-02-03: 4 mg via INTRAVENOUS
  Filled 2019-02-03: qty 2

## 2019-02-03 MED ORDER — FOLIC ACID 1 MG PO TABS
1.0000 mg | ORAL_TABLET | Freq: Every day | ORAL | Status: DC
  Administered 2019-02-04: 12:00:00 1 mg via ORAL
  Filled 2019-02-03 (×2): qty 1

## 2019-02-03 MED ORDER — ADULT MULTIVITAMIN W/MINERALS CH
1.0000 | ORAL_TABLET | Freq: Every day | ORAL | Status: DC
  Administered 2019-02-04 – 2019-02-06 (×2): 1 via ORAL
  Filled 2019-02-03 (×3): qty 1

## 2019-02-03 MED ORDER — SODIUM CHLORIDE 0.9 % IV BOLUS
1000.0000 mL | Freq: Once | INTRAVENOUS | Status: AC
  Administered 2019-02-03: 18:00:00 1000 mL via INTRAVENOUS

## 2019-02-03 NOTE — ED Provider Notes (Signed)
[facility] EMERGENCY DEPARTMENT Provider Note   CSN: [id number] Arrival date & time: 02/03/19  1508    History   Chief Complaint Chief Complaint  Patient presents with   Loss of Consciousness   Fall    HPI Dustin Velazquez is a 58 y.o. male with a PMH significant for alcohol use disorder, HLD, HTN, depression and anxiety who presents with generalized weakness and recent falls.  He says that he has felt very stressed recently after a separation from his wife a few months ago.  He has lost 28 pounds in the last 6 months because he has not been eating or drinking as well.  He began to feel weak and lightheaded yesterday.  He fell forward and hit his left shoulder and left side of his face on his sink yesterday evening and denies losing consciousness before or after the fall.  He also fell onto the floor this morning, hitting his head again.  He denies loss of consciousness at this time as well.  He denies chest pain, shortness of breath, and diarrhea, but does say that he has been frequently nauseated especially when waking in the morning and will feel diaphoretic at that time as well.  He denies night sweats.  He continues to smoke about 6 cigarettes daily, but he says that he has not consumed alcohol for the past month.     Past Medical History:  Diagnosis Date   ETOH abuse    GERD (gastroesophageal reflux disease)    Hypercholesteremia    Hypertension    Sleep walking disorder     Patient Active Problem List   Diagnosis Date Noted   Hyponatremia 02/03/2019   Depression 01/18/2019   HTN (hypertension) 01/14/2019   Alcohol withdrawal ([place]) 01/14/2019   Anxiety state 01/14/2019   MDD (major depressive disorder), recurrent episode, severe ([place]) 12/23/2018   Severe recurrent major depression without psychotic features ([place]) 12/15/2018   Suicidal behavior 12/14/2018   Alcohol abuse 12/12/2018   Non-STEMI (non-ST elevated myocardial infarction)  ([place]) 12/29/2016    Past Surgical History:  Procedure Laterality Date   APPENDECTOMY     CORONARY STENT INTERVENTION N/A 12/30/2016   Procedure: Coronary Stent Intervention;  Surgeon: Wellington Hampshire, MD;  Location: [place] [facility];  Service: Cardiovascular;  Laterality: N/A;   KNEE SURGERY     LEFT HEART CATH AND CORONARY ANGIOGRAPHY N/A 12/30/2016   Procedure: Left Heart Cath and Coronary Angiography;  Surgeon: Wellington Hampshire, MD;  Location: [place] [facility];  Service: Cardiovascular;  Laterality: N/A;        Home Medications    Prior to Admission medications   Medication Sig Start Date End Date Taking? Authorizing Provider  aspirin EC 81 MG tablet Take 1 tablet (81 mg total) by mouth daily. 12/16/18   Clapacs, Madie Reno, MD  hydrochlorothiazide (HYDRODIURIL) 25 MG tablet Take 1 tablet (25 mg total) by mouth daily. 01/25/19   Towanda Malkin, MD  lisinopril (ZESTRIL) 40 MG tablet Take 1 tablet (40 mg total) by mouth daily. 12/16/18   Clapacs, Madie Reno, MD  metoprolol tartrate (LOPRESSOR) 50 MG tablet Take 1 tablet (50 mg total) by mouth 2 (two) times daily. 12/16/18   Clapacs, Madie Reno, MD  naltrexone (DEPADE) 50 MG tablet Take 50 mg by mouth daily.  12/17/18   [provider]  omeprazole (PRILOSEC) 20 MG capsule Take 20 mg by mouth 2 (two) times a day. 01/18/19   [provider]  pantoprazole (PROTONIX) 40 MG tablet Take 1 tablet (40 mg total) by mouth daily. 12/17/18   Clapacs, Jackquline Denmark, MD  rosuvastatin (CRESTOR) 40 MG tablet Take 1 tablet (40 mg total) by mouth daily. Patient taking differently: Take 40 mg by mouth every evening.  12/16/18 03/16/19  Clapacs, Jackquline Denmark, MD  sertraline (ZOLOFT) 50 MG tablet Take 50 mg by mouth daily.  12/21/18   [provider]  ticagrelor (BRILINTA) 60 MG TABS tablet Take 1 tablet (60 mg total) by mouth 2 (two) times daily. 12/16/18   Clapacs, Jackquline Denmark, MD    Family History Family History  Problem Relation Age of Onset    Hypertension Mother    Hyperlipidemia Father     Social History Social History   Tobacco Use   Smoking status: Current Every Day Smoker    Packs/day: 0.25    Years: 30.00    Pack years: 7.50    Types: Cigarettes   Smokeless tobacco: Never Used  Substance Use Topics   Alcohol use: Yes    Alcohol/week: 8.0 standard drinks    Types: 8 Cans of beer per week    Comment: 1/2 pint tequila a day, pt states that he is trying to quit    Drug use: No     Allergies   Compazine [prochlorperazine edisylate]   Review of Systems Review of Systems  Constitutional: Positive for activity change, appetite change, diaphoresis, fatigue and unexpected weight change. Negative for fever.  HENT: Negative for congestion and rhinorrhea.   Respiratory: Negative for cough and shortness of breath.   Cardiovascular: Negative for chest pain, palpitations and leg swelling.  Gastrointestinal: Positive for nausea. Negative for abdominal pain, constipation and diarrhea.  Genitourinary: Negative for difficulty urinating and dysuria.  Musculoskeletal: Negative for back pain and myalgias.  Skin: Negative for color change.  Neurological: Positive for weakness (generalized) and light-headedness. Negative for syncope, facial asymmetry, speech difficulty and headaches.  Hematological: Bruises/bleeds easily (due to Brillinta and ASA).  Psychiatric/Behavioral: The patient is nervous/anxious.      Physical Exam Updated Vital Signs BP 137/84    Pulse (!) 101    Temp 98 F (36.7 C) (Oral)    Resp 19    Ht  (1.753 m)    Wt 79.8 kg    SpO2 98%    BMI 25.99 kg/m   Physical Exam Constitutional:      General: He is not in acute distress.    Appearance: He is normal weight. He is not toxic-appearing or diaphoretic.  HENT:     Head:     Comments: Ecchymosis around the left eye and dried blood on patient's face.  Ecchymosis of left shoulder.    Right Ear: External ear normal.     Left Ear: External ear  normal.     Nose: Nose normal. No congestion or rhinorrhea.     Mouth/Throat:     Mouth: Mucous membranes are dry.  Eyes:     Extraocular Movements: Extraocular movements intact.     Conjunctiva/sclera: Conjunctivae normal.     Pupils: Pupils are equal, round, and reactive to light.  Neck:     Musculoskeletal: Normal range of motion. No muscular tenderness.  Cardiovascular:     Rate and Rhythm: Regular rhythm. Tachycardia present.     Pulses: Normal pulses.  Pulmonary:     Effort: Pulmonary effort is normal. No respiratory distress.     Breath sounds: Normal breath sounds.  Abdominal:  General: Abdomen is flat.     Palpations: Abdomen is soft.     Tenderness: There is no abdominal tenderness.  Musculoskeletal: Normal range of motion.        General: No swelling or tenderness.     Comments: L shoulder with minimal ttp, full ROM  Skin:    General: Skin is warm and dry.  Neurological:     General: No focal deficit present.     Mental Status: He is alert and oriented to person, place, and time.     Cranial Nerves: No cranial nerve deficit.     Coordination: Coordination normal.  Psychiatric:        Mood and Affect: Mood normal.        Behavior: Behavior normal.      ED Treatments / Results  Labs (all labs ordered are listed, but only abnormal results are displayed) Labs Reviewed  CBC WITH DIFFERENTIAL/PLATELET - Abnormal; Notable for the following components:      Result Value   WBC 12.5 (*)    RBC 3.44 (*)    Hemoglobin 11.3 (*)    HCT 31.6 (*)    Neutro Abs 11.0 (*)    Lymphs Abs 0.3 (*)    Monocytes Absolute 1.2 (*)    All other components within normal limits  COMPREHENSIVE METABOLIC PANEL - Abnormal; Notable for the following components:   Sodium 118 (*)    Chloride 75 (*)    CO2 20 (*)    Glucose, Bld 138 (*)    BUN 21 (*)    Creatinine, Ser 1.80 (*)    Calcium 8.6 (*)    Total Protein 6.3 (*)    AST 143 (*)    ALT 116 (*)    Total Bilirubin 1.9 (*)     GFR calc non Af Amer 41 (*)    GFR calc Af Amer 47 (*)    Anion gap 23 (*)    All other components within normal limits  OSMOLALITY - Abnormal; Notable for the following components:   Osmolality 251 (*)    All other components within normal limits  BASIC METABOLIC PANEL - Abnormal; Notable for the following components:   Sodium 119 (*)    Chloride 81 (*)    Glucose, Bld 125 (*)    Creatinine, Ser 1.59 (*)    Calcium 7.9 (*)    GFR calc non Af Amer 47 (*)    GFR calc Af Amer 55 (*)    Anion gap 16 (*)    All other components within normal limits  SARS CORONAVIRUS 2 (HOSPITAL ORDER, PERFORMED IN [facility])  ETHANOL  SODIUM, URINE, RANDOM  TSH  OSMOLALITY, URINE  PROTIME-INR  BASIC METABOLIC PANEL  BASIC METABOLIC PANEL  BASIC METABOLIC PANEL  BASIC METABOLIC PANEL  MAGNESIUM  PHOSPHORUS  CORTISOL  CBC  CK  TROPONIN I (HIGH SENSITIVITY)  TROPONIN I (HIGH SENSITIVITY)    EKG EKG Interpretation  Date/Time:  Wednesday February 03 2019 15:17:35 EDT Ventricular Rate:  100 PR Interval:    QRS Duration: 89 QT Interval:  397 QTC Calculation: 513 R Axis:   22 Text Interpretation:  Sinus tachycardia Prolonged QT interval No significant change since last tracing Confirmed by Richardean CanalYao, David H [phone]([id number]) on 02/03/2019 3:28:15 PM   Radiology Dg Chest 2 View  Result Date: 02/03/2019 CLINICAL DATA:  Syncopal episode. EXAM: CHEST - 2 VIEW COMPARISON:  01/03/2019 FINDINGS: Unchanged cardiac silhouette and mediastinal contours. Retrocardiac air and  fluid containing structure is unchanged compatible with a hiatal hernia. Grossly unchanged bilateral infrahilar heterogeneous opacities favored to represent atelectasis. No discrete focal airspace opacities. No pleural effusion or pneumothorax. No evidence of edema. No acute osseous abnormalities. Stigmata of DISH within the lower thoracic spine. IMPRESSION: 1.  No acute cardiopulmonary disease. 2. Hiatal hernia, unchanged.  Electronically Signed   By: Simonne ComeJohn  Watts M.D.   On: 02/03/2019 16:50   Ct Head Wo Contrast  Result Date: 02/03/2019 CLINICAL DATA:  Fall, head trauma, anticoagulation EXAM: CT HEAD WITHOUT CONTRAST CT MAXILLOFACIAL WITHOUT CONTRAST CT CERVICAL SPINE WITHOUT CONTRAST TECHNIQUE: Multidetector CT imaging of the head, cervical spine, and maxillofacial structures were performed using the standard protocol without intravenous contrast. Multiplanar CT image reconstructions of the cervical spine and maxillofacial structures were also generated. COMPARISON:  None. FINDINGS: CT HEAD FINDINGS Brain: There is subarachnoid hemorrhage about the medial bilateral frontal lobes and falx (series 4, image 23). Vascular: No hyperdense vessel or unexpected calcification. CT FACIAL BONES FINDINGS Skull: Normal. Negative for fracture or focal lesion. Facial bones: No displaced fractures or dislocations. Sinuses/Orbits: No acute finding. Other: Large left parietal and right occipital scalp hematomas. Soft tissue contusion about the left cheek, orbit, and forehead. CT CERVICAL SPINE FINDINGS Alignment: Straightening of the normal cervical lordosis. Skull base and vertebrae: No acute fracture. No primary bone lesion or focal pathologic process. Soft tissues and spinal canal: No prevertebral fluid or swelling. No visible canal hematoma. Disc levels: Mild disc space height loss and osteophytosis of C5 through C7. Upper chest: Negative. Other: None. IMPRESSION: 1. There is subarachnoid hemorrhage about the medial bilateral frontal lobes and falx (series 4, image 23). 2.  No displaced fracture or dislocation of the facial bones. 3. Large left parietal and right occipital scalp hematomas. Soft tissue contusion about the left cheek, orbit, and forehead. 4.  No fracture or static subluxation of the cervical spine. These results were called by telephone at the time of interpretation on 02/03/2019 at 4:48 pm to Dr. Chaney MallingAVID YAO , who verbally  acknowledged these results. Electronically Signed   By: Lauralyn PrimesAlex  Bibbey M.D.   On: 02/03/2019 16:57   Ct Cervical Spine Wo Contrast  Result Date: 02/03/2019 CLINICAL DATA:  Fall, head trauma, anticoagulation EXAM: CT HEAD WITHOUT CONTRAST CT MAXILLOFACIAL WITHOUT CONTRAST CT CERVICAL SPINE WITHOUT CONTRAST TECHNIQUE: Multidetector CT imaging of the head, cervical spine, and maxillofacial structures were performed using the standard protocol without intravenous contrast. Multiplanar CT image reconstructions of the cervical spine and maxillofacial structures were also generated. COMPARISON:  None. FINDINGS: CT HEAD FINDINGS Brain: There is subarachnoid hemorrhage about the medial bilateral frontal lobes and falx (series 4, image 23). Vascular: No hyperdense vessel or unexpected calcification. CT FACIAL BONES FINDINGS Skull: Normal. Negative for fracture or focal lesion. Facial bones: No displaced fractures or dislocations. Sinuses/Orbits: No acute finding. Other: Large left parietal and right occipital scalp hematomas. Soft tissue contusion about the left cheek, orbit, and forehead. CT CERVICAL SPINE FINDINGS Alignment: Straightening of the normal cervical lordosis. Skull base and vertebrae: No acute fracture. No primary bone lesion or focal pathologic process. Soft tissues and spinal canal: No prevertebral fluid or swelling. No visible canal hematoma. Disc levels: Mild disc space height loss and osteophytosis of C5 through C7. Upper chest: Negative. Other: None. IMPRESSION: 1. There is subarachnoid hemorrhage about the medial bilateral frontal lobes and falx (series 4, image 23). 2.  No displaced fracture or dislocation of the facial bones. 3. Large left parietal  and right occipital scalp hematomas. Soft tissue contusion about the left cheek, orbit, and forehead. 4.  No fracture or static subluxation of the cervical spine. These results were called by telephone at the time of interpretation on 02/03/2019 at 4:48 pm  to Dr. Chaney MallingAVID YAO , who verbally acknowledged these results. Electronically Signed   By: Lauralyn PrimesAlex  Bibbey M.D.   On: 02/03/2019 16:57   Dg Shoulder Left  Result Date: 02/03/2019 CLINICAL DATA:  Loss consciousness in the shower last evening suffering a fall, now with left shoulder pain and bruising. EXAM: LEFT SHOULDER - 2+ VIEW COMPARISON:  None. FINDINGS: No fracture or dislocation. Glenohumeral joint spaces appear preserved. Mild degenerative change of the left AC joint with joint space loss, subchondral sclerosis and inferiorly directed osteophytosis. No evidence of calcific tendinitis. Limited visualization of the adjacent thorax is normal. Regional soft tissues appear normal. IMPRESSION: 1. No acute findings. 2. Mild degenerative change of the left AC joint. Electronically Signed   By: Simonne ComeJohn  Watts M.D.   On: 02/03/2019 16:48   Ct Maxillofacial Wo Contrast  Result Date: 02/03/2019 CLINICAL DATA:  Fall, head trauma, anticoagulation EXAM: CT HEAD WITHOUT CONTRAST CT MAXILLOFACIAL WITHOUT CONTRAST CT CERVICAL SPINE WITHOUT CONTRAST TECHNIQUE: Multidetector CT imaging of the head, cervical spine, and maxillofacial structures were performed using the standard protocol without intravenous contrast. Multiplanar CT image reconstructions of the cervical spine and maxillofacial structures were also generated. COMPARISON:  None. FINDINGS: CT HEAD FINDINGS Brain: There is subarachnoid hemorrhage about the medial bilateral frontal lobes and falx (series 4, image 23). Vascular: No hyperdense vessel or unexpected calcification. CT FACIAL BONES FINDINGS Skull: Normal. Negative for fracture or focal lesion. Facial bones: No displaced fractures or dislocations. Sinuses/Orbits: No acute finding. Other: Large left parietal and right occipital scalp hematomas. Soft tissue contusion about the left cheek, orbit, and forehead. CT CERVICAL SPINE FINDINGS Alignment: Straightening of the normal cervical lordosis. Skull base and  vertebrae: No acute fracture. No primary bone lesion or focal pathologic process. Soft tissues and spinal canal: No prevertebral fluid or swelling. No visible canal hematoma. Disc levels: Mild disc space height loss and osteophytosis of C5 through C7. Upper chest: Negative. Other: None. IMPRESSION: 1. There is subarachnoid hemorrhage about the medial bilateral frontal lobes and falx (series 4, image 23). 2.  No displaced fracture or dislocation of the facial bones. 3. Large left parietal and right occipital scalp hematomas. Soft tissue contusion about the left cheek, orbit, and forehead. 4.  No fracture or static subluxation of the cervical spine. These results were called by telephone at the time of interpretation on 02/03/2019 at 4:48 pm to Dr. Chaney MallingAVID YAO , who verbally acknowledged these results. Electronically Signed   By: Lauralyn PrimesAlex  Bibbey M.D.   On: 02/03/2019 16:57    Procedures Procedures (including critical care time)  Medications Ordered in ED Medications  desmopressin (DDAVP) 32 mcg in sodium chloride 0.9 % 50 mL IVPB (has no administration in time range)  LORazepam (ATIVAN) injection 2-3 mg (has no administration in time range)  folic acid (FOLVITE) tablet 1 mg (has no administration in time range)  multivitamin with minerals tablet 1 tablet (has no administration in time range)  thiamine (VITAMIN B-1) tablet 100 mg (has no administration in time range)  ondansetron (ZOFRAN) injection 4 mg (4 mg Intravenous Given 02/03/19 1602)  sodium chloride 0.9 % bolus 1,000 mL (1,000 mLs Intravenous New Bag/Given 02/03/19 1603)  sodium chloride 0.9 % bolus 1,000 mL (1,000 mLs Intravenous New  Bag/Given 02/03/19 1804)     Initial Impression / Assessment and Plan / ED Course  I have reviewed the triage vital signs and the nursing notes.  Pertinent labs & imaging results that were available during my care of the patient were reviewed by me and considered in my medical decision making (see chart for  details).       Patient's generalized weakness and lightheadedness could likely be due to dehydration, although other causes, such as anemia, electrolyte abnormalities, alcohol use, dysrhythmia, or malignancy.  Will obtain basic blood work, x-ray of left shoulder and CXR, CT head and C-spine, and provide fluids.  Head CT is significant for subarachnoid hemorrhage.  CMP is significant for hyponatremia to 118 and AKI with creatinine of 1.80.  Since patient's hyponatremia is likely chronic and mildly symptomatic with generalized weakness, will treat with isotonic saline currently.  Patient is also severely dehydrated, so this should improve with isotonic saline.  Will consult with neurosurgery regarding management of his subarachnoid hemorrhage  We will give DDAVP to help with reversal of Brilinta.  Neurosurgery agrees with every hour neuro checks and will consult on patient after he is admitted to the hospital.  Due to the severity of his hyponatremia and concurrent subarachnoid hemorrhage requiring frequent neuro checks, will admit to the ICU.    Final Clinical Impressions(s) / ED Diagnoses   Final diagnoses:  Subarachnoid bleed [facility])  Hyponatremia    ED Discharge Orders    None       Lennox Solders, MD 02/03/19 1910    Charlynne Pander, MD 02/03/19 [phone]

## 2019-02-03 NOTE — Telephone Encounter (Signed)
Pt had appt 02/01/2019 which he cancelled and did not reschedule. Spoke with him yesterday and he sates he has been sick with diarrhea but is better now, made an appt for 02/03/2019 and he no showed that appt. I have called twice this morning and left message that he needs to be seen today or tomorrow or he will not be able to continue working.

## 2019-02-03 NOTE — ED Notes (Signed)
Patient transported to CT 

## 2019-02-03 NOTE — ED Notes (Signed)
ED TO INPATIENT HANDOFF REPORT  ED Nurse Name and Phone #: Magnus IvanLouie, RN [phone]  S Name/Age/Gender Dustin Velazquez [age] y.o. male Room/Bed: 026C/026C  Code Status   Code Status: Prior  Home/SNF/Other Home Patient oriented to: situation Is this baseline? No   Triage Complete: Triage complete  Chief Complaint Fall  Triage Note Pt c/o fall last night; stated fell forward coming out of the shower ad hit face on toilet seat; +LOC; reported fell again this d/t LOC; reported on Brilinta for heart stents. C/o dizziness and aches and pain everywhere along w/ nausea. Denies ETOH. Reported last alcohol intake has been a while.  Reported last alcohol intake is about "3 weeks or a month, something like that"   Allergies Allergies  Allergen Reactions  . Compazine [Prochlorperazine Edisylate] Other (See Comments)    seizure    Level of Care/Admitting Diagnosis ED Disposition    ED Disposition Condition Comment   Admit  The patient appears reasonably stabilized for admission considering the current resources, flow, and capabilities available in the ED at this time, and I doubt any other [facility] requiring further screening and/or treatment in the ED prior to admission is  present.       B Medical/Surgery History Past Medical History:  Diagnosis Date  . ETOH abuse   . GERD (gastroesophageal reflux disease)   . Hypercholesteremia   . Hypertension   . Sleep walking disorder    Past Surgical History:  Procedure Laterality Date  . APPENDECTOMY    . CORONARY STENT INTERVENTION N/A 12/30/2016   Procedure: Coronary Stent Intervention;  Surgeon: Iran OuchArida, Muhammad A, MD;  Location: [facility] INVASIVE CV LAB;  Service: Cardiovascular;  Laterality: N/A;  . KNEE SURGERY    . LEFT HEART CATH AND CORONARY ANGIOGRAPHY N/A 12/30/2016   Procedure: Left Heart Cath and Coronary Angiography;  Surgeon: Iran OuchArida, Muhammad A, MD;  Location: [facility] INVASIVE CV LAB;  Service: Cardiovascular;  Laterality: N/A;      A IV Location/Drains/Wounds Patient Lines/Drains/Airways Status   Active Line/Drains/Airways    Name:   Placement date:   Placement time:   Site:   Days:   Peripheral IV 02/03/19 Left Antecubital   02/03/19    1529    Antecubital   less than 1          Intake/Output Last 24 hours  Intake/Output Summary (Last 24 hours) at 02/03/2019 1756 Last data filed at 02/03/2019 1531 Gross per 24 hour  Intake 0 ml  Output 0 ml  Net 0 ml    Labs/Imaging Results for orders placed or performed during the hospital encounter of 02/03/19 (from the past 48 hour(s))  Ethanol     Status: None   Collection Time: 02/03/19  3:22 PM  Result Value Ref Range   Alcohol, Ethyl (B) <10 <10 mg/dL    Comment: (NOTE) Lowest detectable limit for serum alcohol is 10 mg/dL. For medical purposes only. Performed at [facility], [address] [place], [place] [address]   CBC with Differential     Status: Abnormal   Collection Time: 02/03/19  3:30 PM  Result Value Ref Range   WBC 12.5 (H) 4.0 - 10.5 K/uL   RBC 3.44 (L) 4.22 - 5.81 MIL/uL   Hemoglobin 11.3 (L) 13.0 - 17.0 g/dL   HCT 78.231.6 (L) 95.639.0 - 21.352.0 %   MCV 91.9 80.0 - 100.0 fL   MCH 32.8 26.0 - 34.0 pg   MCHC 35.8 30.0 - 36.0 g/dL  RDW 12.7 11.5 - 15.5 %   Platelets 221 150 - 400 K/uL   nRBC 0.0 0.0 - 0.2 %   Neutrophils Relative % 87 %   Neutro Abs 11.0 (H) 1.7 - 7.7 K/uL   Lymphocytes Relative 2 %   Lymphs Abs 0.3 (L) 0.7 - 4.0 K/uL   Monocytes Relative 10 %   Monocytes Absolute 1.2 (H) 0.1 - 1.0 K/uL   Eosinophils Relative 0 %   Eosinophils Absolute 0.0 0.0 - 0.5 K/uL   Basophils Relative 0 %   Basophils Absolute 0.0 0.0 - 0.1 K/uL   Immature Granulocytes 1 %   Abs Immature Granulocytes 0.06 0.00 - 0.07 K/uL    Comment: Performed at [facility], [address] [place], [place] [address]  Comprehensive metabolic panel     Status: Abnormal   Collection Time: 02/03/19  3:30 PM  Result Value Ref Range   Sodium 118 (LL) 135  - 145 mmol/L    Comment: CRITICAL RESULT CALLED TO, READ BACK BY AND VERIFIED WITH: L Zakia Sainato,RN 1631 02/03/2019 D BRADLEY    Potassium 4.1 3.5 - 5.1 mmol/L   Chloride 75 (L) 98 - 111 mmol/L   CO2 20 (L) 22 - 32 mmol/L   Glucose, Bld 138 (H) 70 - 99 mg/dL   BUN 21 (H) 6 - 20 mg/dL   Creatinine, Ser 1.911.80 (H) 0.61 - 1.24 mg/dL   Calcium 8.6 (L) 8.9 - 10.3 mg/dL   Total Protein 6.3 (L) 6.5 - 8.1 g/dL   Albumin 3.6 3.5 - 5.0 g/dL   AST 478143 (H) 15 - 41 U/L   ALT 116 (H) 0 - 44 U/L   Alkaline Phosphatase 65 38 - 126 U/L   Total Bilirubin 1.9 (H) 0.3 - 1.2 mg/dL   GFR calc non Af Amer 41 (L) >60 mL/min   GFR calc Af Amer 47 (L) >60 mL/min   Anion gap 23 (H) 5 - 15    Comment: Performed at [facility], [address] [place], [place] [address]  Troponin I (High Sensitivity)     Status: None   Collection Time: 02/03/19  3:30 PM  Result Value Ref Range   Troponin I (High Sensitivity) 14 <18 ng/L    Comment: (NOTE) Elevated high sensitivity troponin I (hsTnI) values and significant  changes across serial measurements may suggest ACS but many other  chronic and acute conditions are known to elevate hsTnI results.  Refer to the Links section for chest pain algorithms and additional  guidance. Performed at [facility], [address] [place], [place] [address]   Protime-INR     Status: None   Collection Time: 02/03/19  5:00 PM  Result Value Ref Range   Prothrombin Time 12.8 11.4 - 15.2 seconds   INR 1.0 0.8 - 1.2    Comment: (NOTE) INR goal varies based on device and disease states. Performed at [facility], [address] [place], [place] [address]    Dg Chest 2 View  Result Date: 02/03/2019 CLINICAL DATA:  Syncopal episode. EXAM: CHEST - 2 VIEW COMPARISON:  01/03/2019 FINDINGS: Unchanged cardiac silhouette and mediastinal contours. Retrocardiac air and fluid containing structure is unchanged compatible with a hiatal hernia. Grossly unchanged bilateral  infrahilar heterogeneous opacities favored to represent atelectasis. No discrete focal airspace opacities. No pleural effusion or pneumothorax. No evidence of edema. No acute osseous abnormalities. Stigmata of DISH within the lower thoracic spine. IMPRESSION: 1.  No  acute cardiopulmonary disease. 2. Hiatal hernia, unchanged. Electronically Signed   By: Simonne ComeJohn  Watts M.D.   On: 02/03/2019 16:50   Ct Head Wo Contrast  Result Date: 02/03/2019 CLINICAL DATA:  Fall, head trauma, anticoagulation EXAM: CT HEAD WITHOUT CONTRAST CT MAXILLOFACIAL WITHOUT CONTRAST CT CERVICAL SPINE WITHOUT CONTRAST TECHNIQUE: Multidetector CT imaging of the head, cervical spine, and maxillofacial structures were performed using the standard protocol without intravenous contrast. Multiplanar CT image reconstructions of the cervical spine and maxillofacial structures were also generated. COMPARISON:  None. FINDINGS: CT HEAD FINDINGS Brain: There is subarachnoid hemorrhage about the medial bilateral frontal lobes and falx (series 4, image 23). Vascular: No hyperdense vessel or unexpected calcification. CT FACIAL BONES FINDINGS Skull: Normal. Negative for fracture or focal lesion. Facial bones: No displaced fractures or dislocations. Sinuses/Orbits: No acute finding. Other: Large left parietal and right occipital scalp hematomas. Soft tissue contusion about the left cheek, orbit, and forehead. CT CERVICAL SPINE FINDINGS Alignment: Straightening of the normal cervical lordosis. Skull base and vertebrae: No acute fracture. No primary bone lesion or focal pathologic process. Soft tissues and spinal canal: No prevertebral fluid or swelling. No visible canal hematoma. Disc levels: Mild disc space height loss and osteophytosis of C5 through C7. Upper chest: Negative. Other: None. IMPRESSION: 1. There is subarachnoid hemorrhage about the medial bilateral frontal lobes and falx (series 4, image 23). 2.  No displaced fracture or dislocation of the facial  bones. 3. Large left parietal and right occipital scalp hematomas. Soft tissue contusion about the left cheek, orbit, and forehead. 4.  No fracture or static subluxation of the cervical spine. These results were called by telephone at the time of interpretation on 02/03/2019 at 4:48 pm to Dr. Chaney MallingAVID YAO , who verbally acknowledged these results. Electronically Signed   By: Lauralyn PrimesAlex  Bibbey M.D.   On: 02/03/2019 16:57   Ct Cervical Spine Wo Contrast  Result Date: 02/03/2019 CLINICAL DATA:  Fall, head trauma, anticoagulation EXAM: CT HEAD WITHOUT CONTRAST CT MAXILLOFACIAL WITHOUT CONTRAST CT CERVICAL SPINE WITHOUT CONTRAST TECHNIQUE: Multidetector CT imaging of the head, cervical spine, and maxillofacial structures were performed using the standard protocol without intravenous contrast. Multiplanar CT image reconstructions of the cervical spine and maxillofacial structures were also generated. COMPARISON:  None. FINDINGS: CT HEAD FINDINGS Brain: There is subarachnoid hemorrhage about the medial bilateral frontal lobes and falx (series 4, image 23). Vascular: No hyperdense vessel or unexpected calcification. CT FACIAL BONES FINDINGS Skull: Normal. Negative for fracture or focal lesion. Facial bones: No displaced fractures or dislocations. Sinuses/Orbits: No acute finding. Other: Large left parietal and right occipital scalp hematomas. Soft tissue contusion about the left cheek, orbit, and forehead. CT CERVICAL SPINE FINDINGS Alignment: Straightening of the normal cervical lordosis. Skull base and vertebrae: No acute fracture. No primary bone lesion or focal pathologic process. Soft tissues and spinal canal: No prevertebral fluid or swelling. No visible canal hematoma. Disc levels: Mild disc space height loss and osteophytosis of C5 through C7. Upper chest: Negative. Other: None. IMPRESSION: 1. There is subarachnoid hemorrhage about the medial bilateral frontal lobes and falx (series 4, image 23). 2.  No displaced  fracture or dislocation of the facial bones. 3. Large left parietal and right occipital scalp hematomas. Soft tissue contusion about the left cheek, orbit, and forehead. 4.  No fracture or static subluxation of the cervical spine. These results were called by telephone at the time of interpretation on 02/03/2019 at 4:48 pm to Dr. Chaney MallingAVID YAO , who verbally acknowledged  these results. Electronically Signed   By: Lauralyn Primes M.D.   On: 02/03/2019 16:57   Dg Shoulder Left  Result Date: 02/03/2019 CLINICAL DATA:  Loss consciousness in the shower last evening suffering a fall, now with left shoulder pain and bruising. EXAM: LEFT SHOULDER - 2+ VIEW COMPARISON:  None. FINDINGS: No fracture or dislocation. Glenohumeral joint spaces appear preserved. Mild degenerative change of the left AC joint with joint space loss, subchondral sclerosis and inferiorly directed osteophytosis. No evidence of calcific tendinitis. Limited visualization of the adjacent thorax is normal. Regional soft tissues appear normal. IMPRESSION: 1. No acute findings. 2. Mild degenerative change of the left AC joint. Electronically Signed   By: Simonne Come M.D.   On: 02/03/2019 16:48   Ct Maxillofacial Wo Contrast  Result Date: 02/03/2019 CLINICAL DATA:  Fall, head trauma, anticoagulation EXAM: CT HEAD WITHOUT CONTRAST CT MAXILLOFACIAL WITHOUT CONTRAST CT CERVICAL SPINE WITHOUT CONTRAST TECHNIQUE: Multidetector CT imaging of the head, cervical spine, and maxillofacial structures were performed using the standard protocol without intravenous contrast. Multiplanar CT image reconstructions of the cervical spine and maxillofacial structures were also generated. COMPARISON:  None. FINDINGS: CT HEAD FINDINGS Brain: There is subarachnoid hemorrhage about the medial bilateral frontal lobes and falx (series 4, image 23). Vascular: No hyperdense vessel or unexpected calcification. CT FACIAL BONES FINDINGS Skull: Normal. Negative for fracture or focal lesion.  Facial bones: No displaced fractures or dislocations. Sinuses/Orbits: No acute finding. Other: Large left parietal and right occipital scalp hematomas. Soft tissue contusion about the left cheek, orbit, and forehead. CT CERVICAL SPINE FINDINGS Alignment: Straightening of the normal cervical lordosis. Skull base and vertebrae: No acute fracture. No primary bone lesion or focal pathologic process. Soft tissues and spinal canal: No prevertebral fluid or swelling. No visible canal hematoma. Disc levels: Mild disc space height loss and osteophytosis of C5 through C7. Upper chest: Negative. Other: None. IMPRESSION: 1. There is subarachnoid hemorrhage about the medial bilateral frontal lobes and falx (series 4, image 23). 2.  No displaced fracture or dislocation of the facial bones. 3. Large left parietal and right occipital scalp hematomas. Soft tissue contusion about the left cheek, orbit, and forehead. 4.  No fracture or static subluxation of the cervical spine. These results were called by telephone at the time of interpretation on 02/03/2019 at 4:48 pm to Dr. Chaney Malling , who verbally acknowledged these results. Electronically Signed   By: Lauralyn Primes M.D.   On: 02/03/2019 16:57    Pending Labs Unresulted Labs (From admission, onward)    Start     Ordered   02/03/19 1725  SARS Coronavirus 2 (CEPHEID - Performed in [facility] hospital lab), Hosp Order  (Asymptomatic Patients Labs)  Once,   STAT    Question:  Rule Out  Answer:  Yes   02/03/19 1724   02/03/19 1721  Basic metabolic panel  ONCE - STAT,   STAT     02/03/19 1720   02/03/19 1650  Osmolality, urine  Once,   STAT     02/03/19 1650   02/03/19 1650  Osmolality  ONCE - STAT,   STAT     02/03/19 1650   02/03/19 1649  TSH  Add-on,   AD     02/03/19 1648   02/03/19 1642  Sodium, urine, random  Once,   STAT     02/03/19 1642          Vitals/Pain Today's Vitals   02/03/19 1522 02/03/19 1530 02/03/19 1545  02/03/19 1604  BP:  136/71 (!) 105/95  137/76  Pulse:  (!) 101 95 (!) 105  Resp:  (!) 21 (!) 23 18  Temp:      TempSrc:      SpO2:  100% 99% 98%  Weight: 79.8 kg     Height: 5\' 9"  (1.753 m)     PainSc: 4        Isolation Precautions No active isolations  Medications Medications  sodium chloride 0.9 % bolus 1,000 mL (has no administration in time range)  desmopressin (DDAVP) 32 mcg in sodium chloride 0.9 % 50 mL IVPB (has no administration in time range)  ondansetron (ZOFRAN) injection 4 mg (4 mg Intravenous Given 02/03/19 1602)  sodium chloride 0.9 % bolus 1,000 mL (1,000 mLs Intravenous New Bag/Given 02/03/19 1603)    Mobility walks with person assist High fall risk   Focused Assessments Cardiac Assessment Handoff:  Cardiac Rhythm: Normal sinus rhythm Lab Results  Component Value Date   TROPONINI <0.03 12/17/2018   No results found for: DDIMER Does the Patient currently have chest pain? No  , Neuro Assessment Handoff:  Swallow screen pass? No  Cardiac Rhythm: Normal sinus rhythm       Neuro Assessment:   Neuro Checks:      Last Documented NIHSS Modified Score:   Has TPA been given? No If patient is a Neuro Trauma and patient is going to OR before floor call report to [place] nurse: [phone] or [phone]     R Recommendations: See Admitting Provider Note  Report given to:   Additional Notes: Here d/t recurring falls; +subarrachnoid bleed; on Brilinta, a&ox4. Na. 118; 2L NS bolus + desmopressin

## 2019-02-03 NOTE — H&P (Addendum)
NAME:  Raoul PitchMichael L Michalsky, MRN:  [id number], DOB:  1961/03/11, LOS: 0 ADMISSION DATE:  02/03/2019, CONSULTATION DATE:  02/03/2019 REFERRING MD:  Silverio LayYao - EM, CHIEF COMPLAINT:  Fall. SAH, Hyponatremia  Brief History   58 yo M EtOH abuse, found to be hyponatremic 118 and have SAH.  Reportedly, NSGY has said non-op and requested q1hr neuro checks, PCCM asked to admit as q1 neuro checks are only in ICU. At this time, no note from NSY present   History of present illness   History obtained from chart   58 yo M PMH EtOH abuse (last relapse 7/4), tobacco use, GERD, HTN, HLD, MDD, NSTEMI with stent placement (on Brilinta?), who presents 7/29 after sustaining a fall 7/28 at night. The patient was reportedly exiting shower, fell and hit head on toilet seat. Patient endorses + LOC. Patient reportedly fell again due to dizziness/LOC. Patient states he takes Brilinta for heart stents. Patient denies recent EtOH use, citing last use approx 3 weeks ago. Of note, patient discharged from ED with short course of Librium on 7/5, following by Dr. Dorris FetchHendrickson. Appears patient has missed follow up appointments the past several days, with telephone encounter notes citing nausea, diarrhea.  Depression reportedly worse in setting of marital separation approx 6 months ago. Patient endorses weight loss and poor appetite. Patient denies SOB, chest pain, night sweats.  In ED patient taken for CT head, maxillofacial, and Cspine. CT head reveals SAH. CT Mf and Cspine unremarkable. ED reportedly has consulted NSGY and patient is deemed not surgical candidate. NSGY requesting q1hr neurochecks. PCCM to admit.   Other significant ED findings include Na 118 and Cr 1.8. DDAVP given in ED for Brilinta, and isotonic saline started for chronic hyponatremia.    Past Medical History  EtOH abuse (last reported use 7/4, Librium initiated 7/5)  NSTEMI with stent placement, on Brilinta HTN MDD Anxiety Tobacco use disorder  HLD     Significant Hospital Events   7/29 presents to ED. Admitted to ICU  Consults:  NSGY  Procedures:    Significant Diagnostic Tests:  7/29 CT Head non con> SAH of medial bilateral frontal lobes. L Parietal R occipital hematomas.  7/29 CT maxillofacial, C spine non con> No acute skull base or facial bone fracture. Mild disc space height loss C5-C7. No fracture or sublax of cervical spine.  7/29 XR L shoulder> no acute fracture or dislocation Micro Data:  7/29 SARS CoV2 > negative    Antimicrobials:    Interim history/subjective:  COVID 19 result is negative  Patient HDS, comfortable on RA, neurologically intact in ED   Objective   Blood pressure 137/84, pulse (!) 101, temperature 98 F (36.7 C), temperature source Oral, resp. rate 19, height 5\' 9"  (1.753 m), weight 79.8 kg, SpO2 98 %.        Intake/Output Summary (Last 24 hours) at 02/03/2019 1816 Last data filed at 02/03/2019 1531 Gross per 24 hour  Intake 0 ml  Output 0 ml  Net 0 ml   Filed Weights   02/03/19 1522  Weight: 79.8 kg    Examination: General: Chronically ill appearing male, appears older than stated age, numerous areas of bruising and trauma on head  HENT: [place]. Ecchymosis L eye with periorbital swelling. dried blood on face. Scalp hematoma. Pink mmm.  Lungs: CTA B. Symmetrical chest expansion. No accessory muscle use Cardiovascular: RRR s1s2 no rgm  Abdomen: ndnt, soft, +bowel sounds  Extremities: L shoulder ecchymosis.  No cyanosis, no clubbing, capillary  refill < 3 sec BUE BLE Neuro: AAO x4. PERRL 85mm. 5/5 BUE BLE strength. No focal deficits GU: due to void   Resolved Hospital Problem list     Assessment & Plan:  This is a 58 yo M with h/o alcohol dependence and abuse, on Brilinta and ASA s/p cardiac stenting (2018) who sustained a fall, striking his head now with a SDH, hyponatremia and AKI for admission to ICU for close monitoring and Na correction.   SDH 2/2 fall in setting of Brilinta/ASA use.  Neurosurgery consulted. No surgical intervention for now. Pt received DDAVP 0.52mck/kg in ED. Plan Neuro checks HOB elevated Maintain euglycemia, normothermia Repeat head CT in am, sooner if neurological changes He is on naltrexone chronically-hold  Per EMR review, visit with cardiology Lorine Bears, MD) 03/05/18 plan was to continue Brilinta for one year then stop. Will hold Brilinta, ASA and contact same 7/30 to confirm.   Hyponatremia, hypotonic-likely acute on chronic, c/b dehydration. DDAVP in ED of some secondary benefit.  Plan Continue IVF Q4H Na checks Avoid rapid correction Hold HCTZ and metoprolol  AKI Plan IVF Avoid nephrotoxins F/U renal panel  Hold home ACE-I, HCTZ  for now   H/O Alcohol dependence and abuse. Last reported intake January 09, 2019. On 7/5 started short course librium Plan CIWA, stepdown orderset Monitor for SnSx withdrawal Thiamine, MVI, folic acid   Best practice:  Diet: sips with meds Pain/Anxiety/Delirium protocol (if indicated): CIWA ordered, however may not need VAP protocol (if indicated):Not indicated DVT prophylaxis: SCDs only in setting of SAH GI prophylaxis: continue home PPI  Glucose control: SSI Mobility: Bedrest  Code Status: FULL  Family Communication: Will update  Disposition: Admit ICU   Labs   CBC: Last Labs      Recent Labs  Lab 02/03/19 1530  WBC 12.5*  NEUTROABS 11.0*  HGB 11.3*  HCT 31.6*  MCV 91.9  PLT 221      Basic Metabolic Panel: Last Labs      Recent Labs  Lab 02/03/19 1530  NA 118*  K 4.1  CL 75*  CO2 20*  GLUCOSE 138*  BUN 21*  CREATININE 1.80*  CALCIUM 8.6*     GFR: Estimated Creatinine Clearance: 44.7 mL/min (A) (by C-G formula based on SCr of 1.8 mg/dL (H)). Last Labs   Recent Labs  Lab 02/03/19 1530  WBC 12.5*      Liver Function Tests: Last Labs      Recent Labs  Lab 02/03/19 1530  AST 143*  ALT 116*  ALKPHOS 65  BILITOT 1.9*  PROT 6.3*  ALBUMIN 3.6      Last Labs   No results for input(s): LIPASE, AMYLASE in the last 168 hours.   Last Labs   No results for input(s): AMMONIA in the last 168 hours.    ABG Labs (Brief)  No results found for: PHART, PCO2ART, PO2ART, HCO3, TCO2, ACIDBASEDEF, O2SAT     Coagulation Profile: Last Labs      Recent Labs  Lab 02/03/19 1700  INR 1.0      Cardiac Enzymes: Last Labs   No results for input(s): CKTOTAL, CKMB, CKMBINDEX, TROPONINI in the last 168 hours.    HbA1C: Last Labs  No results found for: HGBA1C    CBG: Last Labs   No results for input(s): GLUCAP in the last 168 hours.    Review of Systems:   Review of Systems  Constitutional: Positive for weight loss.  HENT: Negative.   Eyes: Negative.  Respiratory: Negative.   Cardiovascular: Negative.   Gastrointestinal: Positive for diarrhea and nausea.  Genitourinary: Negative.   Musculoskeletal: Positive for falls.  Skin: Negative.   Neurological: Positive for dizziness, weakness and headaches.  Endo/Heme/Allergies: Bruises/bleeds easily.  Psychiatric/Behavioral: Positive for depression. The patient is nervous/anxious.        Has had SI in past but not current      Past Medical History  He,  has a past medical history of ETOH abuse, GERD (gastroesophageal reflux disease), Hypercholesteremia, Hypertension, and Sleep walking disorder.   Surgical History    Past Surgical History:  Procedure Laterality Date  . APPENDECTOMY    . CORONARY STENT INTERVENTION N/A 12/30/2016   Procedure: Coronary Stent Intervention;  Surgeon: Iran OuchArida, Muhammad A, MD;  Location: [facility] INVASIVE CV LAB;  Service: Cardiovascular;  Laterality: N/A;  . KNEE SURGERY    . LEFT HEART CATH AND CORONARY ANGIOGRAPHY N/A 12/30/2016   Procedure: Left Heart Cath and Coronary Angiography;  Surgeon: Iran OuchArida, Muhammad A, MD;  Location: [facility] INVASIVE CV LAB;  Service: Cardiovascular;  Laterality: N/A;     Social History   reports that he has been smoking  cigarettes. He has a 7.50 pack-year smoking history. He has never used smokeless tobacco. He reports current alcohol use of about 8.0 standard drinks of alcohol per week. He reports that he does not use drugs.   Family History   His family history includes Hyperlipidemia in his father; Hypertension in his mother.   Allergies Allergies  Allergen Reactions  . Compazine [Prochlorperazine Edisylate] Other (See Comments)    Seizures      Home Medications  Prior to Admission medications   Medication Sig Start Date End Date Taking? Authorizing Provider  aspirin EC 81 MG tablet Take 1 tablet (81 mg total) by mouth daily. 12/16/18   Clapacs, Jackquline DenmarkJohn T, MD  hydrochlorothiazide (HYDRODIURIL) 25 MG tablet Take 1 tablet (25 mg total) by mouth daily. 01/25/19   Jamelle HaringHendrickson, Clifford D, MD  lisinopril (ZESTRIL) 40 MG tablet Take 1 tablet (40 mg total) by mouth daily. 12/16/18   Clapacs, Jackquline DenmarkJohn T, MD  metoprolol tartrate (LOPRESSOR) 50 MG tablet Take 1 tablet (50 mg total) by mouth 2 (two) times daily. 12/16/18   Clapacs, Jackquline DenmarkJohn T, MD  naltrexone (DEPADE) 50 MG tablet Take 50 mg by mouth daily.  12/17/18   [provider]  omeprazole (PRILOSEC) 20 MG capsule Take 20 mg by mouth 2 (two) times a day. 01/18/19   [provider]  pantoprazole (PROTONIX) 40 MG tablet Take 1 tablet (40 mg total) by mouth daily. 12/17/18   Clapacs, Jackquline DenmarkJohn T, MD  rosuvastatin (CRESTOR) 40 MG tablet Take 1 tablet (40 mg total) by mouth daily. Patient taking differently: Take 40 mg by mouth every evening.  12/16/18 03/16/19  Clapacs, Jackquline DenmarkJohn T, MD  sertraline (ZOLOFT) 50 MG tablet Take 50 mg by mouth daily.  12/21/18   [provider]  ticagrelor (BRILINTA) 60 MG TABS tablet Take 1 tablet (60 mg total) by mouth 2 (two) times daily. 12/16/18   Clapacs, Jackquline DenmarkJohn T, MD     Critical care time: 40 min     Tessie FassGrace Bowser MSN, AGACNP-BC [place] [facility] [phone] If no answer, [phone] 02/03/2019, 7:01 PM   Attending note:  58 year old alcoholic male who quit alcohol on 7/4.  Fell on the morning of presentation and started developing dizziness and nausea after the fall and reported to the ER.  In the ER the patient  was noted to be hyponatremic and a CT of the head that I reviewed myself showed a small SAH.  On exam, lungs are clear, large abrasion over the left eye brow that the EDP addressed and ecchymosis over the left shoulder with a negative X-ray of that.  Discussed with PCCM-NP.  Neurosurgery called by EDP, recommended q1 neuro checks.  Will admit to the ICU.  D/C IVF for now.  Check BMET now since patient got both DDAVP and NS unknown amount.  Will adjust IVF accordingly.  CIWA.  The patient is critically ill with multiple organ systems failure and requires high complexity decision making for assessment and support, frequent evaluation and titration of therapies, application of advanced monitoring technologies and extensive interpretation of multiple databases.   Critical Care Time devoted to patient care services described in this note is  35  Minutes. This time reflects time of care of this signee Dr Jennet Maduro. This critical care time does not reflect procedure time, or teaching time or supervisory time of PA/NP/Med student/Med Resident etc but could involve care discussion time.  Rush Farmer, M.D. [facility]/Critical Care Medicine. Pager: [phone]. After hours pager: [phone].

## 2019-02-03 NOTE — Telephone Encounter (Signed)
Dustin Velazquez this morning to notify the patient that he needs to follow-up with me by tomorrow, or he will not be able to continue working. He was a no-show Monday and was called, rescheduled for this morning and a no-show again.  The importance of compliance with appointments was noted to him when he was returned to work, and that he could be removed from work if he was not compliant.

## 2019-02-03 NOTE — ED Triage Notes (Signed)
Reported last alcohol intake is about "3 weeks or a month, something like that"

## 2019-02-03 NOTE — ED Notes (Signed)
Pt stated while doing orthostatic vital signs that he could not stand. He would pass out.

## 2019-02-03 NOTE — ED Triage Notes (Signed)
Pt c/o fall last night; stated fell forward coming out of the shower ad hit face on toilet seat; +LOC; reported fell again this d/t LOC; reported on Brilinta for heart stents. C/o dizziness and aches and pain everywhere along w/ nausea. Denies ETOH. Reported last alcohol intake has been a while.

## 2019-02-03 NOTE — Consult Note (Signed)
Reason for Consult: Minimal traumatic subarachnoid hemorrhage Referring Physician: Dr. Silverio LayYao (EDP)  Dustin Velazquez is an 58 y.o. male.  HPI: Middle-age male who apparently fell 2 nights ago at home when he was stepping out of the shower, and he fell forward on his face.  He has felt poorly since and presented to the [facility] emergency room for evaluation.  Work-up revealed a profound hyponatremia (sodium 119) mild renal insufficiency (BUN/creatinine 19/1.59), and mild leukocytosis (WBC 12.5).  CT of the brain was done which showed minimal prefrontal and interhemispheric traumatic subarachnoid hemorrhage.  Notably the patient is on Brilinta.  He was given DDAVP to reverse the effects of the Brilinta by Dr. Silverio LayYao.  Patient is being admitted by the critical care medicine service.  Neurosurgical consultation was requested regarding the minimal traumatic subarachnoid hemorrhage.  Past Medical History:  Past Medical History:  Diagnosis Date  . ETOH abuse   . GERD (gastroesophageal reflux disease)   . Hypercholesteremia   . Hypertension   . Sleep walking disorder     Past Surgical History:  Past Surgical History:  Procedure Laterality Date  . APPENDECTOMY    . CORONARY STENT INTERVENTION N/A 12/30/2016   Procedure: Coronary Stent Intervention;  Surgeon: Iran OuchArida, Muhammad A, MD;  Location: [facility] INVASIVE CV LAB;  Service: Cardiovascular;  Laterality: N/A;  . KNEE SURGERY    . LEFT HEART CATH AND CORONARY ANGIOGRAPHY N/A 12/30/2016   Procedure: Left Heart Cath and Coronary Angiography;  Surgeon: Iran OuchArida, Muhammad A, MD;  Location: [facility] INVASIVE CV LAB;  Service: Cardiovascular;  Laterality: N/A;    Family History:  Family History  Problem Relation Age of Onset  . Hypertension Mother   . Hyperlipidemia Father     Social History:  reports that he has been smoking cigarettes. He has a 7.50 pack-year smoking history. He has never used smokeless tobacco. He reports current alcohol use of about  8.0 standard drinks of alcohol per week. He reports that he does not use drugs.  Allergies:  Allergies  Allergen Reactions  . Compazine [Prochlorperazine Edisylate] Other (See Comments)    Seizures     Medications: I have reviewed the patient's current medications.  ROS : Notable for those difficult described in his history of present illness and past medical history, but is otherwise unremarkable.  Physical Examination: Middle-aged male resting comfortably in a ER stretcher, in no acute distress.  Mild bruising around the left eye. Blood pressure 137/84, pulse (!) 101, temperature 98 F (36.7 C), temperature source Oral, resp. rate 19, height 5\' 9"  (1.753 m), weight 79.8 kg, SpO2 98 %.  Neurological Examination: Mental Status Examination: Awake and alert, oriented to name, [facility], and July 2020.  Following commands.  Speech is fluent. Cranial Nerve Examination: Pupils are 3 mm bilaterally, round, reactive to light.  EOMI.  Facial sensation intact.  Facial movement symmetrical.  Hearing present.  Palatal movement symmetrical.  Shoulder shrug symmetrical.  Tongue midline. Motor Examination: 5/5 strength in upper and lower extremities.  No drift of upper extremities. Sensory Examination: Intact to pinprick throughout. Reflex Examination:   Diminished, but symmetrical throughout. Gait and Stance Examination: Not tested due to the nature the patient's condition.   Results for orders placed or performed during the hospital encounter of 02/03/19 (from the past 48 hour(s))  Ethanol     Status: None   Collection Time: 02/03/19  3:22 PM  Result Value Ref Range   Alcohol, Ethyl (B) <10 <10 mg/dL  Comment: (NOTE) Lowest detectable limit for serum alcohol is 10 mg/dL. For medical purposes only. Performed at [facility], [address], [place], [place] [address]   CBC with Differential     Status: Abnormal   Collection Time: 02/03/19  3:30 PM  Result Value Ref Range    WBC 12.5 (H) 4.0 - 10.5 K/uL   RBC 3.44 (L) 4.22 - 5.81 MIL/uL   Hemoglobin 11.3 (L) 13.0 - 17.0 g/dL   HCT 22.2 (L) 97.9 - 89.2 %   MCV 91.9 80.0 - 100.0 fL   MCH 32.8 26.0 - 34.0 pg   MCHC 35.8 30.0 - 36.0 g/dL   RDW 11.9 41.7 - 40.8 %   Platelets 221 150 - 400 K/uL   nRBC 0.0 0.0 - 0.2 %   Neutrophils Relative % 87 %   Neutro Abs 11.0 (H) 1.7 - 7.7 K/uL   Lymphocytes Relative 2 %   Lymphs Abs 0.3 (L) 0.7 - 4.0 K/uL   Monocytes Relative 10 %   Monocytes Absolute 1.2 (H) 0.1 - 1.0 K/uL   Eosinophils Relative 0 %   Eosinophils Absolute 0.0 0.0 - 0.5 K/uL   Basophils Relative 0 %   Basophils Absolute 0.0 0.0 - 0.1 K/uL   Immature Granulocytes 1 %   Abs Immature Granulocytes 0.06 0.00 - 0.07 K/uL    Comment: Performed at [facility], [address], [place], [place] [address]  Comprehensive metabolic panel     Status: Abnormal   Collection Time: 02/03/19  3:30 PM  Result Value Ref Range   Sodium 118 (LL) 135 - 145 mmol/L    Comment: CRITICAL RESULT CALLED TO, READ BACK BY AND VERIFIED WITH: L CHILTON,RN 1631 02/03/2019 D BRADLEY    Potassium 4.1 3.5 - 5.1 mmol/L   Chloride 75 (L) 98 - 111 mmol/L   CO2 20 (L) 22 - 32 mmol/L   Glucose, Bld 138 (H) 70 - 99 mg/dL   BUN 21 (H) 6 - 20 mg/dL   Creatinine, Ser 8.56 (H) 0.61 - 1.24 mg/dL   Calcium 8.6 (L) 8.9 - 10.3 mg/dL   Total Protein 6.3 (L) 6.5 - 8.1 g/dL   Albumin 3.6 3.5 - 5.0 g/dL   AST 314 (H) 15 - 41 U/L   ALT 116 (H) 0 - 44 U/L   Alkaline Phosphatase 65 38 - 126 U/L   Total Bilirubin 1.9 (H) 0.3 - 1.2 mg/dL   GFR calc non Af Amer 41 (L) >60 mL/min   GFR calc Af Amer 47 (L) >60 mL/min   Anion gap 23 (H) 5 - 15    Comment: Performed at [facility], [address] [place], [place] [address]  Troponin I (High Sensitivity)     Status: None   Collection Time: 02/03/19  3:30 PM  Result Value Ref Range   Troponin I (High Sensitivity) 14 <18 ng/L    Comment: (NOTE) Elevated high sensitivity troponin I  (hsTnI) values and significant  changes across serial measurements may suggest ACS but many other  chronic and acute conditions are known to elevate hsTnI results.  Refer to the Links section for chest pain algorithms and additional  guidance. Performed at [facility], [address] [place], [place] [address]   TSH     Status: None   Collection Time: 02/03/19  5:00 PM  Result Value Ref Range   TSH 0.359 0.350 - 4.500 uIU/mL    Comment: Performed  by a 3rd Generation assay with a functional sensitivity of <=0.01 uIU/mL. Performed at [facility], [address] [place], [place] [address]   Osmolality     Status: Abnormal   Collection Time: 02/03/19  5:00 PM  Result Value Ref Range   Osmolality 251 (L) 275 - 295 mOsm/kg    Comment: Performed at [facility], [address] [place], [place] [address]  Protime-INR     Status: None   Collection Time: 02/03/19  5:00 PM  Result Value Ref Range   Prothrombin Time 12.8 11.4 - 15.2 seconds   INR 1.0 0.8 - 1.2    Comment: (NOTE) INR goal varies based on device and disease states. Performed at [facility], [address] [place], [place] [address]   Sodium, urine, random     Status: None   Collection Time: 02/03/19  5:15 PM  Result Value Ref Range   Sodium, Ur 27 mmol/L    Comment: Performed at [facility], [address] [place], [place] [address]  Osmolality, urine     Status: None   Collection Time: 02/03/19  5:20 PM  Result Value Ref Range   Osmolality, Ur 454 300 - 900 mOsm/kg    Comment: Performed at [facility], [address] [place], [place] [address]  SARS Coronavirus 2 (CEPHEID - Performed in [facility] hospital lab), Hosp Order     Status: None   Collection Time: 02/03/19  5:22 PM   Specimen: Nasopharyngeal Swab  Result Value Ref Range   SARS Coronavirus 2 NEGATIVE NEGATIVE    Comment: (NOTE) If result is NEGATIVE SARS-CoV-2 target nucleic acids are NOT DETECTED. The  SARS-CoV-2 RNA is generally detectable in upper and lower  respiratory specimens during the acute phase of infection. The lowest  concentration of SARS-CoV-2 viral copies this assay can detect is 250  copies / mL. A negative result does not preclude SARS-CoV-2 infection  and should not be used as the sole basis for treatment or other  patient management decisions.  A negative result may occur with  improper specimen collection / handling, submission of specimen other  than nasopharyngeal swab, presence of viral mutation(s) within the  areas targeted by this assay, and inadequate number of viral copies  (<250 copies / mL). A negative result must be combined with clinical  observations, patient history, and epidemiological information. If result is POSITIVE SARS-CoV-2 target nucleic acids are DETECTED. The SARS-CoV-2 RNA is generally detectable in upper and lower  respiratory specimens dur ing the acute phase of infection.  Positive  results are indicative of active infection with SARS-CoV-2.  Clinical  correlation with patient history and other diagnostic information is  necessary to determine patient infection status.  Positive results do  not rule out bacterial infection or co-infection with other viruses. If result is PRESUMPTIVE POSTIVE SARS-CoV-2 nucleic acids MAY BE PRESENT.   A presumptive positive result was obtained on the submitted specimen  and confirmed on repeat testing.  While 2019 novel coronavirus  (SARS-CoV-2) nucleic acids may be present in the submitted sample  additional confirmatory testing may be necessary for epidemiological  and / or clinical management purposes  to differentiate between  SARS-CoV-2 and other Sarbecovirus currently known to infect humans.  If clinically indicated additional testing with an alternate test  methodology [phone]([id number]) is advised. The SARS-CoV-2 RNA is generally  detectable in upper and lower respiratory sp ecimens during the acute  phase of infection. The expected result is Negative. Fact Sheet for Patients:  BoilerBrush.com.cy Fact Sheet for Healthcare Providers: https://pope.com/ This test is not yet approved or cleared by the [place] FDA and has been authorized for detection and/or diagnosis of SARS-CoV-2 by FDA under an Emergency Use Authorization (EUA).  This EUA will remain in effect (meaning this test can be used) for the duration of the COVID-19 declaration under Section 564(b)(1) of the Act, 21 U.S.C. section 360bbb-3(b)(1), unless the authorization is terminated or revoked sooner. Performed at [facility], [address] [place], [place] [address]     Dg Chest 2 View  Result Date: 02/03/2019 CLINICAL DATA:  Syncopal episode. EXAM: CHEST - 2 VIEW COMPARISON:  01/03/2019 FINDINGS: Unchanged cardiac silhouette and mediastinal contours. Retrocardiac air and fluid containing structure is unchanged compatible with a hiatal hernia. Grossly unchanged bilateral infrahilar heterogeneous opacities favored to represent atelectasis. No discrete focal airspace opacities. No pleural effusion or pneumothorax. No evidence of edema. No acute osseous abnormalities. Stigmata of DISH within the lower thoracic spine. IMPRESSION: 1.  No acute cardiopulmonary disease. 2. Hiatal hernia, unchanged. Electronically Signed   By: Simonne Come M.D.   On: 02/03/2019 16:50   Ct Head Wo Contrast  Result Date: 02/03/2019 CLINICAL DATA:  Fall, head trauma, anticoagulation EXAM: CT HEAD WITHOUT CONTRAST CT MAXILLOFACIAL WITHOUT CONTRAST CT CERVICAL SPINE WITHOUT CONTRAST TECHNIQUE: Multidetector CT imaging of the head, cervical spine, and maxillofacial structures were performed using the standard protocol without intravenous contrast. Multiplanar CT image reconstructions of the cervical spine and maxillofacial structures were also generated. COMPARISON:  None. FINDINGS: CT HEAD FINDINGS  Brain: There is subarachnoid hemorrhage about the medial bilateral frontal lobes and falx (series 4, image 23). Vascular: No hyperdense vessel or unexpected calcification. CT FACIAL BONES FINDINGS Skull: Normal. Negative for fracture or focal lesion. Facial bones: No displaced fractures or dislocations. Sinuses/Orbits: No acute finding. Other: Large left parietal and right occipital scalp hematomas. Soft tissue contusion about the left cheek, orbit, and forehead. CT CERVICAL SPINE FINDINGS Alignment: Straightening of the normal cervical lordosis. Skull base and vertebrae: No acute fracture. No primary bone lesion or focal pathologic process. Soft tissues and spinal canal: No prevertebral fluid or swelling. No visible canal hematoma. Disc levels: Mild disc space height loss and osteophytosis of C5 through C7. Upper chest: Negative. Other: None. IMPRESSION: 1. There is subarachnoid hemorrhage about the medial bilateral frontal lobes and falx (series 4, image 23). 2.  No displaced fracture or dislocation of the facial bones. 3. Large left parietal and right occipital scalp hematomas. Soft tissue contusion about the left cheek, orbit, and forehead. 4.  No fracture or static subluxation of the cervical spine. These results were called by telephone at the time of interpretation on 02/03/2019 at 4:48 pm to Dr. Chaney Malling , who verbally acknowledged these results. Electronically Signed   By: Lauralyn Primes M.D.   On: 02/03/2019 16:57   Ct Cervical Spine Wo Contrast  Result Date: 02/03/2019 CLINICAL DATA:  Fall, head trauma, anticoagulation EXAM: CT HEAD WITHOUT CONTRAST CT MAXILLOFACIAL WITHOUT CONTRAST CT CERVICAL SPINE WITHOUT CONTRAST TECHNIQUE: Multidetector CT imaging of the head, cervical spine, and maxillofacial structures were performed using the standard protocol without intravenous contrast. Multiplanar CT image reconstructions of the cervical spine and maxillofacial structures were also generated. COMPARISON:   None. FINDINGS: CT HEAD FINDINGS Brain: There is subarachnoid hemorrhage about the medial bilateral frontal lobes and falx (series 4, image 23). Vascular: No hyperdense  vessel or unexpected calcification. CT FACIAL BONES FINDINGS Skull: Normal. Negative for fracture or focal lesion. Facial bones: No displaced fractures or dislocations. Sinuses/Orbits: No acute finding. Other: Large left parietal and right occipital scalp hematomas. Soft tissue contusion about the left cheek, orbit, and forehead. CT CERVICAL SPINE FINDINGS Alignment: Straightening of the normal cervical lordosis. Skull base and vertebrae: No acute fracture. No primary bone lesion or focal pathologic process. Soft tissues and spinal canal: No prevertebral fluid or swelling. No visible canal hematoma. Disc levels: Mild disc space height loss and osteophytosis of C5 through C7. Upper chest: Negative. Other: None. IMPRESSION: 1. There is subarachnoid hemorrhage about the medial bilateral frontal lobes and falx (series 4, image 23). 2.  No displaced fracture or dislocation of the facial bones. 3. Large left parietal and right occipital scalp hematomas. Soft tissue contusion about the left cheek, orbit, and forehead. 4.  No fracture or static subluxation of the cervical spine. These results were called by telephone at the time of interpretation on 02/03/2019 at 4:48 pm to Dr. Shirlyn Goltz , who verbally acknowledged these results. Electronically Signed   By: Eddie Candle M.D.   On: 02/03/2019 16:57   Dg Shoulder Left  Result Date: 02/03/2019 CLINICAL DATA:  Loss consciousness in the shower last evening suffering a fall, now with left shoulder pain and bruising. EXAM: LEFT SHOULDER - 2+ VIEW COMPARISON:  None. FINDINGS: No fracture or dislocation. Glenohumeral joint spaces appear preserved. Mild degenerative change of the left AC joint with joint space loss, subchondral sclerosis and inferiorly directed osteophytosis. No evidence of calcific tendinitis.  Limited visualization of the adjacent thorax is normal. Regional soft tissues appear normal. IMPRESSION: 1. No acute findings. 2. Mild degenerative change of the left AC joint. Electronically Signed   By: Sandi Mariscal M.D.   On: 02/03/2019 16:48   Ct Maxillofacial Wo Contrast  Result Date: 02/03/2019 CLINICAL DATA:  Fall, head trauma, anticoagulation EXAM: CT HEAD WITHOUT CONTRAST CT MAXILLOFACIAL WITHOUT CONTRAST CT CERVICAL SPINE WITHOUT CONTRAST TECHNIQUE: Multidetector CT imaging of the head, cervical spine, and maxillofacial structures were performed using the standard protocol without intravenous contrast. Multiplanar CT image reconstructions of the cervical spine and maxillofacial structures were also generated. COMPARISON:  None. FINDINGS: CT HEAD FINDINGS Brain: There is subarachnoid hemorrhage about the medial bilateral frontal lobes and falx (series 4, image 23). Vascular: No hyperdense vessel or unexpected calcification. CT FACIAL BONES FINDINGS Skull: Normal. Negative for fracture or focal lesion. Facial bones: No displaced fractures or dislocations. Sinuses/Orbits: No acute finding. Other: Large left parietal and right occipital scalp hematomas. Soft tissue contusion about the left cheek, orbit, and forehead. CT CERVICAL SPINE FINDINGS Alignment: Straightening of the normal cervical lordosis. Skull base and vertebrae: No acute fracture. No primary bone lesion or focal pathologic process. Soft tissues and spinal canal: No prevertebral fluid or swelling. No visible canal hematoma. Disc levels: Mild disc space height loss and osteophytosis of C5 through C7. Upper chest: Negative. Other: None. IMPRESSION: 1. There is subarachnoid hemorrhage about the medial bilateral frontal lobes and falx (series 4, image 23). 2.  No displaced fracture or dislocation of the facial bones. 3. Large left parietal and right occipital scalp hematomas. Soft tissue contusion about the left cheek, orbit, and forehead. 4.  No  fracture or static subluxation of the cervical spine. These results were called by telephone at the time of interpretation on 02/03/2019 at 4:48 pm to Dr. Shirlyn Goltz , who verbally acknowledged these results. Electronically  Signed   By: Lauralyn PrimesAlex  Bibbey M.D.   On: 02/03/2019 16:57     Assessment/Plan: Patient who fell 2 nights ago, who is neurologically intact on examination, who has profound hyponatremia and CT of the head reveals minimal traumatic subarachnoid hemorrhage.  Notably he is on Brilinta, but was given DDAVP.  I discussed the case with Dr. Silverio LayYao as well as with Dr. Molli KnockYacoub (CCM).  Patient will need neurochecks and follow-up CT of the brain without contrast tomorrow.  Treatment of metabolic derangements deferred to CCM service.  Do not anticipate a need for neurosurgical intervention or follow-up.  Hewitt ShortsNUDELMAN,ROBERT W, MD 02/03/2019, 6:59 PM

## 2019-02-04 ENCOUNTER — Inpatient Hospital Stay (HOSPITAL_COMMUNITY): Payer: 59

## 2019-02-04 DIAGNOSIS — I609 Nontraumatic subarachnoid hemorrhage, unspecified: Secondary | ICD-10-CM

## 2019-02-04 LAB — CBC WITH DIFFERENTIAL/PLATELET
Abs Immature Granulocytes: 0.07 10*3/uL (ref 0.00–0.07)
Basophils Absolute: 0 10*3/uL (ref 0.0–0.1)
Basophils Relative: 0 %
Eosinophils Absolute: 0 10*3/uL (ref 0.0–0.5)
Eosinophils Relative: 0 %
HCT: 26.8 % — ABNORMAL LOW (ref 39.0–52.0)
Hemoglobin: 9.7 g/dL — ABNORMAL LOW (ref 13.0–17.0)
Immature Granulocytes: 1 %
Lymphocytes Relative: 5 %
Lymphs Abs: 0.6 10*3/uL — ABNORMAL LOW (ref 0.7–4.0)
MCH: 33.1 pg (ref 26.0–34.0)
MCHC: 36.2 g/dL — ABNORMAL HIGH (ref 30.0–36.0)
MCV: 91.5 fL (ref 80.0–100.0)
Monocytes Absolute: 1.2 10*3/uL — ABNORMAL HIGH (ref 0.1–1.0)
Monocytes Relative: 11 %
Neutro Abs: 8.8 10*3/uL — ABNORMAL HIGH (ref 1.7–7.7)
Neutrophils Relative %: 83 %
Platelets: 192 10*3/uL (ref 150–400)
RBC: 2.93 MIL/uL — ABNORMAL LOW (ref 4.22–5.81)
RDW: 12.7 % (ref 11.5–15.5)
WBC: 10.6 10*3/uL — ABNORMAL HIGH (ref 4.0–10.5)
nRBC: 0 % (ref 0.0–0.2)

## 2019-02-04 LAB — GLUCOSE, CAPILLARY
Glucose-Capillary: 123 mg/dL — ABNORMAL HIGH (ref 70–99)
Glucose-Capillary: 143 mg/dL — ABNORMAL HIGH (ref 70–99)

## 2019-02-04 LAB — BASIC METABOLIC PANEL
Anion gap: 15 (ref 5–15)
Anion gap: 14 (ref 5–15)
Anion gap: 15 (ref 5–15)
Anion gap: 12 (ref 5–15)
BUN: 18 mg/dL (ref 6–20)
BUN: 18 mg/dL (ref 6–20)
BUN: 17 mg/dL (ref 6–20)
BUN: 16 mg/dL (ref 6–20)
CO2: 22 mmol/L (ref 22–32)
CO2: 23 mmol/L (ref 22–32)
CO2: 24 mmol/L (ref 22–32)
CO2: 25 mmol/L (ref 22–32)
Calcium: 8.2 mg/dL — ABNORMAL LOW (ref 8.9–10.3)
Calcium: 8.4 mg/dL — ABNORMAL LOW (ref 8.9–10.3)
Calcium: 8.4 mg/dL — ABNORMAL LOW (ref 8.9–10.3)
Calcium: 8.5 mg/dL — ABNORMAL LOW (ref 8.9–10.3)
Chloride: 84 mmol/L — ABNORMAL LOW (ref 98–111)
Chloride: 84 mmol/L — ABNORMAL LOW (ref 98–111)
Chloride: 83 mmol/L — ABNORMAL LOW (ref 98–111)
Chloride: 83 mmol/L — ABNORMAL LOW (ref 98–111)
Creatinine, Ser: 1.12 mg/dL (ref 0.61–1.24)
Creatinine, Ser: 1.12 mg/dL (ref 0.61–1.24)
Creatinine, Ser: 1.05 mg/dL (ref 0.61–1.24)
Creatinine, Ser: 0.99 mg/dL (ref 0.61–1.24)
GFR calc Af Amer: >60 mL/min (ref >60)
GFR calc Af Amer: >60 mL/min (ref >60)
GFR calc Af Amer: >60 mL/min (ref >60)
GFR calc Af Amer: >60 mL/min (ref >60)
GFR calc non Af Amer: >60 mL/min (ref >60)
GFR calc non Af Amer: >60 mL/min (ref >60)
GFR calc non Af Amer: >60 mL/min (ref >60)
GFR calc non Af Amer: >60 mL/min (ref >60)
Glucose, Bld: 127 mg/dL — ABNORMAL HIGH (ref 70–99)
Glucose, Bld: 122 mg/dL — ABNORMAL HIGH (ref 70–99)
Glucose, Bld: 119 mg/dL — ABNORMAL HIGH (ref 70–99)
Glucose, Bld: 143 mg/dL — ABNORMAL HIGH (ref 70–99)
Potassium: 3.5 mmol/L (ref 3.5–5.1)
Potassium: 3.5 mmol/L (ref 3.5–5.1)
Potassium: 3.6 mmol/L (ref 3.5–5.1)
Potassium: 3.7 mmol/L (ref 3.5–5.1)
Sodium: 121 mmol/L — ABNORMAL LOW (ref 135–145)
Sodium: 121 mmol/L — ABNORMAL LOW (ref 135–145)
Sodium: 122 mmol/L — ABNORMAL LOW (ref 135–145)
Sodium: 120 mmol/L — ABNORMAL LOW (ref 135–145)

## 2019-02-04 LAB — MAGNESIUM: Magnesium: 1.8 mg/dL (ref 1.7–2.4)

## 2019-02-04 LAB — CK
Total CK: 1718 U/L — ABNORMAL HIGH (ref 49–397)
Total CK: 1442 U/L — ABNORMAL HIGH (ref 49–397)

## 2019-02-04 MED ORDER — MAGNESIUM SULFATE 2 GM/50ML IV SOLN
2.0000 g | Freq: Once | INTRAVENOUS | Status: AC
  Administered 2019-02-04: 2 g via INTRAVENOUS
  Filled 2019-02-04: qty 50

## 2019-02-04 MED ORDER — LORAZEPAM 2 MG/ML IJ SOLN: 2.0000 mg | INTRAMUSCULAR | Status: DC | PRN

## 2019-02-04 MED ORDER — METOPROLOL TARTRATE 50 MG PO TABS
50.0000 mg | ORAL_TABLET | Freq: Two times a day (BID) | ORAL | Status: DC
  Administered 2019-02-04 – 2019-02-10 (×10): 50 mg via ORAL
  Filled 2019-02-04: qty 1
  Filled 2019-02-04 (×2): qty 4
  Filled 2019-02-04: qty 1
  Filled 2019-02-04: qty 4
  Filled 2019-02-04: qty 1
  Filled 2019-02-04: qty 4
  Filled 2019-02-04 (×3): qty 1
  Filled 2019-02-04: qty 4

## 2019-02-04 MED ORDER — PANTOPRAZOLE SODIUM 40 MG PO TBEC
40.0000 mg | DELAYED_RELEASE_TABLET | Freq: Every day | ORAL | Status: DC
  Administered 2019-02-04: 40 mg via ORAL
  Filled 2019-02-04 (×2): qty 1

## 2019-02-04 MED ORDER — DEXMEDETOMIDINE HCL IN NACL 400 MCG/100ML IV SOLN
0.4000 ug/kg/h | INTRAVENOUS | Status: DC
  Administered 2019-02-04: 0.4 ug/kg/h via INTRAVENOUS
  Administered 2019-02-05: 0.3 ug/kg/h via INTRAVENOUS
  Administered 2019-02-05 – 2019-02-06 (×2): 1 ug/kg/h via INTRAVENOUS
  Filled 2019-02-04 (×7): qty 100

## 2019-02-04 MED ORDER — ROSUVASTATIN CALCIUM 20 MG PO TABS
40.0000 mg | ORAL_TABLET | Freq: Every evening | ORAL | Status: DC
  Administered 2019-02-06 – 2019-02-09 (×4): 40 mg via ORAL
  Filled 2019-02-04 (×7): qty 2

## 2019-02-04 MED ORDER — SERTRALINE HCL 50 MG PO TABS
50.0000 mg | ORAL_TABLET | Freq: Every day | ORAL | Status: DC
  Administered 2019-02-04 – 2019-02-10 (×6): 50 mg via ORAL
  Filled 2019-02-04 (×7): qty 1

## 2019-02-04 MED ORDER — LORAZEPAM 2 MG/ML IJ SOLN
1.0000 mg | INTRAMUSCULAR | Status: DC | PRN
  Administered 2019-02-04 – 2019-02-05 (×3): 2 mg via INTRAVENOUS
  Administered 2019-02-06: 1 mg via INTRAVENOUS
  Filled 2019-02-04 (×4): qty 1

## 2019-02-04 MED ORDER — CHLORHEXIDINE GLUCONATE CLOTH 2 % EX PADS
6.0000 | MEDICATED_PAD | Freq: Every day | CUTANEOUS | Status: DC
  Administered 2019-02-05 – 2019-02-10 (×5): 6 via TOPICAL

## 2019-02-04 NOTE — Progress Notes (Signed)
Did bedside swallow eval, pt w/ a lot of coughing. ST eval in, contacted Rehab department, relayed pt is waiting for eval for diet order.

## 2019-02-04 NOTE — Evaluation (Signed)
Clinical/Bedside Swallow Evaluation Patient Details  Name: Dustin Velazquez MRN: [id number] Date of Birth: 01/01/1961  Today's Date: 02/04/2019 Time: SLP Start Time (ACUTE ONLY): 1110 SLP Stop Time (ACUTE ONLY): 1150 SLP Time Calculation (min) (ACUTE ONLY): 40 min  Past Medical History:  Past Medical History:  Diagnosis Date  . ETOH abuse   . GERD (gastroesophageal reflux disease)   . Hypercholesteremia   . Hypertension   . Sleep walking disorder    Past Surgical History:  Past Surgical History:  Procedure Laterality Date  . APPENDECTOMY    . CORONARY STENT INTERVENTION N/A 12/30/2016   Procedure: Coronary Stent Intervention;  Surgeon: Wellington Hampshire, MD;  Location: [place] CV LAB;  Service: Cardiovascular;  Laterality: N/A;  . KNEE SURGERY    . LEFT HEART CATH AND CORONARY ANGIOGRAPHY N/A 12/30/2016   Procedure: Left Heart Cath and Coronary Angiography;  Surgeon: Wellington Hampshire, MD;  Location: [place] CV LAB;  Service: Cardiovascular;  Laterality: N/A;   HPI:  58 yo M admitted 02/03/2019, fell and hit his head upon exiting the shower. Pt found to be hyponatremic 118 and have SAH. PMH: EtOH abuse, NSTEMI with stent placement, HTN, MDD, Anxiety, Tobacco use disorder, HLD. Head CT = Decreasing subarachnoid hemorrhage in the interhemisphericfissure.   Assessment / Plan / Recommendation Clinical Impression  Pt presents with adequate oral motor strength and function. CN exam unremarkable. Pt reports chronic smokers cough, and indicates he is trying to quit smoking. Pt completed oral care after set up. Following oral care, pt accepted trials of ice chips, thin liquid, puree, and solid textures. Pt passed 3oz water challenge, indicating low risk of aspiration. Occasional cough was noted, however, pt reported it to be his "smokers cough". Will begin regular diet and thin liquids, meds whole with liquid. Safe swallow precautions posted at [facility]. RN and MD informed. SLP will continue  to follow acutely to assess diet tolerance and continue education.   SLP Visit Diagnosis: Dysphagia, unspecified (R13.10)    Aspiration Risk  Mild aspiration risk    Diet Recommendation Regular;Thin liquid   Liquid Administration via: Cup;Straw Medication Administration: Whole meds with liquid Supervision: Patient able to self feed;Intermittent supervision to cue for compensatory strategies Compensations: Minimize environmental distractions;Slow rate;Small sips/bites Postural Changes: Seated upright at 90 degrees;Remain upright for at least 30 minutes after po intake    Other  Recommendations Oral Care Recommendations: Oral care BID   Follow up Recommendations Other (comment)(TBD)      Frequency and Duration min 1 x/week  1 week;2 weeks       Prognosis Prognosis for Safe Diet Advancement: Good      Swallow Study   General Date of Onset: 02/03/19 HPI: 58 yo M admitted 02/03/2019, fell and hit his head upon exiting the shower. Pt found to be hyponatremic 118 and have SAH. PMH: EtOH abuse, NSTEMI with stent placement, HTN, MDD, Anxiety, Tobacco use disorder, HLD. Head CT = Decreasing subarachnoid hemorrhage in the interhemisphericfissure. Type of Study: Bedside Swallow Evaluation Previous Swallow Assessment: none found Diet Prior to this Study: NPO Temperature Spikes Noted: No Respiratory Status: Room air History of Recent Intubation: No Behavior/Cognition: Alert;Cooperative Oral Cavity Assessment: Within Functional Limits Oral Care Completed by SLP: Yes Oral Cavity - Dentition: Missing dentition Vision: Functional for self-feeding Self-Feeding Abilities: Able to feed self Patient Positioning: Upright in bed Baseline Vocal Quality: Normal Volitional Cough: Strong Volitional Swallow: Able to elicit    Oral/Motor/Sensory Function [facility]  Ice Chips Ice  chips: Within functional limits Presentation: Spoon   Thin Liquid Thin Liquid: Within functional limits Presentation:  Cup;Straw    Nectar Thick Nectar Thick Liquid: Not tested   Honey Thick Honey Thick Liquid: Not tested   Puree Puree: Within functional limits Presentation: Spoon   Solid     Solid: Within functional limits Presentation: Self Fed     Maddalynn Barnard B. Murvin Natal [facility], CCC-SLP Speech Language Pathologist [phone]  Leigh Aurora 02/04/2019,12:09 PM

## 2019-02-04 NOTE — Progress Notes (Signed)
Pt w/ low urine output this shift.  Did put out 200 ml. Bladder scan after showing >475. Straight cath performed, 625 out.  Received verbal orders from CCM NP to place foley if continues to retain.   Pt continues to become more confused and agitated throughout shift.  Relayed to CCM NP. Repeat CT of head to be ordered.

## 2019-02-04 NOTE — Progress Notes (Signed)
Subjective: Report on patient's condition through the day given to me by his nurse Shanda Bumps.  Initially had been awake and alert, oriented, following commands, and moving all extremities well.  However he became increasingly confused and restless, eventually developing what sounds like a delirium, and he has been started on Precedex.  It is suspected by the ICU and CCM staff that he is experiencing increasing difficulties from alcohol withdrawal.  CT the brain was repeated twice today including at 1000 and 1730.  Both studies show stable to improved minimal frontal interhemispheric traumatic subarachnoid hemorrhage.  No other intracranial pathology is developed.  His Brilinta was stopped yesterday.  Laboratories continue to show a significant hyponatremia (sodium 120).  Objective: Vital signs in last 24 hours: Vitals:   02/04/19 1300 02/04/19 1400 02/04/19 1500 02/04/19 1600  BP: 123/69 114/79 120/78 112/81  Pulse: (!) 102 84 64 89  Resp: (!) 32 (!) 36 (!) 35 (!) 28  Temp:   98 F (36.7 C)   TempSrc:   Oral   SpO2: 98% 97% (!) 86% 100%  Weight:      Height:        Intake/Output from previous day: 07/29 0701 - 07/30 0700 In: 48.9 [IV Piggyback:48.9] Out: 650 [Urine:650] Intake/Output this shift: No intake/output data recorded.  Physical Exam: Lethargic, but arousable to voice and gentle stimulation.  Oriented to his name and 2020, but not to place ([place], [place]).  Following simple commands (holding up 2 fingers).  Moving all 4 extremities actively.  CBC Recent Labs    02/03/19 2047 02/04/19 0255  WBC 10.4 10.6*  HGB 9.8* 9.7*  HCT 27.3* 26.8*  PLT 186 192   BMET Recent Labs    02/04/19 1043 02/04/19 1808  NA 122* 120*  K 3.6 3.7  CL 83* 83*  CO2 24 25  GLUCOSE 119* 143*  BUN 17 16  CREATININE 1.05 0.99  CALCIUM 8.4* 8.5*    Studies/Results: Dg Chest 2 View  Result Date: 02/03/2019 CLINICAL DATA:  Syncopal episode. EXAM: CHEST - 2 VIEW  COMPARISON:  01/03/2019 FINDINGS: Unchanged cardiac silhouette and mediastinal contours. Retrocardiac air and fluid containing structure is unchanged compatible with a hiatal hernia. Grossly unchanged bilateral infrahilar heterogeneous opacities favored to represent atelectasis. No discrete focal airspace opacities. No pleural effusion or pneumothorax. No evidence of edema. No acute osseous abnormalities. Stigmata of DISH within the lower thoracic spine. IMPRESSION: 1.  No acute cardiopulmonary disease. 2. Hiatal hernia, unchanged. Electronically Signed   By: Simonne Come M.D.   On: 02/03/2019 16:50   Ct Head Wo Contrast  Result Date: 02/04/2019 CLINICAL DATA:  Follow-up intracranial hemorrhage. History of fall, anticoagulation EXAM: CT HEAD WITHOUT CONTRAST TECHNIQUE: Contiguous axial images were obtained from the base of the skull through the vertex without intravenous contrast. COMPARISON:  CT head 02/04/2019 FINDINGS: Brain: Subarachnoid hemorrhage in the anterior interhemispheric fissure is unchanged from the prior study. No new hemorrhage. No subdural hematoma or midline shift. Ventricle size normal. Negative for acute infarct. Vascular: Negative for hyperdense vessel Skull: Negative for skull fracture. Extensive soft tissue swelling left scalp. Sinuses/Orbits: Paranasal sinuses clear. Left mastoid effusion. Negative orbit. Other: None IMPRESSION: Anterior subarachnoid hemorrhage is stable.  No new hemorrhage Left mastoid effusion unchanged. Electronically Signed   By: Marlan Palau M.D.   On: 02/04/2019 17:30   Ct Head Wo Contrast  Result Date: 02/04/2019 CLINICAL DATA:  Follow-up subarachnoid hemorrhage EXAM: CT HEAD WITHOUT CONTRAST TECHNIQUE: Contiguous axial images were obtained  from the base of the skull through the vertex without intravenous contrast. COMPARISON:  Yesterday FINDINGS: Brain: Fading interhemispheric subarachnoid hemorrhage. No new hemorrhage, hydrocephalus, or infarct. Stable  small low-density in the right internal capsule. Vascular: Atherosclerotic plaque. Skull: Subgaleal hematoma which has thickened. No calvarial fracture. Left mastoid opacification that is chronic based on prior imaging. Sinuses/Orbits: Negative IMPRESSION: 1. Decreasing subarachnoid hemorrhage in the interhemispheric fissure. 2. Subgaleal swelling with progression. Electronically Signed   By: Marnee SpringJonathon  Watts M.D.   On: 02/04/2019 10:06   Ct Head Wo Contrast  Result Date: 02/03/2019 CLINICAL DATA:  Fall, head trauma, anticoagulation EXAM: CT HEAD WITHOUT CONTRAST CT MAXILLOFACIAL WITHOUT CONTRAST CT CERVICAL SPINE WITHOUT CONTRAST TECHNIQUE: Multidetector CT imaging of the head, cervical spine, and maxillofacial structures were performed using the standard protocol without intravenous contrast. Multiplanar CT image reconstructions of the cervical spine and maxillofacial structures were also generated. COMPARISON:  None. FINDINGS: CT HEAD FINDINGS Brain: There is subarachnoid hemorrhage about the medial bilateral frontal lobes and falx (series 4, image 23). Vascular: No hyperdense vessel or unexpected calcification. CT FACIAL BONES FINDINGS Skull: Normal. Negative for fracture or focal lesion. Facial bones: No displaced fractures or dislocations. Sinuses/Orbits: No acute finding. Other: Large left parietal and right occipital scalp hematomas. Soft tissue contusion about the left cheek, orbit, and forehead. CT CERVICAL SPINE FINDINGS Alignment: Straightening of the normal cervical lordosis. Skull base and vertebrae: No acute fracture. No primary bone lesion or focal pathologic process. Soft tissues and spinal canal: No prevertebral fluid or swelling. No visible canal hematoma. Disc levels: Mild disc space height loss and osteophytosis of C5 through C7. Upper chest: Negative. Other: None. IMPRESSION: 1. There is subarachnoid hemorrhage about the medial bilateral frontal lobes and falx (series 4, image 23). 2.  No  displaced fracture or dislocation of the facial bones. 3. Large left parietal and right occipital scalp hematomas. Soft tissue contusion about the left cheek, orbit, and forehead. 4.  No fracture or static subluxation of the cervical spine. These results were called by telephone at the time of interpretation on 02/03/2019 at 4:48 pm to Dr. Chaney MallingAVID YAO , who verbally acknowledged these results. Electronically Signed   By: Lauralyn PrimesAlex  Bibbey M.D.   On: 02/03/2019 16:57   Ct Cervical Spine Wo Contrast  Result Date: 02/03/2019 CLINICAL DATA:  Fall, head trauma, anticoagulation EXAM: CT HEAD WITHOUT CONTRAST CT MAXILLOFACIAL WITHOUT CONTRAST CT CERVICAL SPINE WITHOUT CONTRAST TECHNIQUE: Multidetector CT imaging of the head, cervical spine, and maxillofacial structures were performed using the standard protocol without intravenous contrast. Multiplanar CT image reconstructions of the cervical spine and maxillofacial structures were also generated. COMPARISON:  None. FINDINGS: CT HEAD FINDINGS Brain: There is subarachnoid hemorrhage about the medial bilateral frontal lobes and falx (series 4, image 23). Vascular: No hyperdense vessel or unexpected calcification. CT FACIAL BONES FINDINGS Skull: Normal. Negative for fracture or focal lesion. Facial bones: No displaced fractures or dislocations. Sinuses/Orbits: No acute finding. Other: Large left parietal and right occipital scalp hematomas. Soft tissue contusion about the left cheek, orbit, and forehead. CT CERVICAL SPINE FINDINGS Alignment: Straightening of the normal cervical lordosis. Skull base and vertebrae: No acute fracture. No primary bone lesion or focal pathologic process. Soft tissues and spinal canal: No prevertebral fluid or swelling. No visible canal hematoma. Disc levels: Mild disc space height loss and osteophytosis of C5 through C7. Upper chest: Negative. Other: None. IMPRESSION: 1. There is subarachnoid hemorrhage about the medial bilateral frontal lobes and  falx (series 4,  image 23). 2.  No displaced fracture or dislocation of the facial bones. 3. Large left parietal and right occipital scalp hematomas. Soft tissue contusion about the left cheek, orbit, and forehead. 4.  No fracture or static subluxation of the cervical spine. These results were called by telephone at the time of interpretation on 02/03/2019 at 4:48 pm to Dr. Chaney MallingAVID YAO , who verbally acknowledged these results. Electronically Signed   By: Lauralyn PrimesAlex  Bibbey M.D.   On: 02/03/2019 16:57   Dg Shoulder Left  Result Date: 02/03/2019 CLINICAL DATA:  Loss consciousness in the shower last evening suffering a fall, now with left shoulder pain and bruising. EXAM: LEFT SHOULDER - 2+ VIEW COMPARISON:  None. FINDINGS: No fracture or dislocation. Glenohumeral joint spaces appear preserved. Mild degenerative change of the left AC joint with joint space loss, subchondral sclerosis and inferiorly directed osteophytosis. No evidence of calcific tendinitis. Limited visualization of the adjacent thorax is normal. Regional soft tissues appear normal. IMPRESSION: 1. No acute findings. 2. Mild degenerative change of the left AC joint. Electronically Signed   By: Simonne ComeJohn  Watts M.D.   On: 02/03/2019 16:48   Ct Maxillofacial Wo Contrast  Result Date: 02/03/2019 CLINICAL DATA:  Fall, head trauma, anticoagulation EXAM: CT HEAD WITHOUT CONTRAST CT MAXILLOFACIAL WITHOUT CONTRAST CT CERVICAL SPINE WITHOUT CONTRAST TECHNIQUE: Multidetector CT imaging of the head, cervical spine, and maxillofacial structures were performed using the standard protocol without intravenous contrast. Multiplanar CT image reconstructions of the cervical spine and maxillofacial structures were also generated. COMPARISON:  None. FINDINGS: CT HEAD FINDINGS Brain: There is subarachnoid hemorrhage about the medial bilateral frontal lobes and falx (series 4, image 23). Vascular: No hyperdense vessel or unexpected calcification. CT FACIAL BONES FINDINGS Skull:  Normal. Negative for fracture or focal lesion. Facial bones: No displaced fractures or dislocations. Sinuses/Orbits: No acute finding. Other: Large left parietal and right occipital scalp hematomas. Soft tissue contusion about the left cheek, orbit, and forehead. CT CERVICAL SPINE FINDINGS Alignment: Straightening of the normal cervical lordosis. Skull base and vertebrae: No acute fracture. No primary bone lesion or focal pathologic process. Soft tissues and spinal canal: No prevertebral fluid or swelling. No visible canal hematoma. Disc levels: Mild disc space height loss and osteophytosis of C5 through C7. Upper chest: Negative. Other: None. IMPRESSION: 1. There is subarachnoid hemorrhage about the medial bilateral frontal lobes and falx (series 4, image 23). 2.  No displaced fracture or dislocation of the facial bones. 3. Large left parietal and right occipital scalp hematomas. Soft tissue contusion about the left cheek, orbit, and forehead. 4.  No fracture or static subluxation of the cervical spine. These results were called by telephone at the time of interpretation on 02/03/2019 at 4:48 pm to Dr. Chaney MallingAVID YAO , who verbally acknowledged these results. Electronically Signed   By: Lauralyn PrimesAlex  Bibbey M.D.   On: 02/03/2019 16:57    Assessment/Plan: Delirium related to suspected alcohol withdrawal, quite possibly also contributed to by hyponatremia.  Patient apparently fell 3 nights ago (while on Brilinta), CT last night showed minimal traumatic subarachnoid hemorrhage which is stable to improved on repeat CTs.  I do not anticipate worsening of the minimal intracranial hemorrhage and do not anticipate a need for neurosurgical intervention.  We will sign off for now.  There is no need for neurosurgical follow-up at this time.  His continued care will be with the ICU and CCM service.  I would anticipate that ultimately his post discharge care will be through his primary  provider.  Hosie Spangle, MD 02/04/2019,  7:14 PM

## 2019-02-04 NOTE — Progress Notes (Signed)
eLink Physician-Brief Progress Note Patient Name: Dustin Velazquez DOB: April 04, 1961 MRN: [id number]   Date of Service  02/04/2019  HPI/Events of Note  Hyponatremia - Na+ = 119 --> 120 --> 121 without IV fluid.   eICU Interventions  Continue to trend serial Na+.      Intervention Category Major Interventions: Electrolyte abnormality - evaluation and management  Nimai Burbach [place] 02/04/2019, 5:06 AM

## 2019-02-04 NOTE — Progress Notes (Addendum)
RN was sitting outside pts room, heard commotion, ran to pts room.  Pt found sitting  on the floor near head of bed, bed rails still up,"out of bed"  bed alarm on . Pt physically examined, scab reopened on LUE, otherwise no changes. Full neuro check completed and no changes.  Pt able to tell me name, birthday, current month, where he was and why. Pupils equal, round, reactive. Pt states no pain.  Per pt, was attempting to get out of bed, slipped and fell on bottom.  Pt states he did not hit head.  Pt put back to bed. Bed alarm put on most sensitive setting.  Telesitter ordered.  CCM NP aware and at bedside. Plan to monitor for any additional neuro changes and order additional CT of head if needed.  Safety huddle and safety zone completed.

## 2019-02-04 NOTE — Progress Notes (Signed)
NAME:  Dustin Velazquez, MRN:  [id number], DOB:  10/13/1960, LOS: 1 ADMISSION DATE:  02/03/2019, CONSULTATION DATE:  02/03/2019 REFERRING MD:  Silverio LayYao - EM, CHIEF COMPLAINT:  Fall. SAH, Hyponatremia  Brief History   58 yo M EtOH abuse, found to be hyponatremic 118 and have SAH. Neurosurgery Consulted.   History of present illness   History obtained from chart   58 yo M PMH EtOH abuse (last relapse 7/4), tobacco use, GERD, HTN, HLD, MDD, NSTEMI with stent placement (on Brilinta?), who presents 7/29 after sustaining a fall 7/28 at night. The patient was reportedly exiting shower, fell and hit head on toilet seat. Patient endorses + LOC. Patient reportedly fell again due to dizziness/LOC. Patient states he takes Brilinta for heart stents. Patient denies recent EtOH use, citing last use approx 3 weeks ago. Of note, patient discharged from ED with short course of Librium on 7/5, following by Dr. Dorris FetchHendrickson. Appears patient has missed follow up appointments the past several days, with telephone encounter notes citing nausea, diarrhea.  Depression reportedly worse in setting of marital separation approx 6 months ago. Patient endorses weight loss and poor appetite. Patient denies SOB, chest pain, night sweats.  In ED patient taken for CT head, maxillofacial, and Cspine. CT head reveals SAH. CT Mf and Cspine unremarkable. ED reportedly has consulted NSGY and patient is deemed not surgical candidate. NSGY requesting q1hr neurochecks. PCCM to admit.   Other significant ED findings include Na 118 and Cr 1.8. DDAVP given in ED for Brilinta, and isotonic saline started for chronic hyponatremia.    Past Medical History  EtOH abuse (last reported use 7/4, Librium initiated 7/5)  NSTEMI with stent placement, on Brilinta HTN MDD Anxiety Tobacco use disorder  HLD   Significant Hospital Events   7/29 presents to ED. Admitted to ICU  Consults:  NSGY  Procedures:    Significant Diagnostic Tests:  7/29 CT  Head non con > SAH of medial bilateral frontal lobes. L Parietal R occipital hematomas.  7/29 CT maxillofacial, C spine non con> No acute skull base or facial bone fracture. Mild disc space height loss C5-C7. No fracture or sublax of cervical spine.  7/29 XR L shoulder> no acute fracture or dislocation 7/30 CT Head > Decreasing SAH   Micro Data:  7/29 SARS CoV2 > negative    Antimicrobials:    Interim history/subjective:  No events overnight remains neurologically intact and hemodynamically stable.   Objective   Blood pressure 124/83, pulse (!) 104, temperature 98 F (36.7 C), temperature source Oral, resp. rate (!) 27, height 5\' 9"  (1.753 m), weight 79 kg, SpO2 97 %.        Intake/Output Summary (Last 24 hours) at 02/04/2019 0947 Last data filed at 02/04/2019 0600 Gross per 24 hour  Intake 48.93 ml  Output 650 ml  Net -601.07 ml   Filed Weights   02/03/19 1522 02/04/19 0500  Weight: 79.8 kg 79 kg    Examination: General: Adult male, no distress, sitting in bed HENT: pupils intact, bruising noted to face, hematoma to back of head  Lungs: Clear breath sounds, no wheeze/crackles  Cardiovascular: RRR, no MRG Abdomen: soft, non-tender, active bowel sounds  Extremities: -edema, scattered bruising noted throughout upper extremities  Neuro: alert, oriented, follows commands 4/5 on Left, 5/5 on right, pupils intact, reactive  GU: Foley in place   Resolved Hospital Problem list     Assessment & Plan:   SDH in medial bilateral frontal lobes and falx  s/p fall, on [place] - Neurosurgery consulted. No surgical intervention for now. Pt received DDAVP 0.7mck/kg in ED. -Neuro checks  -Bedside Swallow Evaluation -SP/OT/PT   CAD s/p Stent to Left Circumflex  H/O HTN, HLD  Plan -Per EMR review, visit with cardiology Kathlyn [place], MD) 03/05/18 plan was   to continue Brilinta for one year then stop. >> Will not restart upon D/C  -Hold Brilinta/ASA in setting of SDH  -Continue Metoprolol, Crestor -Hold Lisinopril, Hydralazine   Hypovolemic Hyponatremia-likely acute on chronic, c/b dehydration. DDAVP in ED of some secondary benefit.   Acute Kidney Injury, Rhabdomyolysis  Plan -Trend BMP > Correcting and rising at stable rate, currently not on fluids  -Trend CK > 1718 (1973)  -Avoid nephrotoxins  Alcohol dependence and abuse. Last reported intake January 09, 2019. On 7/5 started short course librium H/O Depression  Plan -CIWA  -Thiamine, MVI, folic acid  -Continue home Zoloft   GERD Plan -Protonix   Best practice:  Diet: ST evaluation > Regular diet okay  GI prophylaxis: continue home PPI  Mobility: PT ordered  Code Status: FULL  Family Communication: Patient update on plan of care.  Disposition: Transfer to [facility] with q4h neuro checks with [facility] 7/31 with PCCM off.   Labs   CBC: Last Labs      Recent Labs  Lab 02/03/19 1530  WBC 12.5*  NEUTROABS 11.0*  HGB 11.3*  HCT 31.6*  MCV 91.9  PLT 221      Basic Metabolic Panel: Last Labs      Recent Labs  Lab 02/03/19 1530  NA 118*  K 4.1  CL 75*  CO2 20*  GLUCOSE 138*  BUN 21*  CREATININE 1.80*  CALCIUM 8.6*     GFR: Estimated Creatinine Clearance: 44.7 mL/min (A) (by C-G formula based on SCr of 1.8 mg/dL (H)). Last Labs      Recent Labs  Lab 02/03/19 1530  WBC 12.5*      Liver Function Tests: Last Labs      Recent Labs  Lab 02/03/19 1530  AST 143*  ALT 116*  ALKPHOS 65  BILITOT 1.9*  PROT 6.3*  ALBUMIN 3.6     Last Labs   No results for input(s): LIPASE, AMYLASE in the last 168 hours.   Last Labs   No results for input(s): AMMONIA in the last 168 hours.    ABG Labs (Brief)  No results found for: PHART, PCO2ART, PO2ART, HCO3, TCO2, ACIDBASEDEF, O2SAT     Coagulation Profile: Last Labs      Recent Labs  Lab 02/03/19 1700  INR 1.0      Cardiac Enzymes: Last Labs   No results for input(s): CKTOTAL, CKMB, CKMBINDEX,  TROPONINI in the last 168 hours.    HbA1C: Last Labs  No results found for: HGBA1C    CBG: Last Labs   No results for input(s): GLUCAP in the last 168 hours.    Review of Systems:   Review of Systems  Constitutional: Positive for weight loss.  HENT: Negative.   Eyes: Negative.   Respiratory: Negative.   Cardiovascular: Negative.   Gastrointestinal: Positive for diarrhea and nausea.  Genitourinary: Negative.   Musculoskeletal: Positive for falls.  Skin: Negative.   Neurological: Positive for dizziness, weakness and headaches.  Endo/Heme/Allergies: Bruises/bleeds easily.  Psychiatric/Behavioral: Positive for depression. The patient is nervous/anxious.        Has had SI in past but not current      Past Medical History  He,  has a past medical history of ETOH abuse, GERD (gastroesophageal reflux disease), Hypercholesteremia, Hypertension, and Sleep walking disorder.   Surgical History    Past Surgical History:  Procedure Laterality Date  . APPENDECTOMY    . CORONARY STENT INTERVENTION N/A 12/30/2016   Procedure: Coronary Stent Intervention;  Surgeon: Iran Ouch, MD;  Location: [facility] INVASIVE CV LAB;  Service: Cardiovascular;  Laterality: N/A;  . KNEE SURGERY    . LEFT HEART CATH AND CORONARY ANGIOGRAPHY N/A 12/30/2016   Procedure: Left Heart Cath and Coronary Angiography;  Surgeon: Iran Ouch, MD;  Location: [facility] INVASIVE CV LAB;  Service: Cardiovascular;  Laterality: N/A;     Social History   reports that he has been smoking cigarettes. He has a 7.50 pack-year smoking history. He has never used smokeless tobacco. He reports current alcohol use of about 8.0 standard drinks of alcohol per week. He reports that he does not use drugs.   Family History   His family history includes Hyperlipidemia in his father; Hypertension in his mother.   Allergies Allergies  Allergen Reactions  . Compazine [Prochlorperazine Edisylate] Other (See Comments)    Seizures       Home Medications  Prior to Admission medications   Medication Sig Start Date End Date Taking? Authorizing Provider  aspirin EC 81 MG tablet Take 1 tablet (81 mg total) by mouth daily. 12/16/18   Clapacs, Jackquline Denmark, MD  hydrochlorothiazide (HYDRODIURIL) 25 MG tablet Take 1 tablet (25 mg total) by mouth daily. 01/25/19   Jamelle Haring, MD  lisinopril (ZESTRIL) 40 MG tablet Take 1 tablet (40 mg total) by mouth daily. 12/16/18   Clapacs, Jackquline Denmark, MD  metoprolol tartrate (LOPRESSOR) 50 MG tablet Take 1 tablet (50 mg total) by mouth 2 (two) times daily. 12/16/18   Clapacs, Jackquline Denmark, MD  naltrexone (DEPADE) 50 MG tablet Take 50 mg by mouth daily.  12/17/18   [provider]  omeprazole (PRILOSEC) 20 MG capsule Take 20 mg by mouth 2 (two) times a day. 01/18/19   [provider]  pantoprazole (PROTONIX) 40 MG tablet Take 1 tablet (40 mg total) by mouth daily. 12/17/18   Clapacs, Jackquline Denmark, MD  rosuvastatin (CRESTOR) 40 MG tablet Take 1 tablet (40 mg total) by mouth daily. Patient taking differently: Take 40 mg by mouth every evening.  12/16/18 03/16/19  Clapacs, Jackquline Denmark, MD  sertraline (ZOLOFT) 50 MG tablet Take 50 mg by mouth daily.  12/21/18   [provider]  ticagrelor (BRILINTA) 60 MG TABS tablet Take 1 tablet (60 mg total) by mouth 2 (two) times daily. 12/16/18   Clapacs, Jackquline Denmark, MD     Jovita Kussmaul, AGACNP-BC  Pulmonary & Critical Care  Pgr: [phone]  PCCM Pgr: [phone]

## 2019-02-05 DIAGNOSIS — F10231 Alcohol dependence with withdrawal delirium: Secondary | ICD-10-CM

## 2019-02-05 LAB — BASIC METABOLIC PANEL
Anion gap: 11 (ref 5–15)
Anion gap: 12 (ref 5–15)
Anion gap: 9 (ref 5–15)
BUN: 19 mg/dL (ref 6–20)
BUN: 21 mg/dL — ABNORMAL HIGH (ref 6–20)
BUN: 15 mg/dL (ref 6–20)
CO2: 26 mmol/L (ref 22–32)
CO2: 23 mmol/L (ref 22–32)
CO2: 25 mmol/L (ref 22–32)
Calcium: 8.5 mg/dL — ABNORMAL LOW (ref 8.9–10.3)
Calcium: 8.4 mg/dL — ABNORMAL LOW (ref 8.9–10.3)
Calcium: 8 mg/dL — ABNORMAL LOW (ref 8.9–10.3)
Chloride: 82 mmol/L — ABNORMAL LOW (ref 98–111)
Chloride: 86 mmol/L — ABNORMAL LOW (ref 98–111)
Chloride: 88 mmol/L — ABNORMAL LOW (ref 98–111)
Creatinine, Ser: 0.97 mg/dL (ref 0.61–1.24)
Creatinine, Ser: 0.89 mg/dL (ref 0.61–1.24)
Creatinine, Ser: 0.84 mg/dL (ref 0.61–1.24)
GFR calc Af Amer: >60 mL/min (ref >60)
GFR calc Af Amer: >60 mL/min (ref >60)
GFR calc Af Amer: >60 mL/min (ref >60)
GFR calc non Af Amer: >60 mL/min (ref >60)
GFR calc non Af Amer: >60 mL/min (ref >60)
GFR calc non Af Amer: >60 mL/min (ref >60)
Glucose, Bld: 134 mg/dL — ABNORMAL HIGH (ref 70–99)
Glucose, Bld: 119 mg/dL — ABNORMAL HIGH (ref 70–99)
Glucose, Bld: 104 mg/dL — ABNORMAL HIGH (ref 70–99)
Potassium: 3.5 mmol/L (ref 3.5–5.1)
Potassium: 4 mmol/L (ref 3.5–5.1)
Potassium: 3.3 mmol/L — ABNORMAL LOW (ref 3.5–5.1)
Sodium: 119 mmol/L — CL (ref 135–145)
Sodium: 121 mmol/L — ABNORMAL LOW (ref 135–145)
Sodium: 122 mmol/L — ABNORMAL LOW (ref 135–145)

## 2019-02-05 LAB — CK: Total CK: 893 U/L — ABNORMAL HIGH (ref 49–397)

## 2019-02-05 LAB — CBC
HCT: 23.9 % — ABNORMAL LOW (ref 39.0–52.0)
Hemoglobin: 8.6 g/dL — ABNORMAL LOW (ref 13.0–17.0)
MCH: 33.3 pg (ref 26.0–34.0)
MCHC: 36 g/dL (ref 30.0–36.0)
MCV: 92.6 fL (ref 80.0–100.0)
Platelets: 163 10*3/uL (ref 150–400)
RBC: 2.58 MIL/uL — ABNORMAL LOW (ref 4.22–5.81)
RDW: 12.9 % (ref 11.5–15.5)
WBC: 11.7 10*3/uL — ABNORMAL HIGH (ref 4.0–10.5)
nRBC: 0 % (ref 0.0–0.2)

## 2019-02-05 LAB — HEMOGLOBIN A1C
Hgb A1c MFr Bld: 5.3 % (ref 4.8–5.6)
Mean Plasma Glucose: 105.41 mg/dL

## 2019-02-05 LAB — PHOSPHORUS: Phosphorus: 2.5 mg/dL (ref 2.5–4.6)

## 2019-02-05 LAB — MAGNESIUM: Magnesium: 2.2 mg/dL (ref 1.7–2.4)

## 2019-02-05 MED ORDER — CHLORHEXIDINE GLUCONATE 0.12 % MT SOLN
15.0000 mL | Freq: Two times a day (BID) | OROMUCOSAL | Status: DC
  Administered 2019-02-05 – 2019-02-07 (×3): 15 mL via OROMUCOSAL
  Filled 2019-02-05 (×3): qty 15

## 2019-02-05 MED ORDER — ORAL CARE MOUTH RINSE
15.0000 mL | Freq: Two times a day (BID) | OROMUCOSAL | Status: DC
  Administered 2019-02-05 – 2019-02-07 (×4): 15 mL via OROMUCOSAL

## 2019-02-05 MED ORDER — FOLIC ACID 5 MG/ML IJ SOLN
1.0000 mg | Freq: Every day | INTRAMUSCULAR | Status: AC
  Administered 2019-02-05 – 2019-02-07 (×3): 1 mg via INTRAVENOUS
  Filled 2019-02-05 (×3): qty 0.2

## 2019-02-05 MED ORDER — SODIUM CHLORIDE 0.9 % IV SOLN
INTRAVENOUS | Status: DC
  Administered 2019-02-05: 1000 mL via INTRAVENOUS

## 2019-02-05 MED ORDER — FAMOTIDINE IN NACL 20-0.9 MG/50ML-% IV SOLN
20.0000 mg | Freq: Two times a day (BID) | INTRAVENOUS | Status: DC
  Administered 2019-02-05 – 2019-02-06 (×3): 20 mg via INTRAVENOUS
  Filled 2019-02-05 (×3): qty 50

## 2019-02-05 MED ORDER — SODIUM CHLORIDE 0.9 % IV SOLN
INTRAVENOUS | Status: DC
  Administered 2019-02-05: 15:00:00 via INTRAVENOUS

## 2019-02-05 NOTE — Progress Notes (Signed)
Dr. Malvin Johns notified of pt's lack of voiding today.  Bladder scan >192cc. Pt denies pressure to void and has attempted x 2.  Will monitor and re-scan if no void by 6p.

## 2019-02-05 NOTE — Progress Notes (Signed)
SLP Cancellation Note  Patient Details Name: Dustin Velazquez MRN: [id number] DOB: [birth year]   Cancelled treatment:       Reason Eval/Treat Not Completed: Fatigue/lethargy limiting ability to participate. New orders for BSE received. Chart review indicates pt had increased confusion/delerium last night, and fell when trying to get out of bed. Precedex started, and is currently being weaned. However, pt is insufficiently arousable for reassessment of swallow function and safety at this time. NP and RN do not report difficulty swallowing yesterday. Re-order requested due to sedating medications. Will continue efforts to re-evaluate when pt more appropriate.  Orman Matsumura B. [facility], [place] Speech Language Pathologist [phone]  Shonna Chock 02/05/2019, 10:54 AM

## 2019-02-05 NOTE — Evaluation (Addendum)
Physical Therapy Evaluation Patient Details Name: Dustin Velazquez MRN: [id number] DOB: [birth year] Today's Date: 02/05/2019   History of Present Illness  Pt adm after fall with hyponatremia and medial bilateral SAH. Course complicated by delirium and ETOH withdrawal. Pt with in hospital fall on 7/30. PMH - etoh abuse, HTN, CAD  Clinical Impression  Pt admitted with above diagnosis and presents to PT with functional limitations due to deficits listed below (See PT problem list). Pt needs skilled PT to maximize independence and safety to allow discharge to ST-SNF unless pt progresses quickly or has assist at home.      Follow Up Recommendations SNF;Supervision/Assistance - 24 hour    Equipment Recommendations  Other (comment)(To be assessed)    Recommendations for Other Services       Precautions / Restrictions Precautions Precautions: Fall;Other (comment) Precaution Comments: check orthostatics      Mobility  Bed Mobility Overal bed mobility: Needs Assistance Bed Mobility: Supine to Sit;Sit to Supine     Supine to sit: Mod assist;HOB elevated Sit to supine: Mod assist   General bed mobility comments: Assist to bring legs off of bed, elevate trunk into sitting, and bring hips to EOB. Assist to lower trunk and bring legs up into bed  Transfers                 General transfer comment: Did not attempt with 1 person assist. Pt light headed in sitting  Ambulation/Gait                Stairs            Wheelchair Mobility    Modified Rankin (Stroke Patients Only)       Balance Overall balance assessment: Needs assistance Sitting-balance support: Bilateral upper extremity supported;Feet unsupported Sitting balance-Leahy Scale: Poor Sitting balance - Comments: UE support and min guard to sit EOB x 8 minutes. Pt reported being light headed and requested return to supine                                     Pertinent Vitals/Pain Pain  Assessment: No/denies pain    Home Living Family/patient expects to be discharged to:: Private residence Living Arrangements: Alone Available Help at Discharge: Other (Comment)(Pt states someone can help but doesn't specify) Type of Home: Apartment Home Access: Stairs to enter Entrance Stairs-Rails: Right Entrance Stairs-Number of Steps: 2-3 Home Layout: One level Home Equipment: None      Prior Function Level of Independence: Independent               Hand Dominance   Dominant Hand: Right    Extremity/Trunk Assessment   Upper Extremity Assessment Upper Extremity Assessment: Defer to OT evaluation    Lower Extremity Assessment Lower Extremity Assessment: Generalized weakness       Communication   Communication: No difficulties  Cognition Arousal/Alertness: Lethargic Behavior During Therapy: Flat affect Overall Cognitive Status: Impaired/Different from baseline Area of Impairment: Memory;Following commands;Safety/judgement;Problem solving                     Memory: Decreased short-term memory Following Commands: Follows one step commands with increased time Safety/Judgement: Decreased awareness of safety;Decreased awareness of deficits   Problem Solving: Slow processing;Decreased initiation;Requires verbal cues        General Comments      Exercises     Assessment/Plan    PT Assessment Patient  needs continued PT services  PT Problem List Decreased strength;Decreased activity tolerance;Decreased balance;Decreased mobility;Decreased cognition;Decreased safety awareness       PT Treatment Interventions DME instruction;Gait training;Functional mobility training;Therapeutic activities;Therapeutic exercise;Balance training;Patient/family education;Cognitive remediation    PT Goals (Current goals can be found in the Care Plan section)  Acute Rehab PT Goals Patient Stated Goal: back to normal PT Goal Formulation: With patient Time For Goal  Achievement: 02/19/19 Potential to Achieve Goals: Good    Frequency Min 3X/week   Barriers to discharge Decreased caregiver support lives alone    Co-evaluation               AM-PAC PT "6 Clicks" Mobility  Outcome Measure Help needed turning from your back to your side while in a flat bed without using bedrails?: A Lot Help needed moving from lying on your back to sitting on the side of a flat bed without using bedrails?: A Lot Help needed moving to and from a bed to a chair (including a wheelchair)?: Total Help needed standing up from a chair using your arms (e.g., wheelchair or bedside chair)?: Total Help needed to walk in hospital room?: Total Help needed climbing 3-5 steps with a railing? : Total 6 Click Score: 8    End of Session Equipment Utilized During Treatment: Oxygen Activity Tolerance: Patient limited by fatigue;Other (comment)(light headed) Patient left: in bed;with call bell/phone within reach;with bed alarm set Nurse Communication: Mobility status PT Visit Diagnosis: Other abnormalities of gait and mobility (R26.89);Muscle weakness (generalized) (M62.81);History of falling (Z91.81)    Time: 1529-1550 PT Time Calculation (min) (ACUTE ONLY): 21 min   Charges:   PT Evaluation $PT Eval Moderate Complexity: [place] Pager [phone] Office Angie 02/05/2019, 4:03 PM

## 2019-02-05 NOTE — Progress Notes (Signed)
eLink Physician-Brief Progress Note Patient Name: Dustin Velazquez DOB: 05-01-1961 MRN: [id number]   Date of Service  02/05/2019  HPI/Events of Note  Hyponatremia - Na+ = 120 --> 119.  eICU Interventions  Will order: 1. 0.9 NaCl IV infusion at 50 mL/hour. 2. Continue to trend Na+ Q 8 hours.      Intervention Category Major Interventions: Electrolyte abnormality - evaluation and management  Chi Garlow Eugene 02/05/2019, 3:40 AM

## 2019-02-05 NOTE — Progress Notes (Signed)
NAME:  Dustin Velazquez, MRN:  [id number], DOB:  Feb 17, 1961, LOS: 2 ADMISSION DATE:  02/03/2019, CONSULTATION DATE:  02/03/2019 REFERRING MD:  Darl Householder - EM, CHIEF COMPLAINT:  Fall. SAH, Hyponatremia  Brief History   58 yo M EtOH abuse, found to be hyponatremic 118 and have SAH. Neurosurgery Consulted.   History of present illness   History obtained from chart   58 yo M PMH EtOH abuse (last relapse 7/4), tobacco use, GERD, HTN, HLD, MDD, NSTEMI with stent placement (on Brilinta?), who presents 7/29 after sustaining a fall 7/28 at night. The patient was reportedly exiting shower, fell and hit head on toilet seat. Patient endorses + LOC. Patient reportedly fell again due to dizziness/LOC. Patient states he takes Brilinta for heart stents. Patient denies recent EtOH use, citing last use approx 3 weeks ago. Of note, patient discharged from ED with short course of Librium on 7/5, following by Dr. Roxan Hockey. Appears patient has missed follow up appointments the past several days, with telephone encounter notes citing nausea, diarrhea.  Depression reportedly worse in setting of marital separation approx 6 months ago. Patient endorses weight loss and poor appetite. Patient denies SOB, chest pain, night sweats.  In ED patient taken for CT head, maxillofacial, and Cspine. CT head reveals SAH. CT Mf and Cspine unremarkable. ED reportedly has consulted NSGY and patient is deemed not surgical candidate. NSGY requesting q1hr neurochecks. PCCM to admit.   Other significant ED findings include Na 118 and Cr 1.8. DDAVP given in ED for Brilinta, and isotonic saline started for chronic hyponatremia.    Past Medical History  EtOH abuse (last reported use 7/4, Librium initiated 7/5)  NSTEMI with stent placement, on Brilinta HTN MDD Anxiety Tobacco use disorder  [place] Events   7/29 presents to ED. Admitted to ICU  Consults:  NSGY  Procedures:    Significant Diagnostic Tests:  7/29 CT  Head non con > SAH of medial bilateral frontal lobes. L Parietal R occipital hematomas.  7/29 CT maxillofacial, C spine non con> No acute skull base or facial bone fracture. Mild disc space height loss C5-C7. No fracture or sublax of cervical spine.  7/29 XR L shoulder> no acute fracture or dislocation 7/30 CT Head > Decreasing SAH   Micro Data:  7/29 SARS CoV2 > negative    Antimicrobials:  None  Interim history/subjective:  Remains on Precedex at 0.6, obtunded and hard to arouse Bradycardia and BP low 02'V systolic  Afebrile   Objective   Blood pressure (!) 81/55, pulse (!) 55, temperature (!) 96.2 F (35.7 C), temperature source Axillary, resp. rate 14, height 5\' 9"  (1.753 m), weight 80 kg, SpO2 100 %.        Intake/Output Summary (Last 24 hours) at 02/05/2019 0900 Last data filed at 02/05/2019 0600 Gross per 24 hour  Intake 1042.81 ml  Output 1000 ml  Net 42.81 ml   Filed Weights   02/03/19 1522 02/04/19 0500 02/05/19 0426  Weight: 79.8 kg 79 kg 80 kg    Examination: General: Adult male, no distress, sedated on precedex HENT: pupils intact, bruising noted to face, hematoma to back of head  Lungs: Bilateral chest excursion, Clear breath sounds with few rhonchi, no wheeze/crackles  Cardiovascular: S1, S2, RRR, no MRG Abdomen: soft, non-tender, active bowel sounds  Extremities: -edema, scattered bruising noted throughout upper extremities, and face ( racoon eyes)  Neuro: sedated obtunded, does not follow commands, 4/5 on Left, 5/5 on right, pupils pinpoint,  reactive  GU: Foley in place   Resolved Hospital Problem list     Assessment & Plan:   SDH in medial bilateral frontal lobes and falx s/p fall, on Brilinta/ASA Sedated 7/31 am on precedex Fall 7/30>> CT imaging is stable Decreased LOC suspect 2/2 precedex Plan - Neurosurgery consulted. No surgical intervention for now.  - Pt received DDAVP 0.53mck/kg in ED. - Neuro checks  - Minimize sedation as able -  Bedside Swallow Evaluation when awake - SP/OT/PT   CAD s/p Stent to Left Circumflex  H/O HTN, HLD Hypotension 7/31`, suspect sedation related Plan -Per EMR review, visit with cardiology Lorine Bears, MD) 03/05/18 plan was   to continue Brilinta for one year then stop. >> Will not restart upon D/C  -Hold Brilinta/ASA in setting of SDH -Continue Crestor -Hold Metoprolol ( Parameters added) Lisinopril, Hydralazine for now   Hypovolemic Hyponatremia-likely acute on chronic, c/b dehydration. DDAVP in ED of some secondary benefit.   Acute Kidney Injury, Rhabdomyolysis  Plan -Trend BMP > Correcting and rising at stable rate, currently not on fluids  -Trend CK > Down trending -Avoid nephrotoxins  Alcohol dependence and abuse. Last reported intake January 09, 2019. On 7/5 started short course librium H/O Depression  Plan - Wean precedex as able -CIWA  -Thiamine, MVI, folic acid  -Continue home Zoloft   GERD Plan -Protonix   Best practice:  Diet: ST evaluation > NPO for now GI prophylaxis: continue home PPI  Mobility: PT ordered / OT Code Status: FULL  Family Communication:   Disposition: ICU while on precedex   Labs   CBC: Last Labs      Recent Labs  Lab 02/03/19 1530  WBC 12.5*  NEUTROABS 11.0*  HGB 11.3*  HCT 31.6*  MCV 91.9  PLT 221      Basic Metabolic Panel: Last Labs      Recent Labs  Lab 02/03/19 1530  NA 118*  K 4.1  CL 75*  CO2 20*  GLUCOSE 138*  BUN 21*  CREATININE 1.80*  CALCIUM 8.6*     GFR: Estimated Creatinine Clearance: 44.7 mL/min (A) (by C-G formula based on SCr of 1.8 mg/dL (H)). Last Labs      Recent Labs  Lab 02/03/19 1530  WBC 12.5*      Liver Function Tests: Last Labs      Recent Labs  Lab 02/03/19 1530  AST 143*  ALT 116*  ALKPHOS 65  BILITOT 1.9*  PROT 6.3*  ALBUMIN 3.6     Last Labs   No results for input(s): LIPASE, AMYLASE in the last 168 hours.   Last Labs   No results for input(s): AMMONIA in  the last 168 hours.    ABG Labs (Brief)  No results found for: PHART, PCO2ART, PO2ART, HCO3, TCO2, ACIDBASEDEF, O2SAT     Coagulation Profile: Last Labs      Recent Labs  Lab 02/03/19 1700  INR 1.0      Cardiac Enzymes: Last Labs   No results for input(s): CKTOTAL, CKMB, CKMBINDEX, TROPONINI in the last 168 hours.    HbA1C: Last Labs  No results found for: HGBA1C    CBG: Last Labs   No results for input(s): GLUCAP in the last 168 hours.       Past Medical History  He,  has a past medical history of ETOH abuse, GERD (gastroesophageal reflux disease), Hypercholesteremia, Hypertension, and Sleep walking disorder.   Surgical History    Past Surgical History:  Procedure  Laterality Date  . APPENDECTOMY    . CORONARY STENT INTERVENTION N/A 12/30/2016   Procedure: Coronary Stent Intervention;  Surgeon: Iran OuchArida, Muhammad A, MD;  Location: [facility] INVASIVE CV LAB;  Service: Cardiovascular;  Laterality: N/A;  . KNEE SURGERY    . LEFT HEART CATH AND CORONARY ANGIOGRAPHY N/A 12/30/2016   Procedure: Left Heart Cath and Coronary Angiography;  Surgeon: Iran OuchArida, Muhammad A, MD;  Location: [facility] INVASIVE CV LAB;  Service: Cardiovascular;  Laterality: N/A;     Social History   reports that he has been smoking cigarettes. He has a 7.50 pack-year smoking history. He has never used smokeless tobacco. He reports current alcohol use of about 8.0 standard drinks of alcohol per week. He reports that he does not use drugs.   Family History   His family history includes Hyperlipidemia in his father; Hypertension in his mother.   Allergies Allergies  Allergen Reactions  . Compazine [Prochlorperazine Edisylate] Other (See Comments)    Seizures      Home Medications  Prior to Admission medications   Medication Sig Start Date End Date Taking? Authorizing Provider  aspirin EC 81 MG tablet Take 1 tablet (81 mg total) by mouth daily. 12/16/18   Clapacs, Jackquline DenmarkJohn T, MD  hydrochlorothiazide  (HYDRODIURIL) 25 MG tablet Take 1 tablet (25 mg total) by mouth daily. 01/25/19   Jamelle HaringHendrickson, Clifford D, MD  lisinopril (ZESTRIL) 40 MG tablet Take 1 tablet (40 mg total) by mouth daily. 12/16/18   Clapacs, Jackquline DenmarkJohn T, MD  metoprolol tartrate (LOPRESSOR) 50 MG tablet Take 1 tablet (50 mg total) by mouth 2 (two) times daily. 12/16/18   Clapacs, Jackquline DenmarkJohn T, MD  naltrexone (DEPADE) 50 MG tablet Take 50 mg by mouth daily.  12/17/18   [provider]  omeprazole (PRILOSEC) 20 MG capsule Take 20 mg by mouth 2 (two) times a day. 01/18/19   [provider]  pantoprazole (PROTONIX) 40 MG tablet Take 1 tablet (40 mg total) by mouth daily. 12/17/18   Clapacs, Jackquline DenmarkJohn T, MD  rosuvastatin (CRESTOR) 40 MG tablet Take 1 tablet (40 mg total) by mouth daily. Patient taking differently: Take 40 mg by mouth every evening.  12/16/18 03/16/19  Clapacs, Jackquline DenmarkJohn T, MD  sertraline (ZOLOFT) 50 MG tablet Take 50 mg by mouth daily.  12/21/18   [provider]  ticagrelor (BRILINTA) 60 MG TABS tablet Take 1 tablet (60 mg total) by mouth 2 (two) times daily. 12/16/18   Clapacs, Jackquline DenmarkJohn T, MD     Bevelyn NgoSarah F. Inri Sobieski, MSN, AGACNP-BC [facility]  Pgr: [phone],  After 4 pm call  PCCM Pgr: [phone] 02/05/2019 9:01 AM

## 2019-02-05 NOTE — Telephone Encounter (Signed)
Pt is INPT

## 2019-02-06 ENCOUNTER — Inpatient Hospital Stay (HOSPITAL_COMMUNITY): Payer: 59

## 2019-02-06 LAB — BASIC METABOLIC PANEL
Anion gap: 10 (ref 5–15)
Anion gap: 10 (ref 5–15)
BUN: 13 mg/dL (ref 6–20)
BUN: 11 mg/dL (ref 6–20)
CO2: 26 mmol/L (ref 22–32)
CO2: 24 mmol/L (ref 22–32)
Calcium: 8.3 mg/dL — ABNORMAL LOW (ref 8.9–10.3)
Calcium: 8.7 mg/dL — ABNORMAL LOW (ref 8.9–10.3)
Chloride: 90 mmol/L — ABNORMAL LOW (ref 98–111)
Chloride: 96 mmol/L — ABNORMAL LOW (ref 98–111)
Creatinine, Ser: 0.85 mg/dL (ref 0.61–1.24)
Creatinine, Ser: 0.82 mg/dL (ref 0.61–1.24)
GFR calc Af Amer: >60 mL/min (ref >60)
GFR calc Af Amer: >60 mL/min (ref >60)
GFR calc non Af Amer: >60 mL/min (ref >60)
GFR calc non Af Amer: >60 mL/min (ref >60)
Glucose, Bld: 112 mg/dL — ABNORMAL HIGH (ref 70–99)
Glucose, Bld: 106 mg/dL — ABNORMAL HIGH (ref 70–99)
Potassium: 3.3 mmol/L — ABNORMAL LOW (ref 3.5–5.1)
Potassium: 3.3 mmol/L — ABNORMAL LOW (ref 3.5–5.1)
Sodium: 126 mmol/L — ABNORMAL LOW (ref 135–145)
Sodium: 130 mmol/L — ABNORMAL LOW (ref 135–145)

## 2019-02-06 LAB — CBC
HCT: 23.2 % — ABNORMAL LOW (ref 39.0–52.0)
Hemoglobin: 8.2 g/dL — ABNORMAL LOW (ref 13.0–17.0)
MCH: 33.7 pg (ref 26.0–34.0)
MCHC: 35.3 g/dL (ref 30.0–36.0)
MCV: 95.5 fL (ref 80.0–100.0)
Platelets: 189 10*3/uL (ref 150–400)
RBC: 2.43 MIL/uL — ABNORMAL LOW (ref 4.22–5.81)
RDW: 13 % (ref 11.5–15.5)
WBC: 11.3 10*3/uL — ABNORMAL HIGH (ref 4.0–10.5)
nRBC: 0 % (ref 0.0–0.2)

## 2019-02-06 LAB — CK: Total CK: 391 U/L (ref 49–397)

## 2019-02-06 MED ORDER — LORAZEPAM 2 MG/ML IJ SOLN
1.0000 mg | Freq: Four times a day (QID) | INTRAMUSCULAR | Status: DC | PRN
  Administered 2019-02-06: 1 mg via INTRAVENOUS
  Filled 2019-02-06: qty 1

## 2019-02-06 MED ORDER — POTASSIUM CHLORIDE 10 MEQ/100ML IV SOLN
10.0000 meq | INTRAVENOUS | Status: AC
  Administered 2019-02-06 (×2): 10 meq via INTRAVENOUS
  Filled 2019-02-06 (×2): qty 100

## 2019-02-06 MED ORDER — LORAZEPAM 1 MG PO TABS: 1.0000 mg | ORAL_TABLET | Freq: Four times a day (QID) | ORAL | Status: DC | PRN

## 2019-02-06 MED ORDER — LORAZEPAM 1 MG PO TABS
1.0000 mg | ORAL_TABLET | ORAL | Status: DC | PRN
  Administered 2019-02-06 – 2019-02-07 (×3): 2 mg via ORAL
  Filled 2019-02-06 (×3): qty 2

## 2019-02-06 MED ORDER — ACETAMINOPHEN 325 MG PO TABS
650.0000 mg | ORAL_TABLET | Freq: Four times a day (QID) | ORAL | Status: DC | PRN
  Administered 2019-02-06 – 2019-02-10 (×3): 650 mg via ORAL
  Filled 2019-02-06 (×3): qty 2

## 2019-02-06 MED ORDER — LORAZEPAM 2 MG/ML IJ SOLN
1.0000 mg | INTRAMUSCULAR | Status: DC | PRN
  Administered 2019-02-07 (×2): 2 mg via INTRAVENOUS
  Filled 2019-02-06 (×3): qty 1

## 2019-02-06 NOTE — Progress Notes (Signed)
Sinus tachycardia noted to 130bpm. Paged [place] CCM, will give held dose of metoprolol and continue to monitor.

## 2019-02-06 NOTE — Progress Notes (Signed)
  Speech Language Pathology Treatment: Dysphagia  Patient Details Name: OBADIAH DENNARD MRN: [id number] DOB: 06-Sep-1960 Today's Date: 02/06/2019 Time: 2951-8841 SLP Time Calculation (min) (ACUTE ONLY): 19 min  Assessment / Plan / Recommendation Clinical Impression  Pt is alert this morning but reports feeling very weak, like he is depleted of energy. Per RN, there was concern for aspiration on previous date when pt was more sedated (on precedex). SLP provided set-up assist including cutting of food into bite-sized pieces in order to conserve energy. Once this was done, pt self-fed bites of regular solids and thin liquids with no overt signs of dysphagia or aspiration. Education was provided about the importance of energy conservation as it pertains to swallowing safety. Would continue with regular solids and thin liquids with intermittent supervision, including tray set-up.    HPI HPI: 58 yo M admitted 02/03/2019, fell and hit his head upon exiting the shower. Pt found to be hyponatremic 118 and have SAH. PMH: EtOH abuse, NSTEMI with stent placement, HTN, MDD, Anxiety, Tobacco use disorder, HLD. Head CT = Decreasing subarachnoid hemorrhage in the interhemisphericfissure.      SLP Plan  Continue with current plan of care       Recommendations  Diet recommendations: Regular;Thin liquid Liquids provided via: Cup;Straw Medication Administration: Whole meds with liquid Supervision: Patient able to self feed;Intermittent supervision to cue for compensatory strategies(set-up assist) Compensations: Minimize environmental distractions;Slow rate;Small sips/bites Postural Changes and/or Swallow Maneuvers: Seated upright 90 degrees                Oral Care Recommendations: Oral care BID Follow up Recommendations: 24 hour supervision/assistance SLP Visit Diagnosis: Dysphagia, unspecified (R13.10) Plan: Continue with current plan of care       GO                Venita Sheffield Mylez Venable 02/06/2019,  9:03 AM  Pollyann Glen, M.A. [facility] Environmental education officer [phone] Office [phone]

## 2019-02-06 NOTE — Plan of Care (Signed)
Pt progressing - tolerating diet and fluids - on Room air O2 Sats 97%

## 2019-02-06 NOTE — Progress Notes (Addendum)
NAME:  Dustin Velazquez, MRN:  [id number], DOB:  April 07, 1961, LOS: 3 ADMISSION DATE:  02/03/2019, CONSULTATION DATE:  02/03/2019 REFERRING MD:  Darl Householder - EM, CHIEF COMPLAINT:  Fall. SAH, Hyponatremia  Brief History   58 yo M EtOH abuse, found to be hyponatremic 118 and have SAH. Neurosurgery Consulted.   History of present illness   History obtained from chart   58 yo M PMH EtOH abuse (last relapse 7/4), tobacco use, GERD, HTN, HLD, MDD, NSTEMI with stent placement (on Brilinta?), who presents 7/29 after sustaining a fall 7/28 at night. The patient was reportedly exiting shower, fell and hit head on toilet seat. Patient endorses + LOC. Patient reportedly fell again due to dizziness/LOC. Patient states he takes Brilinta for heart stents. Patient denies recent EtOH use, citing last use approx 3 weeks ago. Of note, patient discharged from ED with short course of Librium on 7/5, following by Dr. Roxan Hockey. Appears patient has missed follow up appointments the past several days, with telephone encounter notes citing nausea, diarrhea.  Depression reportedly worse in setting of marital separation approx 6 months ago. Patient endorses weight loss and poor appetite. Patient denies SOB, chest pain, night sweats.  In ED patient taken for CT head, maxillofacial, and Cspine. CT head reveals SAH. CT Mf and Cspine unremarkable. ED reportedly has consulted NSGY and patient is deemed not surgical candidate. NSGY requesting q1hr neurochecks. PCCM to admit.   Other significant ED findings include Na 118 and Cr 1.8. DDAVP given in ED for Brilinta, and isotonic saline started for chronic hyponatremia.    Past Medical History  EtOH abuse (last reported use 7/4, Librium initiated 7/5)  NSTEMI with stent placement, on Brilinta HTN MDD Anxiety Tobacco use disorder  [place] Events   7/29 presents to ED. Admitted to ICU  Consults:  NSGY  Procedures:    Significant Diagnostic Tests:  7/29 CT  Head non con > SAH of medial bilateral frontal lobes. L Parietal R occipital hematomas.  7/29 CT maxillofacial, C spine non con> No acute skull base or facial bone fracture. Mild disc space height loss C5-C7. No fracture or sublax of cervical spine.  7/29 XR L shoulder> no acute fracture or dislocation 7/30 CT Head > Decreasing SAH   Micro Data:  7/29 SARS CoV2 > negative    Antimicrobials:  None  Interim history/subjective:  Off precedex, no agitation reported Passed swallow eval and starting a diet  Objective   Blood pressure (!) 107/58, pulse 82, temperature 98.6 F (37 C), temperature source Oral, resp. rate (!) 28, height 5\' 9"  (1.753 m), weight 66.9 kg, SpO2 94 %.        Intake/Output Summary (Last 24 hours) at 02/06/2019 1209 Last data filed at 02/06/2019 0918 Gross per 24 hour  Intake 2406.89 ml  Output 550 ml  Net 1856.89 ml   Filed Weights   02/04/19 0500 02/05/19 0426 02/06/19 0352  Weight: 79 kg 80 kg 66.9 kg    Examination: General: Awake, alert, comfortable.  HENT: Bruising periorbital, hematoma posterior head, PERRL Lungs: clear B  Cardiovascular: regular, no M, no edema Abdomen: soft, NT, + BS Extremities: -edema, scattered bruising noted throughout upper extremities, and face ( racoon eyes)  Neuro: Awake, alert and oriented, no evidence agitation, no tremor GU: Foley in place   Resolved Hospital Problem list     Assessment & Plan:   SDH in medial bilateral frontal lobes and falx s/p fall, on Brilinta/ASA Fall (here)  7/30>> CT imaging remains stable Plan - appreciate NSGY eval, CT head stable, no intervention to make.  - Neuro checks  - Brilinta stopped as below - SP/OT/PT   CAD s/p Stent to Left Circumflex  H/O HTN, HLD Hypotension 7/31`, suspect sedation related Plan -Per EMR review, visit with cardiology Lorine Bears, MD) 03/05/18 plan was   to continue Brilinta for one year then stop. >> Will not restart upon D/C  -probably restart ASA  this admit when tolerating PO -continue crestor -metoprolol restarted  -add back lisinopril, hydralazine going forward as BP increases  Hypovolemic Hyponatremia-likely acute on chronic, c/b dehydration. DDAVP in ED of some secondary benefit.   Acute Kidney Injury, Rhabdomyolysis  Plan -Improved, following BMP and CK  Alcohol dependence and abuse. Last reported intake January 09, 2019. On 7/5 started short course librium H/O Depression  Plan -off precedex -thiamine, folate -does not appear to need CIWA monitoring at this time.   GERD Plan -restart PPI   Best practice:  Diet: start regular diet GI prophylaxis: restart PPI  Mobility: PT ordered / OT Code Status: FULL  Family Communication:   Disposition:to floor bed, to [facility] as of 8/2   Levy Pupa, MD, PhD 02/06/2019, 12:20 PM [facility] [phone] or if no answer [phone]

## 2019-02-06 NOTE — Progress Notes (Signed)
[facility] ADULT ICU REPLACEMENT PROTOCOL FOR AM LAB REPLACEMENT ONLY  The patient does apply for the [facility] Adult ICU Electrolyte Replacment Protocol based on the criteria listed below:   1. Is GFR >/= 40 ml/min? Yes.    Patient's GFR today is >60 2. Is urine output >/= 0.5 ml/kg/hr for the last 6 hours? Yes.   Patient's UOP is .7 ml/kg/hr 3. Is BUN < 60 mg/dL? Yes.    Patient's BUN today is 13 4. Abnormal electrolyte(s): K-3.3 5. Ordered repletion with: per protcocol 6. If a panic level lab has been reported, has the CCM MD in charge been notified? Yes.  .   Physician:  Dr. Terrill Mohr, Philis Nettle 02/06/2019 5:59 AM

## 2019-02-07 LAB — BASIC METABOLIC PANEL
Anion gap: 10 (ref 5–15)
BUN: 8 mg/dL (ref 6–20)
CO2: 26 mmol/L (ref 22–32)
Calcium: 8.9 mg/dL (ref 8.9–10.3)
Chloride: 95 mmol/L — ABNORMAL LOW (ref 98–111)
Creatinine, Ser: 0.74 mg/dL (ref 0.61–1.24)
GFR calc Af Amer: >60 mL/min (ref >60)
GFR calc non Af Amer: >60 mL/min (ref >60)
Glucose, Bld: 106 mg/dL — ABNORMAL HIGH (ref 70–99)
Potassium: 3 mmol/L — ABNORMAL LOW (ref 3.5–5.1)
Sodium: 131 mmol/L — ABNORMAL LOW (ref 135–145)

## 2019-02-07 MED ORDER — POTASSIUM CHLORIDE CRYS ER 20 MEQ PO TBCR
30.0000 meq | EXTENDED_RELEASE_TABLET | ORAL | Status: AC
  Administered 2019-02-07 (×2): 30 meq via ORAL
  Filled 2019-02-07 (×2): qty 2

## 2019-02-07 MED ORDER — POLYETHYLENE GLYCOL 3350 17 G PO PACK
17.0000 g | PACK | Freq: Every day | ORAL | Status: DC
  Administered 2019-02-07 – 2019-02-10 (×3): 17 g via ORAL
  Filled 2019-02-07 (×4): qty 1

## 2019-02-07 MED ORDER — PANTOPRAZOLE SODIUM 40 MG PO TBEC
40.0000 mg | DELAYED_RELEASE_TABLET | Freq: Every day | ORAL | Status: DC
  Administered 2019-02-07 – 2019-02-10 (×4): 40 mg via ORAL
  Filled 2019-02-07 (×5): qty 1

## 2019-02-07 MED ORDER — NICOTINE 21 MG/24HR TD PT24
21.0000 mg | MEDICATED_PATCH | Freq: Every day | TRANSDERMAL | Status: DC
  Administered 2019-02-07 – 2019-02-10 (×4): 21 mg via TRANSDERMAL
  Filled 2019-02-07 (×4): qty 1

## 2019-02-07 NOTE — Progress Notes (Signed)
PROGRESS NOTE    Dustin Velazquez  ONG:[id number] DOB: 1960-08-06 DOA: 02/03/2019 PCP: Jamelle HaringHendrickson, Clifford D, MD    Brief Narrative:  58 year old gentleman with history of alcohol abuse, last relapse 7/4, tobacco use, GERD, hypertension, hyperlipidemia, non-STEMI with stent placement on 12/2016 on aspirin and Brilinta who presented to the emergency room on 7/29 after sustaining a fall the night before.  Patient reported that he was exiting in his shower and fell on the front of his head and hit on toilet seat.  Patient endorsed transient loss of consciousness.  He was feeling dizzy and falling again so came to the emergency room after calling EMS.  He has been on and off drinking alcohol.  In the emergency room, a CT head, maxillofacial and C-spine showed subarachnoid hemorrhage without complication.  He was given DDAVP because of being on Brilinta.  Repeat CT scans are stable.  Seen by neurosurgery and recommended supportive treatment.  Also found to have sodium of 118 and creatinine of 1.8.  He was admitted to ICU and now improved.   Assessment & Plan:   Active Problems:   Hyponatremia   Subarachnoid bleed (HCC)  Traumatic subarachnoid hemorrhage bilateral frontal lobe in the setting of chronic use of aspirin and Brilinta: Day 3 in the hospital.  No neurological deficit.  Clinically improving.  Seen by neurosurgery.  Repeat CT scan is stable.  Off aspirin and Brilinta.  Will discuss with neurosurgery when is appropriate to resume aspirin.  He does not need Brilinta.  His stent was 2 years ago and does not have any recurrent chest pain.  Coronary artery disease status post stent: On beta-blockers that we will continue.  He is on Crestor that we will continue.  Will discuss with neurosurgery about aspirin tomorrow.  Hypovolemic hyponatremia: Likely acute on chronic hyponatremia.  Was treated with isotonic fluid with improvement.  Sodium is 131 today.  Acute kidney injury and rhabdomyolysis:  Improved with IV fluids.  Continue to monitor levels.  Hypokalemia: Replace and monitor levels.  Will check magnesium and phosphorus.  Alcohol dependence and abuse: Patient is stated not drinking for 3 weeks, however unreliable history.  Remains on minimum dose of benzodiazepine and CIWA protocol.  Will continue.  On multivitamins.  Patient was on Precedex in the intensive care unit and taken off.  He is on Zoloft that he will continue.  GERD: On PPI that he will continue.   DVT prophylaxis: SCDs Code Status: Full code Family Communication: None Disposition Plan: Home after hospitalization.  Can transfer to telemetry unit.   Consultants:   PCCM, signed off  Neurosurgery, signed off  Procedures:   None  Antimicrobials:   None   Subjective: Patient seen and examined.  Case discussed with intensivist.  Patient himself has some diffuse frontal headache but denies any other symptoms.  Denies any nausea vomiting chest pain.  He was able to eat.  He had some shakiness last night and was given a dose of Ativan IV otherwise he is not having any other trouble.  Objective: Vitals:   02/07/19 0900 02/07/19 1000 02/07/19 1111 02/07/19 1125  BP:  124/85 120/69   Pulse: 94 93    Resp: (!) 41 (!) 30    Temp:    98.1 F (36.7 C)  TempSrc:    Oral  SpO2: 98% 99%    Weight:      Height:        Intake/Output Summary (Last 24 hours) at 02/07/2019 1138 Last  data filed at 02/07/2019 1000 Gross per 24 hour  Intake 1450 ml  Output 2475 ml  Net -1025 ml   Filed Weights   02/05/19 0426 02/06/19 0352 02/07/19 0500  Weight: 80 kg 66.9 kg 66.9 kg    Examination:  General exam: Appears calm and comfortable, he has subconjunctival hemorrhages, ecchymosis around left eyeball and bridge of the nose. Respiratory system: Clear to auscultation. Respiratory effort normal. Cardiovascular system: S1 & S2 heard, RRR. No JVD, murmurs, rubs, gallops or clicks. No pedal edema. Gastrointestinal  system: Abdomen is nondistended, soft and nontender. No organomegaly or masses felt. Normal bowel sounds heard. Central nervous system: Alert and oriented. No focal neurological deficits. Extremities: Symmetric 5 x 5 power. Skin: No rashes, lesions or ulcers Psychiatry: Judgement and insight appear normal. Mood & affect appropriate.     Data Reviewed: I have personally reviewed following labs and imaging studies  CBC: Recent Labs  Lab 02/03/19 1530 02/03/19 2047 02/04/19 0255 02/05/19 0214 02/06/19 0156  WBC 12.5* 10.4 10.6* 11.7* 11.3*  NEUTROABS 11.0*  --  8.8*  --   --   HGB 11.3* 9.8* 9.7* 8.6* 8.2*  HCT 31.6* 27.3* 26.8* 23.9* 23.2*  MCV 91.9 91.6 91.5 92.6 95.5  PLT 221 186 192 163 189   Basic Metabolic Panel: Recent Labs  Lab 02/03/19 2047  02/04/19 1043  02/05/19 0214 02/05/19 0930 02/05/19 1807 02/06/19 0156 02/06/19 1124 02/07/19 0245  NA 120*   < > 122*   < > 119* 121* 122* 126* 130* 131*  K 3.4*   < > 3.6   < > 3.5 4.0 3.3* 3.3* 3.3* 3.0*  CL 82*   < > 83*   < > 82* 86* 88* 90* 96* 95*  CO2 22   < > 24   < > 26 23 25 26 24 26   GLUCOSE 119*   < > 119*   < > 134* 119* 104* 112* 106* 106*  BUN 17   < > 17   < > 19 21* 15 13 11 8   CREATININE 1.39*   < > 1.05   < > 0.97 0.89 0.84 0.85 0.82 0.74  CALCIUM 7.8*   < > 8.4*   < > 8.5* 8.4* 8.0* 8.3* 8.7* 8.9  MG 1.5*  --  1.8  --  2.2  --   --   --   --   --   PHOS 2.3*  --   --   --  2.5  --   --   --   --   --    < > = values in this interval not displayed.   GFR: Estimated Creatinine Clearance: 95.2 mL/min (by C-G formula based on SCr of 0.74 mg/dL). Liver Function Tests: Recent Labs  Lab 02/03/19 1530  AST 143*  ALT 116*  ALKPHOS 65  BILITOT 1.9*  PROT 6.3*  ALBUMIN 3.6   No results for input(s): LIPASE, AMYLASE in the last 168 hours. No results for input(s): AMMONIA in the last 168 hours. Coagulation Profile: Recent Labs  Lab 02/03/19 1700  INR 1.0   Cardiac Enzymes: Recent Labs  Lab  02/03/19 2047 02/04/19 0255 02/04/19 1043 02/05/19 0214 02/06/19 0156  CKTOTAL 1,973* 1,718* 1,442* 893* 391   BNP (last 3 results) No results for input(s): PROBNP in the last 8760 hours. HbA1C: Recent Labs    02/05/19 0214  HGBA1C 5.3   CBG: Recent Labs  Lab 02/04/19 0720 02/04/19 2324  GLUCAP 123*  143*   Lipid Profile: No results for input(s): CHOL, HDL, LDLCALC, TRIG, CHOLHDL, LDLDIRECT in the last 72 hours. Thyroid Function Tests: No results for input(s): TSH, T4TOTAL, FREET4, T3FREE, THYROIDAB in the last 72 hours. Anemia Panel: No results for input(s): VITAMINB12, FOLATE, FERRITIN, TIBC, IRON, RETICCTPCT in the last 72 hours. Sepsis Labs: No results for input(s): PROCALCITON, LATICACIDVEN in the last 168 hours.  Recent Results (from the past 240 hour(s))  SARS Coronavirus 2 (CEPHEID - Performed in [facility] hospital lab), Hosp Order     Status: None   Collection Time: 02/03/19  5:22 PM   Specimen: Nasopharyngeal Swab  Result Value Ref Range Status   SARS Coronavirus 2 NEGATIVE NEGATIVE Final    Comment: (NOTE) If result is NEGATIVE SARS-CoV-2 target nucleic acids are NOT DETECTED. The SARS-CoV-2 RNA is generally detectable in upper and lower  respiratory specimens during the acute phase of infection. The lowest  concentration of SARS-CoV-2 viral copies this assay can detect is 250  copies / mL. A negative result does not preclude SARS-CoV-2 infection  and should not be used as the sole basis for treatment or other  patient management decisions.  A negative result may occur with  improper specimen collection / handling, submission of specimen other  than nasopharyngeal swab, presence of viral mutation(s) within the  areas targeted by this assay, and inadequate number of viral copies  (<250 copies / mL). A negative result must be combined with clinical  observations, patient history, and epidemiological information. If result is POSITIVE SARS-CoV-2 target  nucleic acids are DETECTED. The SARS-CoV-2 RNA is generally detectable in upper and lower  respiratory specimens dur ing the acute phase of infection.  Positive  results are indicative of active infection with SARS-CoV-2.  Clinical  correlation with patient history and other diagnostic information is  necessary to determine patient infection status.  Positive results do  not rule out bacterial infection or co-infection with other viruses. If result is PRESUMPTIVE POSTIVE SARS-CoV-2 nucleic acids MAY BE PRESENT.   A presumptive positive result was obtained on the submitted specimen  and confirmed on repeat testing.  While 2019 novel coronavirus  (SARS-CoV-2) nucleic acids may be present in the submitted sample  additional confirmatory testing may be necessary for epidemiological  and / or clinical management purposes  to differentiate between  SARS-CoV-2 and other Sarbecovirus currently known to infect humans.  If clinically indicated additional testing with an alternate test  methodology [id number]) is advised. The SARS-CoV-2 RNA is generally  detectable in upper and lower respiratory sp ecimens during the acute  phase of infection. The expected result is Negative. Fact Sheet for Patients:  BoilerBrush.com.cy Fact Sheet for Healthcare Providers: https://pope.com/ This test is not yet approved or cleared by the [place] FDA and has been authorized for detection and/or diagnosis of SARS-CoV-2 by FDA under an Emergency Use Authorization (EUA).  This EUA will remain in effect (meaning this test can be used) for the duration of the COVID-19 declaration under Section 564(b)(1) of the Act, 21 U.S.C. section 360bbb-3(b)(1), unless the authorization is terminated or revoked sooner. Performed at [facility], [address], [place], [place] [address]   MRSA PCR Screening     Status: None   Collection Time: 02/03/19  8:40 PM    Specimen: Nasal Mucosa; Nasopharyngeal  Result Value Ref Range Status   MRSA by PCR NEGATIVE NEGATIVE Final    Comment:        The GeneXpert MRSA  Assay (FDA approved for NASAL specimens only), is one component of a comprehensive MRSA colonization surveillance program. It is not intended to diagnose MRSA infection nor to guide or monitor treatment for MRSA infections. Performed at [facility], [place] [address], [place], [place] [address]          Radiology Studies: Dg Chest Port 1 View  Result Date: 02/06/2019 CLINICAL DATA:  Respiratory failure EXAM: PORTABLE CHEST 1 VIEW COMPARISON:  Chest radiograph 02/03/2019 FINDINGS: Monitoring leads overlie the patient. Stable cardiac and mediastinal contours. Low lung volumes. Bibasilar heterogeneous opacities. No pleural effusion or pneumothorax. IMPRESSION: Bibasilar opacities favored to represent atelectasis. Electronically Signed   By: Lovey Newcomer M.D.   On: 02/06/2019 06:14        Scheduled Meds: . chlorhexidine  15 mL Mouth Rinse BID  . Chlorhexidine Gluconate Cloth  6 each Topical Q0600  . mouth rinse  15 mL Mouth Rinse q12n4p  . metoprolol tartrate  50 mg Oral BID  . rosuvastatin  40 mg Oral QPM  . sertraline  50 mg Oral Daily   Continuous Infusions:   LOS: 4 days    Time spent: 35 minutes    Barb Merino, MD [facility] Pager [phone]  If 7PM-7AM, please contact night-coverage [email or web address] Password [facility] 02/07/2019, 11:38 AM

## 2019-02-07 NOTE — Progress Notes (Signed)
Report called to 5W RN

## 2019-02-07 NOTE — Progress Notes (Signed)
[name] [id number]  Code Status: FULL  Admission Data: 02/07/2019 7:53 PM  Attending Provider: Dorann Ou  JOA:[id number], [name], MD  Consults/ Treatment Team:   Dustin Velazquez is a 58 y.o. male patient admitted from ED awake, alert - oriented X 4 - no acute distress noted. VSS - Blood pressure (!) 194/89, pulse 97, temperature (!) 97.4 F (36.3 C), temperature source Oral, resp. rate 20, height 5\' 9"  (1.753 m), weight 66.9 kg, SpO2 100 %. no c/o shortness of breath, no c/o chest pain. Orientation to room, and floor completed with information packet given to patient/family. Admission INP armband ID verified with patient/family, and in place.  Fall assessment complete, with patient and family able to verbalize understanding of risk associated with falls, and verbalized understanding to call nsg before up out of bed. Call light within reach, patient able to voice, and demonstrate understanding.   No evidence of skin break down noted on exam. Significant amount of bruising and small abrasions noted to left face, head, right hip, and scattered around entire body.  ?  Will cont to eval and treat per MD orders.  Melonie Florida, RN  02/07/2019 7:53 PM

## 2019-02-07 NOTE — Progress Notes (Signed)
..  [facility] ADULT ICU REPLACEMENT PROTOCOL FOR AM LAB REPLACEMENT ONLY  The patient does apply for the [facility] Adult ICU Electrolyte Replacment Protocol based on the criteria listed below:   1. Is GFR >/= 40 ml/min? Yes.    Patient's GFR today is >60 2. Is urine output >/= 0.5 ml/kg/hr for the last 6 hours? Yes.   Patient's UOP is 2.74 ml/kg/hr 3. Is BUN < 60 mg/dL? Yes.    Patient's BUN today is 8 4. Abnormal electrolyte(s): K3.0 5. Ordered repletion with: protocol 6. If a panic level lab has been reported, has the CCM MD in charge been notified? Yes.  .   Physician:  Imagene Riches 02/07/2019 5:27 AM

## 2019-02-08 ENCOUNTER — Telehealth: Payer: Self-pay

## 2019-02-08 LAB — BASIC METABOLIC PANEL
Anion gap: 9 (ref 5–15)
BUN: 10 mg/dL (ref 6–20)
CO2: 25 mmol/L (ref 22–32)
Calcium: 8.8 mg/dL — ABNORMAL LOW (ref 8.9–10.3)
Chloride: 98 mmol/L (ref 98–111)
Creatinine, Ser: 0.78 mg/dL (ref 0.61–1.24)
GFR calc Af Amer: >60 mL/min (ref >60)
GFR calc non Af Amer: >60 mL/min (ref >60)
Glucose, Bld: 104 mg/dL — ABNORMAL HIGH (ref 70–99)
Potassium: 3.7 mmol/L (ref 3.5–5.1)
Sodium: 132 mmol/L — ABNORMAL LOW (ref 135–145)

## 2019-02-08 LAB — CBC WITH DIFFERENTIAL/PLATELET
Abs Immature Granulocytes: 0.55 10*3/uL — ABNORMAL HIGH (ref 0.00–0.07)
Basophils Absolute: 0.1 10*3/uL (ref 0.0–0.1)
Basophils Relative: 0 %
Eosinophils Absolute: 0.1 10*3/uL (ref 0.0–0.5)
Eosinophils Relative: 1 %
HCT: 25 % — ABNORMAL LOW (ref 39.0–52.0)
Hemoglobin: 8.5 g/dL — ABNORMAL LOW (ref 13.0–17.0)
Immature Granulocytes: 4 %
Lymphocytes Relative: 16 %
Lymphs Abs: 2.1 10*3/uL (ref 0.7–4.0)
MCH: 33.3 pg (ref 26.0–34.0)
MCHC: 34 g/dL (ref 30.0–36.0)
MCV: 98 fL (ref 80.0–100.0)
Monocytes Absolute: 1.5 10*3/uL — ABNORMAL HIGH (ref 0.1–1.0)
Monocytes Relative: 11 %
Neutro Abs: 9.1 10*3/uL — ABNORMAL HIGH (ref 1.7–7.7)
Neutrophils Relative %: 68 %
Platelets: 280 10*3/uL (ref 150–400)
RBC: 2.55 MIL/uL — ABNORMAL LOW (ref 4.22–5.81)
RDW: 13.8 % (ref 11.5–15.5)
WBC: 13.5 10*3/uL — ABNORMAL HIGH (ref 4.0–10.5)
nRBC: 0 % (ref 0.0–0.2)

## 2019-02-08 LAB — MAGNESIUM: Magnesium: 1.4 mg/dL — ABNORMAL LOW (ref 1.7–2.4)

## 2019-02-08 LAB — PHOSPHORUS: Phosphorus: 2.1 mg/dL — ABNORMAL LOW (ref 2.5–4.6)

## 2019-02-08 MED ORDER — MAGNESIUM SULFATE 2 GM/50ML IV SOLN
2.0000 g | Freq: Once | INTRAVENOUS | Status: AC
  Administered 2019-02-08: 2 g via INTRAVENOUS
  Filled 2019-02-08: qty 50

## 2019-02-08 MED ORDER — K PHOS MONO-SOD PHOS DI & MONO 155-852-130 MG PO TABS
500.0000 mg | ORAL_TABLET | Freq: Three times a day (TID) | ORAL | Status: DC
  Administered 2019-02-08 – 2019-02-09 (×3): 500 mg via ORAL
  Filled 2019-02-08 (×5): qty 2

## 2019-02-08 MED ORDER — VITAMIN B-1 100 MG PO TABS
100.0000 mg | ORAL_TABLET | Freq: Every day | ORAL | Status: DC
  Administered 2019-02-08 – 2019-02-10 (×3): 100 mg via ORAL
  Filled 2019-02-08 (×3): qty 1

## 2019-02-08 NOTE — Telephone Encounter (Signed)
Dustin Velazquez called stating he was admitted to [facility] for falling out of shower & hitting his head & face on the sink.  [name] he was in ICU for 4 days & moved out of ICU yesterday.  States CT showed "bleeding around the brain" & he's still having dizziness & having to walk with a walker.  Had a PT consult today.  Hopes to be D/C'd tomorrow with PT.  [name] he will call back tomorrow to advise.  AMD

## 2019-02-08 NOTE — Progress Notes (Addendum)
Occupational Therapy Evaluation Patient Details Name: Dustin Velazquez MRN: [id number] DOB: Jun 29, 1961 Today's Date: 02/08/2019    History of Present Illness Pt adm after fall with hyponatremia and medial bilateral SAH. Course complicated by delirium and ETOH withdrawal. Pt with in hospital fall on 7/30. PMH - etoh abuse, HTN, CAD   Clinical Impression   PTA, pt was living at home alone, and reports he was independent with ADL/IADL and functional mobility. Pt reports he was working for the [employer]. Pt currently requires S-minguard for ADL and functional mobility at RW level. Pt educated on activity progression, increasing self-awareness of deficits to identify feeling dizzy/lightheaded, and fall prevention strategies. HR 83-82 at rest, HR 97 with ambulation, HR 101-103 intermittent spike to 120 during oral care while standing at sink. Due to decline in current level of function, pt would benefit from acute OT to address established goals to facilitate safe D/C to venue listed below. Feel pt will benefit from HHPT, but no OT needs identified at this time. Will continue to follow acutely.  Orthostatic Blood Pressure:  Sitting: 125/90  Standing 1 min: 107/86    Standing 3 min: 117/86  Standing 5 min (after grooming at sink): 121/78      Follow Up Recommendations  HHOT;Supervision - Intermittent(with mobility )    Equipment Recommendations  3 in 1 bedside commode    Recommendations for Other Services       Precautions / Restrictions Precautions Precautions: Fall;Other (comment) Precaution Comments: check orthostatics Restrictions Weight Bearing Restrictions: No      Mobility Bed Mobility Overal bed mobility: Needs Assistance Bed Mobility: Supine to Sit     Supine to sit: Supervision;HOB elevated     General bed mobility comments: supervision for safety  Transfers Overall transfer level: Needs assistance Equipment used: Rolling walker (2 wheeled) Transfers:  Sit to/from Stand Sit to Stand: Supervision;Min guard         General transfer comment: minguard due to initial lightheadedness with standing    Balance Overall balance assessment: Needs assistance Sitting-balance support: No upper extremity supported;Feet supported Sitting balance-Leahy Scale: Fair     Standing balance support: Single extremity supported;During functional activity;No upper extremity supported Standing balance-Leahy Scale: Fair Standing balance comment: able to tolerate static standing without UE support;able to brush teeth at sink with preference for single ue support                           ADL either performed or assessed with clinical judgement   ADL Overall ADL's : Needs assistance/impaired Eating/Feeding: Supervision/ safety   Grooming: Supervision/safety;Standing Grooming Details (indicate cue type and reason): completed oral care at sink level  Upper Body Bathing: Supervision/ safety;Sitting   Lower Body Bathing: Supervison/ safety;Sit to/from stand   Upper Body Dressing : Supervision/safety;Sitting   Lower Body Dressing: Supervision/safety;Sit to/from stand   Toilet Transfer: Supervision/safety;Min guard;Ambulation;RW Toilet Transfer Details (indicate cue type and reason): simulated from EOB with in room mobility to recliner Toileting- Clothing Manipulation and Hygiene: Supervision/safety;Sit to/from stand       Functional mobility during ADLs: Supervision/safety;Min guard;Rolling walker General ADL Comments: Supervision with intermittent need for minguard due to feelings of dizziness with initial standing;educated pt on activity progression and taking time during movement;pt verbalized understanding;     Vision Patient Visual Report: No change from baseline       Perception     Praxis      Pertinent Vitals/Pain Pain Assessment:  No/denies pain     Hand Dominance Right   Extremity/Trunk Assessment Upper Extremity  Assessment Upper Extremity Assessment: Overall WFL for tasks assessed   Lower Extremity Assessment Lower Extremity Assessment: Defer to PT evaluation   Cervical / Trunk Assessment Cervical / Trunk Assessment: Normal   Communication Communication Communication: No difficulties   Cognition Arousal/Alertness: Awake/alert Behavior During Therapy: WFL for tasks assessed/performed Overall Cognitive Status: Within Functional Limits for tasks assessed                                 General Comments: pt aware of deficits; emotional lability when discussing his recent separation from wife;pt openly discusses feeling depressed do to separation and reports he has therapist appointment setup after d/c;reports having a friend who is 15 years sober who he is in close contact with;discussed pt writing an unsent letter as positive coping strategy, pt open to the idea. Discussed recommendation for spiritual team, pt states he is not ready for that at this time and  "would like to think about it, and will ask if/when hes ready"   General Comments  bruising around bilateral eyes, Left worse than right;pt reports bruising is the same    Exercises     Shoulder Instructions      Home Living Family/patient expects to be discharged to:: Private residence Living Arrangements: Alone Available Help at Discharge: Friend(s);Available PRN/intermittently Type of Home: Apartment Home Access: Level entry     Home Layout: One level     Bathroom Shower/Tub: Chief Strategy OfficerTub/shower unit   Bathroom Toilet: Standard Bathroom Accessibility: Yes How Accessible: Accessible via walker Home Equipment: None          Prior Functioning/Environment Level of Independence: Independent        Comments: 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         OT Problem List: Decreased activity tolerance;Impaired balance (sitting and/or standing);Decreased safety awareness;Decreased knowledge of  use of DME or AE;Cardiopulmonary status limiting activity      OT Treatment/Interventions: Self-care/ADL training;Energy conservation;DME and/or AE instruction;Therapeutic activities;Patient/family education;Balance training    OT Goals(Current goals can be found in the care plan section) Acute Rehab OT Goals Patient Stated Goal: to get stronger OT Goal Formulation: With patient Time For Goal Achievement: 02/22/19 Potential to Achieve Goals: Good ADL Goals Pt Will Perform Grooming: with modified independence Pt Will Perform Upper Body Dressing: with modified independence Pt Will Perform Lower Body Dressing: with modified independence Pt Will Transfer to Toilet: with modified independence;ambulating Pt Will Perform Tub/Shower Transfer: with modified independence Additional ADL Goal #1: Pt will demonstrate independence with 3 fall prevention strategies.  OT Frequency: Min 2X/week   Barriers to D/C: Decreased caregiver support  pt lives alone       Co-evaluation              AM-PAC OT "6 Clicks" Daily Activity     Outcome Measure Help from another person eating meals?: A Little Help from another person taking care of personal grooming?: A Little Help from another person toileting, which includes using toliet, bedpan, or urinal?: A Little Help from another person bathing (including washing, rinsing, drying)?: A Little Help from another person to put on and taking off regular upper body clothing?: A Little Help from another person to put on and taking off regular lower body clothing?: A Little 6 Click Score: 18   End of  Session Equipment Utilized During Treatment: Gait belt;Rolling walker Nurse Communication: Mobility status  Activity Tolerance: Patient tolerated treatment well Patient left: in chair;with call bell/phone within reach;with chair alarm set  OT Visit Diagnosis: Unsteadiness on feet (R26.81);Other abnormalities of gait and mobility (R26.89);History of falling  (Z91.81)                Time: 1000-1046 OT Time Calculation (min): 46 min Charges:  OT General Charges $OT Visit: 1 Visit OT Evaluation $OT Eval Moderate Complexity: 1 Mod OT Treatments $Self Care/Home Management : 8-22 mins $Cognitive Funtion inital: Initial 15 mins  Dorinda Hill OTR/L Acute Rehabilitation Services Office: [phone]   Wyn Forster 02/08/2019, 11:26 AM

## 2019-02-08 NOTE — Progress Notes (Signed)
PROGRESS NOTE    Dustin Velazquez  ZOX:[id number] DOB: Jan 29, 1961 DOA: 02/03/2019 PCP: Jamelle HaringHendrickson, Clifford D, MD    Brief Narrative:  [age] year old gentleman with history of alcohol abuse, last relapse 7/4, tobacco use, GERD, hypertension, hyperlipidemia, non-STEMI with stent placement on 12/2016 on aspirin and Brilinta who presented to the emergency room on 7/29 after sustaining a fall the night before.  Patient reported that he was exiting in his shower and fell on the front of his head and hit on toilet seat.  Patient endorsed transient loss of consciousness.  He was feeling dizzy and falling again so came to the emergency room after calling EMS.  He has been on and off drinking alcohol.  In the emergency room, a CT head, maxillofacial and C-spine showed subarachnoid hemorrhage without complication.  He was given DDAVP because of being on Brilinta.  Repeat CT scans are stable.  Seen by neurosurgery and recommended supportive treatment.  Also found to have sodium of 118 and creatinine of 1.8.  He was admitted to ICU and now improved.   Assessment & Plan:   Active Problems:   Hyponatremia   Subarachnoid bleed (HCC)   Traumatic subarachnoid hemorrhage bilateral frontal lobe in the setting of chronic use of aspirin and Brilinta: Day 3 in the hospital.  No neurological deficit.  Clinically improving.  Seen by neurosurgery.  Repeat CT scan is stable.  Off aspirin and Brilinta.  Will discuss with neurosurgery when is appropriate to resume aspirin.  He does not need Brilinta.  His stent was 2 years ago and does not have any recurrent chest pain. 02/08/2019: Paged neurosurgery in regard to restarting the aspirin.  Will await response.  Coronary artery disease status post stent: On beta-blockers that we will continue.  He is on Crestor that we will continue.   02/08/2019: Continue beta-blockers, Crestor.  Paged neurosurgery to ask about restarting the aspirin.  Hypovolemic hyponatremia: Likely acute on  chronic hyponatremia.  Was treated with isotonic fluid with improvement.  Sodium is 131 today. 02/08/2019: Sodium today 132.  Acute kidney injury and rhabdomyolysis: Improved with IV fluids.  Continue to monitor levels.  Hypokalemia: Potassium replaced.  Level today 3.7.  Hypomagnesemia/hypophosphatemia: Replacement ordered.  Will recheck in a.m.  Alcohol dependence and abuse: Patient is stated not drinking for 3 weeks, however unreliable history.  Remains on minimum dose of benzodiazepine and CIWA protocol.  Will continue.  On multivitamins.  Patient was on Precedex in the intensive care unit and taken off.  He is on Zoloft that he will continue. 02/08/2019: Does not appear to be in any withdrawal.  Continue Ativan as needed.  GERD: On PPI that he will continue.   DVT prophylaxis: SCDs Code Status: Full code Family Communication:  Patient oriented x3, plan of care discussed with the patient.  Unable to reach family. Disposition Plan:  Likely home with home health.   Consultants:   PCCM  Neurosurgery   Procedures:   None  Antimicrobials:   None    Subjective: He is complaining of some dizziness especially when he is standing up.  Denies any other complaints.  Objective: Vitals:   02/07/19 2142 02/08/19 0534 02/08/19 0853 02/08/19 1253  BP: 123/89 (!) 154/94 (!) 128/59 135/84  Pulse: 88 93 (!) 105 83  Resp:   16 15  Temp: 98.3 F (36.8 C) 98.8 F (37.1 C) 98.3 F (36.8 C) 97.6 F (36.4 C)  TempSrc: Oral  Oral Oral  SpO2: 99% 100% 100% 100%  Weight:      Height:        Intake/Output Summary (Last 24 hours) at 02/08/2019 1424 Last data filed at 02/08/2019 0913 Gross per 24 hour  Intake 390 ml  Output -  Net 390 ml   Filed Weights   02/05/19 0426 02/06/19 0352 02/07/19 0500  Weight: 80 kg 66.9 kg 66.9 kg    Examination:  General exam: Appears calm and comfortable.  Subconjunctival hemorrhage left eye.  Has some ecchymosis around the left eye socket and  bridge of nose. Respiratory system: Clear to auscultation. Respiratory effort normal. Cardiovascular system: S1 & S2 heard, RRR. No JVD, murmurs, rubs, gallops or clicks. No pedal edema. Gastrointestinal system: Abdomen is nondistended, soft and nontender. No organomegaly or masses felt. Normal bowel sounds heard. Central nervous system: Alert and oriented. No focal neurological deficits. Extremities: Symmetric 5 x 5 power. Skin: No rashes, lesions or ulcers Psychiatry: Judgement and insight appear normal. Mood & affect appropriate.     Data Reviewed: I have personally reviewed following labs and imaging studies  CBC: Recent Labs  Lab 02/03/19 1530 02/03/19 2047 02/04/19 0255 02/05/19 0214 02/06/19 0156 02/08/19 0307  WBC 12.5* 10.4 10.6* 11.7* 11.3* 13.5*  NEUTROABS 11.0*  --  8.8*  --   --  9.1*  HGB 11.3* 9.8* 9.7* 8.6* 8.2* 8.5*  HCT 31.6* 27.3* 26.8* 23.9* 23.2* 25.0*  MCV 91.9 91.6 91.5 92.6 95.5 98.0  PLT 221 186 192 163 189 280   Basic Metabolic Panel: Recent Labs  Lab 02/03/19 2047  02/04/19 1043  02/05/19 0214  02/05/19 1807 02/06/19 0156 02/06/19 1124 02/07/19 0245 02/08/19 0307  NA 120*   < > 122*   < > 119*   < > 122* 126* 130* 131* 132*  K 3.4*   < > 3.6   < > 3.5   < > 3.3* 3.3* 3.3* 3.0* 3.7  CL 82*   < > 83*   < > 82*   < > 88* 90* 96* 95* 98  CO2 22   < > 24   < > 26   < > 25 26 24 26 25   GLUCOSE 119*   < > 119*   < > 134*   < > 104* 112* 106* 106* 104*  BUN 17   < > 17   < > 19   < > 15 13 11 8 10   CREATININE 1.39*   < > 1.05   < > 0.97   < > 0.84 0.85 0.82 0.74 0.78  CALCIUM 7.8*   < > 8.4*   < > 8.5*   < > 8.0* 8.3* 8.7* 8.9 8.8*  MG 1.5*  --  1.8  --  2.2  --   --   --   --   --  1.4*  PHOS 2.3*  --   --   --  2.5  --   --   --   --   --  2.1*   < > = values in this interval not displayed.   GFR: Estimated Creatinine Clearance: 95.2 mL/min (by C-G formula based on SCr of 0.78 mg/dL). Liver Function Tests: Recent Labs  Lab 02/03/19 1530   AST 143*  ALT 116*  ALKPHOS 65  BILITOT 1.9*  PROT 6.3*  ALBUMIN 3.6   No results for input(s): LIPASE, AMYLASE in the last 168 hours. No results for input(s): AMMONIA in the last 168 hours. Coagulation Profile: Recent Labs  Lab 02/03/19  1700  INR 1.0   Cardiac Enzymes: Recent Labs  Lab 02/03/19 2047 02/04/19 0255 02/04/19 1043 02/05/19 0214 02/06/19 0156  CKTOTAL 1,973* 1,718* 1,442* 893* 391   BNP (last 3 results) No results for input(s): PROBNP in the last 8760 hours. HbA1C: No results for input(s): HGBA1C in the last 72 hours. CBG: Recent Labs  Lab 02/04/19 0720 02/04/19 2324  GLUCAP 123* 143*   Lipid Profile: No results for input(s): CHOL, HDL, LDLCALC, TRIG, CHOLHDL, LDLDIRECT in the last 72 hours. Thyroid Function Tests: No results for input(s): TSH, T4TOTAL, FREET4, T3FREE, THYROIDAB in the last 72 hours. Anemia Panel: No results for input(s): VITAMINB12, FOLATE, FERRITIN, TIBC, IRON, RETICCTPCT in the last 72 hours. Sepsis Labs: No results for input(s): PROCALCITON, LATICACIDVEN in the last 168 hours.  Recent Results (from the past 240 hour(s))  SARS Coronavirus 2 (CEPHEID - Performed in [facility] hospital lab), Hosp Order     Status: None   Collection Time: 02/03/19  5:22 PM   Specimen: Nasopharyngeal Swab  Result Value Ref Range Status   SARS Coronavirus 2 NEGATIVE NEGATIVE Final    Comment: (NOTE) If result is NEGATIVE SARS-CoV-2 target nucleic acids are NOT DETECTED. The SARS-CoV-2 RNA is generally detectable in upper and lower  respiratory specimens during the acute phase of infection. The lowest  concentration of SARS-CoV-2 viral copies this assay can detect is 250  copies / mL. A negative result does not preclude SARS-CoV-2 infection  and should not be used as the sole basis for treatment or other  patient management decisions.  A negative result may occur with  improper specimen collection / handling, submission of specimen other  than  nasopharyngeal swab, presence of viral mutation(s) within the  areas targeted by this assay, and inadequate number of viral copies  (<250 copies / mL). A negative result must be combined with clinical  observations, patient history, and epidemiological information. If result is POSITIVE SARS-CoV-2 target nucleic acids are DETECTED. The SARS-CoV-2 RNA is generally detectable in upper and lower  respiratory specimens dur ing the acute phase of infection.  Positive  results are indicative of active infection with SARS-CoV-2.  Clinical  correlation with patient history and other diagnostic information is  necessary to determine patient infection status.  Positive results do  not rule out bacterial infection or co-infection with other viruses. If result is PRESUMPTIVE POSTIVE SARS-CoV-2 nucleic acids MAY BE PRESENT.   A presumptive positive result was obtained on the submitted specimen  and confirmed on repeat testing.  While 2019 novel coronavirus  (SARS-CoV-2) nucleic acids may be present in the submitted sample  additional confirmatory testing may be necessary for epidemiological  and / or clinical management purposes  to differentiate between  SARS-CoV-2 and other Sarbecovirus currently known to infect humans.  If clinically indicated additional testing with an alternate test  methodology [id number]) is advised. The SARS-CoV-2 RNA is generally  detectable in upper and lower respiratory sp ecimens during the acute  phase of infection. The expected result is Negative. Fact Sheet for Patients:  BoilerBrush.com.cy Fact Sheet for Healthcare Providers: https://pope.com/ This test is not yet approved or cleared by the [place] FDA and has been authorized for detection and/or diagnosis of SARS-CoV-2 by FDA under an Emergency Use Authorization (EUA).  This EUA will remain in effect (meaning this test can be used) for the duration of the  COVID-19 declaration under Section 564(b)(1) of the Act, 21 U.S.C. section 360bbb-3(b)(1), unless the authorization is terminated or  revoked sooner. Performed at [facility], [place] [address] [place], [place] [address]   MRSA PCR Screening     Status: None   Collection Time: 02/03/19  8:40 PM   Specimen: Nasal Mucosa; Nasopharyngeal  Result Value Ref Range Status   MRSA by PCR NEGATIVE NEGATIVE Final    Comment:        The GeneXpert MRSA Assay (FDA approved for NASAL specimens only), is one component of a comprehensive MRSA colonization surveillance program. It is not intended to diagnose MRSA infection nor to guide or monitor treatment for MRSA infections. Performed at [facility], [place] [address], [place], [place] [address]          Radiology Studies: No results found.      Scheduled Meds: . Chlorhexidine Gluconate Cloth  6 each Topical Q0600  . metoprolol tartrate  50 mg Oral BID  . nicotine  21 mg Transdermal Daily  . pantoprazole  40 mg Oral Daily  . polyethylene glycol  17 g Oral Daily  . rosuvastatin  40 mg Oral QPM  . sertraline  50 mg Oral Daily   Continuous Infusions:   LOS: 5 days    Time spent: 25 min    Yaakov Guthrie, MD [facility] Pager on amion  If 7PM-7AM, please contact night-coverage [email or web address] Password [email or web address] 02/08/2019, 2:24 PM

## 2019-02-08 NOTE — Progress Notes (Signed)
Physical Therapy Treatment Patient Details Name: Dustin Velazquez MRN: [id number] DOB: 08-Dec-1960 Today's Date: 02/08/2019    History of Present Illness Pt adm after fall with hyponatremia and medial bilateral SAH. Course complicated by delirium and ETOH withdrawal. Pt with in hospital fall on 7/30. PMH - etoh abuse, HTN, CAD    PT Comments    Continuing work on functional mobility and activity tolerance;  Noting excellent improvements in gait and mobility compared to initial PT evaluation; Able to walk the hallways with RW -- though dizziness persists; BP in standing after walking 123/88 -- no significant BP drop to explain dizziness; Noted slow and difficulty with VOR x1 exercises -- further vestibular evaluation is warranted;   At his current functional level, dc home with HHPT/OT follow up is not unreasonable; Would like for him to pull together friends and resources to give him help at home.   Follow Up Recommendations  Home health PT;Supervision/Assistance - 24 hour(at or near 24 hours)     Equipment Recommendations  3in1 (PT);Other (comment)(Rollator, and shower seat defer shower seat recs to OT)    Recommendations for Other Services       Precautions / Restrictions Precautions Precautions: Fall;Other (comment) Precaution Comments: check orthostatics Restrictions Weight Bearing Restrictions: No    Mobility  Bed Mobility Overal bed mobility: Needs Assistance Bed Mobility: Supine to Sit     Supine to sit: Supervision;HOB elevated     General bed mobility comments: Supervision for safety; cued to take it slowly  Transfers Overall transfer level: Needs assistance Equipment used: Rolling walker (2 wheeled) Transfers: Sit to/from Stand Sit to Stand: Supervision;Min guard         General transfer comment: minguard-S for safety   Ambulation/Gait Ambulation/Gait assistance: Min guard Gait Distance (Feet): 100 Feet Assistive device: Rolling walker (2 wheeled) Gait  Pattern/deviations: Step-through pattern;Decreased step length - right;Decreased step length - left;Decreased stride length     General Gait Details: Cues to self-monitor for activity tolerance; Reported dizziness with ambulation; took BP at end of walk in standing: 123/88, HR112, O2 sat 100%   Stairs             Wheelchair Mobility    Modified Rankin (Stroke Patients Only)       Balance Overall balance assessment: Needs assistance Sitting-balance support: No upper extremity supported;Feet supported Sitting balance-Leahy Scale: Fair     Standing balance support: Single extremity supported;During functional activity Standing balance-Leahy Scale: Fair Standing balance comment: reliant with intermittent UE support                            Cognition Arousal/Alertness: Awake/alert Behavior During Therapy: WFL for tasks assessed/performed Overall Cognitive Status: Within Functional Limits for tasks assessed                                 General Comments: less emotionally labile than earlier      Exercises      General Comments General comments (skin integrity, edema, etc.): cotninued to discuss activity progression, increasing self awareness;pt reports dizziness with ambulation;further evaluation of vestibular changes necessary; noteworthy difficulty with testing VOR x1      Pertinent Vitals/Pain Pain Assessment: 0-10 Pain Score: 4  Pain Location: head Pain Descriptors / Indicators: Headache Pain Intervention(s): Monitored during session    Home Living  Prior Function            PT Goals (current goals can now be found in the care plan section) Acute Rehab PT Goals Patient Stated Goal: to get stronger; he is asking about return to work PT Goal Formulation: With patient Time For Goal Achievement: 02/19/19 Potential to Achieve Goals: Good Progress towards PT goals: Progressing toward goals(will likely  meet original goals next session and need to upg)    Frequency    Min 3X/week      PT Plan Discharge plan needs to be updated    Co-evaluation              AM-PAC PT "6 Clicks" Mobility   Outcome Measure  Help needed turning from your back to your side while in a flat bed without using bedrails?: None Help needed moving from lying on your back to sitting on the side of a flat bed without using bedrails?: None Help needed moving to and from a bed to a chair (including a wheelchair)?: A Little Help needed standing up from a chair using your arms (e.g., wheelchair or bedside chair)?: A Little Help needed to walk in hospital room?: A Little Help needed climbing 3-5 steps with a railing? : A Lot 6 Click Score: 19    End of Session Equipment Utilized During Treatment: Gait belt Activity Tolerance: Patient tolerated treatment well Patient left: in bed;with call bell/phone within reach;with bed alarm set Nurse Communication: Mobility status PT Visit Diagnosis: Other abnormalities of gait and mobility (R26.89);Muscle weakness (generalized) (M62.81);History of falling (Z91.81)     Time: 1411-1441 PT Time Calculation (min) (ACUTE ONLY): 30 min  Charges:  $Gait Training: 8-22 mins $Therapeutic Activity: 8-22 mins                     Van Clines, PT  Acute Rehabilitation Services Pager [phone] Office [phone]    Levi Aland 02/08/2019, 3:58 PM

## 2019-02-08 NOTE — Progress Notes (Addendum)
Occupational Therapy Treatment Patient Details Name: Dustin Velazquez MRN: [id number] DOB: 10-26-1960 Today's Date: 02/08/2019    History of present illness Pt adm after fall with hyponatremia and medial bilateral SAH. Course complicated by delirium and ETOH withdrawal. Pt with in hospital fall on 7/30. PMH - etoh abuse, HTN, CAD   OT comments  Pt ambulating with PT in the hallway at RW level with minguard, pt reported increased dizziness requiring return to room. OT followed pt and PT with dynamat, BP  123/88. Pt demonstrated difficulty with VOR exercise, would benefit from further vestibular evaluation. Continued to educate pt on importance of support at home and activity progression. Recommendation updated to [facility] with close to 24hr supervision.     Follow Up Recommendations  Home health OT;Supervision/Assistance - 24 hour(close to 24/7)    Equipment Recommendations  3 in 1 bedside commode    Recommendations for Other Services      Precautions / Restrictions Precautions Precautions: Fall;Other (comment) Precaution Comments: check orthostatics Restrictions Weight Bearing Restrictions: No       Mobility Bed Mobility Overal bed mobility: Needs Assistance Bed Mobility: Supine to Sit     Supine to sit: Supervision;HOB elevated     General bed mobility comments: pt ambulating and sitting EOB   Transfers Overall transfer level: Needs assistance Equipment used: Rolling walker (2 wheeled) Transfers: Sit to/from Stand Sit to Stand: Supervision;Min guard         General transfer comment: minguard-S for safety     Balance Overall balance assessment: Needs assistance Sitting-balance support: No upper extremity supported;Feet supported Sitting balance-Leahy Scale: Fair     Standing balance support: Single extremity supported;During functional activity Standing balance-Leahy Scale: Fair Standing balance comment: reliant with intermittent UE support                            ADL either performed or assessed with clinical judgement   ADL Overall ADL's : Needs assistance/impaired                         Toilet Transfer: Supervision/safety;Min guard;Ambulation;RW Toilet Transfer Details (indicate cue type and reason): simulated         Functional mobility during ADLs: Supervision/safety;Min guard;Rolling walker General ADL Comments: continued to educate pt on energy conservation and activity progression     Vision       Perception     Praxis      Cognition Arousal/Alertness: Awake/alert Behavior During Therapy: WFL for tasks assessed/performed Overall Cognitive Status: Within Functional Limits for tasks assessed                                 General Comments: less emotionally labile than earlier        Exercises     Shoulder Instructions       General Comments cotninued to discuss activity progression, increasing self awareness;pt reports dizziness with ambulation;further evaluation of vestibular changes necessary     Pertinent Vitals/ Pain       Pain Assessment: 0-10 Pain Score: 4  Pain Location: head Pain Descriptors / Indicators: Headache Pain Intervention(s): Limited activity within patient's tolerance;Monitored during session  Home Living  Prior Functioning/Environment              Frequency  Min 2X/week        Progress Toward Goals  OT Goals(current goals can now be found in the care plan section)  Progress towards OT goals: Progressing toward goals  Acute Rehab OT Goals Patient Stated Goal: to get stronger OT Goal Formulation: With patient Time For Goal Achievement: 02/22/19 Potential to Achieve Goals: Good ADL Goals Pt Will Perform Grooming: with modified independence Pt Will Perform Upper Body Dressing: with modified independence Pt Will Perform Lower Body Dressing: with modified independence Pt Will  Transfer to Toilet: with modified independence;ambulating Pt Will Perform Tub/Shower Transfer: with modified independence Additional ADL Goal #1: Pt will demonstrate independence with 3 fall prevention strategies.  Plan Discharge plan needs to be updated    Co-evaluation                 AM-PAC OT "6 Clicks" Daily Activity     Outcome Measure   Help from another person eating meals?: A Little Help from another person taking care of personal grooming?: A Little Help from another person toileting, which includes using toliet, bedpan, or urinal?: A Little Help from another person bathing (including washing, rinsing, drying)?: A Little Help from another person to put on and taking off regular upper body clothing?: A Little Help from another person to put on and taking off regular lower body clothing?: A Little 6 Click Score: 18    End of Session Equipment Utilized During Treatment: Gait belt;Rolling walker  OT Visit Diagnosis: Unsteadiness on feet (R26.81);Other abnormalities of gait and mobility (R26.89);History of falling (Z91.81)   Activity Tolerance Patient tolerated treatment well   Patient Left in chair;with bed alarm set;Other (comment)(with MD present)   Nurse Communication Mobility status        Time: [phone] OT Time Calculation (min): 10 min  Charges: OT General Charges $OT Visit: 1 Visit OT Treatments $Self Care/Home Management : 8-22 mins  Dorinda Hill OTR/L Acute Rehabilitation Services Office: [place] 02/08/2019, 4:35 PM

## 2019-02-08 NOTE — Telephone Encounter (Signed)
See next note

## 2019-02-09 LAB — BASIC METABOLIC PANEL
Anion gap: 10 (ref 5–15)
BUN: 12 mg/dL (ref 6–20)
CO2: 26 mmol/L (ref 22–32)
Calcium: 8.8 mg/dL — ABNORMAL LOW (ref 8.9–10.3)
Chloride: 98 mmol/L (ref 98–111)
Creatinine, Ser: 0.66 mg/dL (ref 0.61–1.24)
GFR calc Af Amer: >60 mL/min (ref >60)
GFR calc non Af Amer: >60 mL/min (ref >60)
Glucose, Bld: 112 mg/dL — ABNORMAL HIGH (ref 70–99)
Potassium: 3.7 mmol/L (ref 3.5–5.1)
Sodium: 134 mmol/L — ABNORMAL LOW (ref 135–145)

## 2019-02-09 LAB — CBC
HCT: 24.3 % — ABNORMAL LOW (ref 39.0–52.0)
Hemoglobin: 8.2 g/dL — ABNORMAL LOW (ref 13.0–17.0)
MCH: 33.6 pg (ref 26.0–34.0)
MCHC: 33.7 g/dL (ref 30.0–36.0)
MCV: 99.6 fL (ref 80.0–100.0)
Platelets: 294 10*3/uL (ref 150–400)
RBC: 2.44 MIL/uL — ABNORMAL LOW (ref 4.22–5.81)
RDW: 14.1 % (ref 11.5–15.5)
WBC: 12.4 10*3/uL — ABNORMAL HIGH (ref 4.0–10.5)
nRBC: 0 % (ref 0.0–0.2)

## 2019-02-09 LAB — MAGNESIUM: Magnesium: 1.6 mg/dL — ABNORMAL LOW (ref 1.7–2.4)

## 2019-02-09 LAB — PHOSPHORUS: Phosphorus: 4.9 mg/dL — ABNORMAL HIGH (ref 2.5–4.6)

## 2019-02-09 MED ORDER — THIAMINE HCL 100 MG PO TABS: 100.0000 mg | ORAL_TABLET | Freq: Every day | ORAL | 0 refills | Status: DC

## 2019-02-09 MED ORDER — MAGNESIUM SULFATE 2 GM/50ML IV SOLN
2.0000 g | Freq: Once | INTRAVENOUS | Status: AC
  Administered 2019-02-09: 2 g via INTRAVENOUS
  Filled 2019-02-09: qty 50

## 2019-02-09 NOTE — TOC Initial Note (Addendum)
Transition of Care [facility]) - Initial/Assessment Note    Patient Details  Name: Dustin Velazquez MRN: [id number] Date of Birth: 05-20-1961  Transition of [facility]) CM/SW Contact:    Mearl Latin, LCSW Phone Number: 02/09/2019, 5:28 PM  Clinical Narrative:                 CSW received consult for possible home health services at time of discharge. CSW spoke with patient regarding PT recommendation of Home Health PT at time of discharge. Patient reported that he would like home health services. CSW sent referral for review. He was declined by [facility], [facility], and [facility]. He has been accepted by [facility]. CSW provided Medicare SNF ratings list. CSW discussed equipment needs with patient and he requested a rollator. CSW reached out to [employer] for delivery to the room. CSW confirmed PCP and address with patient. No further questions reported at this time.    Expected Discharge Plan: Home w Home Health Services Barriers to Discharge: No Barriers Identified   Patient Goals and CMS Choice Patient states their goals for this hospitalization and ongoing recovery are:: Return home CMS Medicare.gov Compare Post Acute Care list provided to:: Patient Choice offered to / list presented to : Patient  Expected Discharge Plan and Services Expected Discharge Plan: Home w Home Health Services In-house Referral: Clinical Social Work Discharge Planning Services: CM Consult Post Acute Care Choice: Durable Medical Equipment, Home Health Living arrangements for the past 2 months: Single Family Home Expected Discharge Date: 02/09/19               DME Arranged: Dan Humphreys rolling with seat DME Agency: [employer] Date DME Agency Contacted: 02/09/19 Time DME Agency Contacted: 1500 Representative spoke with at DME Agency: Zack HH Arranged: PT HH Agency: [facility] Date [facility] Agency Contacted: 02/09/19 Time HH Agency Contacted: 1000 Representative spoke with at [facility]  Agency: Okey Regal  Prior Living Arrangements/Services Living arrangements for the past 2 months: Single Family Home Lives with:: Self Patient language and need for interpreter reviewed:: Yes Do you feel safe going back to the place where you live?: Yes      Need for Family Participation in Patient Care: No (Comment) Care giver support system in place?: Yes (comment)   Criminal Activity/Legal Involvement Pertinent to Current Situation/Hospitalization: No - Comment as needed  Activities of Daily Living      Permission Sought/Granted Permission sought to share information with : Facility Industrial/product designer granted to share information with : Yes, Verbal Permission Granted     Permission granted to share info w AGENCY: Home Health        Emotional Assessment Appearance:: Appears stated age Attitude/Demeanor/Rapport: Engaged Affect (typically observed): Accepting, Appropriate Orientation: : Oriented to Self, Oriented to Place, Oriented to  Time, Oriented to Situation Alcohol / Substance Use: Alcohol Use Psych Involvement: No (comment)  Admission diagnosis:  Hyponatremia [E87.1] Subarachnoid bleed (HCC) [I60.9] Patient Active Problem List   Diagnosis Date Noted  . Subarachnoid bleed (HCC)   . Hyponatremia 02/03/2019  . Depression 01/18/2019  . HTN (hypertension) 01/14/2019  . Alcohol withdrawal (HCC) 01/14/2019  . Anxiety state 01/14/2019  . MDD (major depressive disorder), recurrent episode, severe (HCC) 12/23/2018  . Severe recurrent major depression without psychotic features (HCC) 12/15/2018  . Suicidal behavior 12/14/2018  . Alcohol abuse 12/12/2018  . Non-STEMI (non-ST elevated myocardial infarction) (HCC) 12/29/2016   PCP:  Jamelle Haring, MD Pharmacy:   [facility]  [place], [place] [address] Phone: [phone] Fax: [phone]     Social Determinants of Health (SDOH) Interventions     Readmission Risk Interventions No flowsheet data found.

## 2019-02-09 NOTE — Progress Notes (Signed)
  Speech Language Pathology Treatment: Dysphagia  Patient Details Name: Dustin Velazquez MRN: [id number] DOB: 06-10-1961 Today's Date: 02/09/2019 Time: 1120-1129 SLP Time Calculation (min) (ACUTE ONLY): 9 min  Assessment / Plan / Recommendation Clinical Impression  Pt's oropharyngeal swallow appears functional, and he does not need cues for safety. He denies any subjective reports of difficulty as well. No further acute SLP needed for dysphagia - will sign off.   Although pt's mentation appears to be improving, SLP did provide education on the potential for cognitive changes s/p injury to his head. He denies any of these symptoms now, but did acknowledge that he will have help from family/friends upon return home, and he understands that HH/OP SLP services are available should he exhibit difficulty with higher level cognitive tasks.    HPI HPI: 58 yo M admitted 02/03/2019, fell and hit his head upon exiting the shower. Pt found to be hyponatremic 118 and have SAH. PMH: EtOH abuse, NSTEMI with stent placement, HTN, MDD, Anxiety, Tobacco use disorder, HLD. Head CT = Decreasing subarachnoid hemorrhage in the interhemisphericfissure.      SLP Plan  All goals met       Recommendations  Diet recommendations: Regular;Thin liquid Liquids provided via: Cup;Straw Medication Administration: Whole meds with liquid Supervision: Patient able to self feed;Intermittent supervision to cue for compensatory strategies Compensations: Minimize environmental distractions;Slow rate;Small sips/bites Postural Changes and/or Swallow Maneuvers: Seated upright 90 degrees                Oral Care Recommendations: Oral care BID Follow up Recommendations: 24 hour supervision/assistance SLP Visit Diagnosis: Dysphagia, unspecified (R13.10) Plan: All goals met       GO                Venita Sheffield [name] 02/09/2019, 12:21 PM  Pollyann Glen, M.A. [place] Acute Environmental education officer [phone] Office  [phone]

## 2019-02-09 NOTE — Progress Notes (Signed)
PROGRESS NOTE    Dustin PitchMichael L Velazquez  JXB:[id number] DOB: July 18, 1960 DOA: 02/03/2019 PCP: Jamelle HaringHendrickson, Clifford D, MD    Brief Narrative:  58 year old gentleman with history of alcohol abuse, last relapse 7/4, tobacco use, GERD, hypertension, hyperlipidemia, non-STEMI with stent placement on 12/2016 on aspirin and Brilinta who presented to the emergency room on 7/29 after sustaining a fall the night before. Patient reported that he was exiting in his shower and fell on the front of his head and hit on toilet seat. Patient endorsed transient loss of consciousness. He was feeling dizzy and falling again so came to the emergency room after calling EMS. He has been on and off drinking alcohol. In the emergency room, a CT head, maxillofacial and C-spine showed subarachnoid hemorrhage without complication. He was given DDAVP because of being on Brilinta. Repeat CT scans are stable. Seen by neurosurgery and recommended supportive treatment. Also found to have sodium of 118 and creatinine of 1.8. He was admitted to ICU and now improved.   Assessment & Plan:   Active Problems:   Hyponatremia   Subarachnoid bleed (HCC)   Traumatic subarachnoid hemorrhage bilateral frontal lobe in the setting of chronic use of aspirin and Brilinta: 02/07/2019: No neurological deficit. Clinically improving. Seen by neurosurgery. Repeat CT scan is stable. Off aspirin and Brilinta. Will discuss with neurosurgery when is appropriate to resume aspirin. He does not need Brilinta. His stent was 2 years ago and does not have any recurrent chest pain. 02/08/2019: Paged neurosurgery in regard to restarting the aspirin.  Will await response. 02/09/2019: Talk to neurosurgery.  Per Dr. Newell CoralNudelman patient can restart aspirin 2 weeks from today.  Explained the plan to the patient regarding the resumption of aspirin.  He was advised to talk to his cardiologist regarding need to restart Brilinta.  Coronary artery disease status post  stent: On beta-blockers that we will continue. He is on Crestor that we will continue.  02/08/2019: Continue beta-blockers, Crestor.  Paged neurosurgery to ask about restarting the aspirin. 02/09/2019: Per neurosurgery he can start the aspirin 2 weeks from today.  Patient was explained about this.  Hypovolemic hyponatremia: Likely acute on chronic hyponatremia. Was treated with isotonic fluid with improvement. Sodium is 131 today. 02/08/2019: Sodium today 132. 02/09/2019: Sodium today 134.  Acute kidney injury and rhabdomyolysis: Improved with IV fluids. Continue to monitor levels.  Hypokalemia: Potassium replaced.  Level today 3.7.  Hypomagnesemia/hypophosphatemia: Phosphorus replaced.  Will order magnesium. Will recheck in a.m.  Alcohol dependence and abuse: Patient is stated not drinking for 3 weeks, however unreliable history. Remains on minimum dose of benzodiazepine and CIWA protocol. Will continue. On multivitamins. Patient was on Precedex in the intensive care unit and taken off. He is on Zoloft that he will continue. 02/08/2019: Does not appear to be in any withdrawal.  Continue Ativan as needed. 02/09/2019: Not in any withdrawal at this time.  Ativan as needed.  GERD: On PPI that he will continue.   DVT prophylaxis:SCDs Code Status:Full code Family Communication: Patient oriented x3, plan of care discussed with the patient.  No family at bedside. Disposition Plan: Home with home health.  Patient was supposed to be discharged today.  However, he says he lives in [place] and his ride will not be able to pick him up until tomorrow morning.  Requesting to be discharged tomorrow a.m.  Case management arranging for his DME.  Consultants:   PCCM  Neurosurgery   Procedures:   None  Antimicrobials:   None  Subjective: Denies having any major complaints at this time.  Objective: Vitals:   02/08/19 1827 02/09/19 0030 02/09/19 0631 02/09/19 1223  BP:  124/76 130/70 138/69 116/75  Pulse: 75 70 75 72  Resp: 16 18 18 16   Temp: 98.2 F (36.8 C) 98 F (36.7 C) 98.8 F (37.1 C) 98.9 F (37.2 C)  TempSrc: Oral Oral Oral Oral  SpO2: 100% 100% 100% 100%  Weight:      Height:        Intake/Output Summary (Last 24 hours) at 02/09/2019 1646 Last data filed at 02/09/2019 1232 Gross per 24 hour  Intake -  Output 700 ml  Net -700 ml   Filed Weights   02/05/19 0426 02/06/19 0352 02/07/19 0500  Weight: 80 kg 66.9 kg 66.9 kg    Examination:  General exam: Appears calm and comfortable.  Subconjunctival hemorrhage of the left eye improving.  Ecchymosis around the left eye socket and bridge of nose. Respiratory system: Clear to auscultation. Respiratory effort normal. Cardiovascular system: S1 & S2 heard, RRR. No pedal edema. Gastrointestinal system: Abdomen is nondistended, soft and nontender. No organomegaly or masses felt. Normal bowel sounds heard. Central nervous system: Alert and oriented. No focal neurological deficits. Extremities: Symmetric 5 x 5 power. Psychiatry: Judgement and insight appear normal. Mood & affect appropriate.     Data Reviewed: I have personally reviewed following labs and imaging studies  CBC: Recent Labs  Lab 02/03/19 1530  02/04/19 0255 02/05/19 0214 02/06/19 0156 02/08/19 0307 02/09/19 0326  WBC 12.5*   < > 10.6* 11.7* 11.3* 13.5* 12.4*  NEUTROABS 11.0*  --  8.8*  --   --  9.1*  --   HGB 11.3*   < > 9.7* 8.6* 8.2* 8.5* 8.2*  HCT 31.6*   < > 26.8* 23.9* 23.2* 25.0* 24.3*  MCV 91.9   < > 91.5 92.6 95.5 98.0 99.6  PLT 221   < > 192 163 189 280 294   < > = values in this interval not displayed.   Basic Metabolic Panel: Recent Labs  Lab 02/03/19 2047  02/04/19 1043  02/05/19 0214  02/06/19 0156 02/06/19 1124 02/07/19 0245 02/08/19 0307 02/09/19 0326  NA 120*   < > 122*   < > 119*   < > 126* 130* 131* 132* 134*  K 3.4*   < > 3.6   < > 3.5   < > 3.3* 3.3* 3.0* 3.7 3.7  CL 82*   < > 83*   < >  82*   < > 90* 96* 95* 98 98  CO2 22   < > 24   < > 26   < > 26 24 26 25 26   GLUCOSE 119*   < > 119*   < > 134*   < > 112* 106* 106* 104* 112*  BUN 17   < > 17   < > 19   < > 13 11 8 10 12   CREATININE 1.39*   < > 1.05   < > 0.97   < > 0.85 0.82 0.74 0.78 0.66  CALCIUM 7.8*   < > 8.4*   < > 8.5*   < > 8.3* 8.7* 8.9 8.8* 8.8*  MG 1.5*  --  1.8  --  2.2  --   --   --   --  1.4* 1.6*  PHOS 2.3*  --   --   --  2.5  --   --   --   --  2.1* 4.9*   < > = values in this interval not displayed.   GFR: Estimated Creatinine Clearance: 95.2 mL/min (by C-G formula based on SCr of 0.66 mg/dL). Liver Function Tests: Recent Labs  Lab 02/03/19 1530  AST 143*  ALT 116*  ALKPHOS 65  BILITOT 1.9*  PROT 6.3*  ALBUMIN 3.6   No results for input(s): LIPASE, AMYLASE in the last 168 hours. No results for input(s): AMMONIA in the last 168 hours. Coagulation Profile: Recent Labs  Lab 02/03/19 1700  INR 1.0   Cardiac Enzymes: Recent Labs  Lab 02/03/19 2047 02/04/19 0255 02/04/19 1043 02/05/19 0214 02/06/19 0156  CKTOTAL 1,973* 1,718* 1,442* 893* 391   BNP (last 3 results) No results for input(s): PROBNP in the last 8760 hours. HbA1C: No results for input(s): HGBA1C in the last 72 hours. CBG: Recent Labs  Lab 02/04/19 0720 02/04/19 2324  GLUCAP 123* 143*   Lipid Profile: No results for input(s): CHOL, HDL, LDLCALC, TRIG, CHOLHDL, LDLDIRECT in the last 72 hours. Thyroid Function Tests: No results for input(s): TSH, T4TOTAL, FREET4, T3FREE, THYROIDAB in the last 72 hours. Anemia Panel: No results for input(s): VITAMINB12, FOLATE, FERRITIN, TIBC, IRON, RETICCTPCT in the last 72 hours. Sepsis Labs: No results for input(s): PROCALCITON, LATICACIDVEN in the last 168 hours.  Recent Results (from the past 240 hour(s))  SARS Coronavirus 2 (CEPHEID - Performed in [facility] hospital lab), Hosp Order     Status: None   Collection Time: 02/03/19  5:22 PM   Specimen: Nasopharyngeal Swab  Result  Value Ref Range Status   SARS Coronavirus 2 NEGATIVE NEGATIVE Final    Comment: (NOTE) If result is NEGATIVE SARS-CoV-2 target nucleic acids are NOT DETECTED. The SARS-CoV-2 RNA is generally detectable in upper and lower  respiratory specimens during the acute phase of infection. The lowest  concentration of SARS-CoV-2 viral copies this assay can detect is 250  copies / mL. A negative result does not preclude SARS-CoV-2 infection  and should not be used as the sole basis for treatment or other  patient management decisions.  A negative result may occur with  improper specimen collection / handling, submission of specimen other  than nasopharyngeal swab, presence of viral mutation(s) within the  areas targeted by this assay, and inadequate number of viral copies  (<250 copies / mL). A negative result must be combined with clinical  observations, patient history, and epidemiological information. If result is POSITIVE SARS-CoV-2 target nucleic acids are DETECTED. The SARS-CoV-2 RNA is generally detectable in upper and lower  respiratory specimens dur ing the acute phase of infection.  Positive  results are indicative of active infection with SARS-CoV-2.  Clinical  correlation with patient history and other diagnostic information is  necessary to determine patient infection status.  Positive results do  not rule out bacterial infection or co-infection with other viruses. If result is PRESUMPTIVE POSTIVE SARS-CoV-2 nucleic acids MAY BE PRESENT.   A presumptive positive result was obtained on the submitted specimen  and confirmed on repeat testing.  While 2019 novel coronavirus  (SARS-CoV-2) nucleic acids may be present in the submitted sample  additional confirmatory testing may be necessary for epidemiological  and / or clinical management purposes  to differentiate between  SARS-CoV-2 and other Sarbecovirus currently known to infect humans.  If clinically indicated additional testing  with an alternate test  methodology [phone]) is advised. The SARS-CoV-2 RNA is generally  detectable in upper and lower respiratory sp ecimens during the acute  phase of infection. The expected result is Negative. Fact Sheet for Patients:  BoilerBrush.com.cyhttps://www.fda.gov/media/136312/download Fact Sheet for Healthcare Providers: https://pope.com/https://www.fda.gov/media/136313/download This test is not yet approved or cleared by the [place] FDA and has been authorized for detection and/or diagnosis of SARS-CoV-2 by FDA under an Emergency Use Authorization (EUA).  This EUA will remain in effect (meaning this test can be used) for the duration of the COVID-19 declaration under Section 564(b)(1) of the Act, 21 U.S.C. section 360bbb-3(b)(1), unless the authorization is terminated or revoked sooner. Performed at [facility], [address] [place], [place] [address]   MRSA PCR Screening     Status: None   Collection Time: 02/03/19  8:40 PM   Specimen: Nasal Mucosa; Nasopharyngeal  Result Value Ref Range Status   MRSA by PCR NEGATIVE NEGATIVE Final    Comment:        The GeneXpert MRSA Assay (FDA approved for NASAL specimens only), is one component of a comprehensive MRSA colonization surveillance program. It is not intended to diagnose MRSA infection nor to guide or monitor treatment for MRSA infections. Performed at [facility], [address] [place], [place] [address]          Radiology Studies: No results found.      Scheduled Meds: . Chlorhexidine Gluconate Cloth  6 each Topical Q0600  . metoprolol tartrate  50 mg Oral BID  . nicotine  21 mg Transdermal Daily  . pantoprazole  40 mg Oral Daily  . polyethylene glycol  17 g Oral Daily  . rosuvastatin  40 mg Oral QPM  . sertraline  50 mg Oral Daily  . thiamine  100 mg Oral Daily   Continuous Infusions:   LOS: 6 days    Time spent: 25 min    Vonzella NippleAnupama Chrystle Murillo, MD [facility] Pager on amion If 7PM-7AM,  please contact night-coverage [email or web address] Password [email or web address] 02/09/2019, 4:46 PM

## 2019-02-10 DIAGNOSIS — I10 Essential (primary) hypertension: Secondary | ICD-10-CM

## 2019-02-10 DIAGNOSIS — F1010 Alcohol abuse, uncomplicated: Secondary | ICD-10-CM

## 2019-02-10 DIAGNOSIS — I951 Orthostatic hypotension: Secondary | ICD-10-CM

## 2019-02-10 LAB — MAGNESIUM: Magnesium: 1.9 mg/dL (ref 1.7–2.4)

## 2019-02-10 MED ORDER — SODIUM CHLORIDE 0.9 % IV BOLUS
1000.0000 mL | Freq: Once | INTRAVENOUS | Status: AC
  Administered 2019-02-10: 1000 mL via INTRAVENOUS

## 2019-02-10 MED ORDER — ASPIRIN EC 81 MG PO TBEC: 81.0000 mg | DELAYED_RELEASE_TABLET | Freq: Every day | ORAL | Status: AC

## 2019-02-10 MED ORDER — METOPROLOL TARTRATE 50 MG PO TABS: 25.0000 mg | ORAL_TABLET | Freq: Two times a day (BID) | ORAL | 0 refills | Status: AC

## 2019-02-10 NOTE — Discharge Summary (Signed)
Physician Discharge Summary  Dustin Velazquez ZOX:[id number] DOB: 08/17/1960 DOA: 02/03/2019  PCP: Jamelle Haring, MD  Admit date: 02/03/2019 Discharge date: 02/10/2019  Admitted From: Home Disposition: Home  Recommendations for Outpatient Follow-up:  1. Follow up with PCP in 1 week 2. Please obtain BMP/CBC in one week 3. Please follow up on the following pending results: None  Home Health: PT, OT Equipment/Devices: 3 in 1, tub/shower stool/bench, walker  Discharge Condition: Stable CODE STATUS: Full code Diet recommendation: Heart healthy   Brief/Interim Summary:  Admission HPI written by Alyson Reedy, MD   History of present illness   History obtained from chart   58 yo M PMH EtOH abuse (last relapse 7/4), tobacco use, GERD, HTN, HLD, MDD, NSTEMI with stent placement (on Brilinta?), who presents 7/29 after sustaining a fall 7/28 at night. The patient was reportedly exiting shower, fell and hit head on toilet seat. Patient endorses + LOC. Patient reportedly fell again due to dizziness/LOC. Patient states he takes Brilinta for heart stents. Patient denies recent EtOH use, citing last use approx 3 weeks ago. Of note, patient discharged from ED with short course of Librium on 7/5, following by Dr. Dorris Fetch. Appears patient has missed follow up appointments the past several days, with telephone encounter notes citing nausea, diarrhea.  Depression reportedly worse in setting of marital separation approx 6 months ago. Patient endorses weight loss and poor appetite. Patient denies SOB, chest pain, night sweats.  In ED patient taken for CT head, maxillofacial, and Cspine. CT head reveals SAH. CT Mf and Cspine unremarkable. ED reportedly has consulted NSGY and patient is deemed not surgical candidate. NSGY requesting q1hr neurochecks. PCCM to admit.   Other significant ED findings include Na 118 and Cr 1.8. DDAVP given in ED for Brilinta, and isotonic saline started for  chronic hyponatremia.      Hospital course:  Subarachnoid hemorrhage Secondary to trauma in setting of aspirin and Brilinta use. Patient managed in the ICU initially on DDAVP and frequent neuro checks. Neurosurgery consulted and recommended no surgery intervention. BP meds held. Repeat CT head scans significant for stable hemorrhage. Recommendations per neurosurgery to restart home aspirin in 2 weeks and to discontinue Brilinta. Patient to be discharged with home health services.  Hyponatremia In setting of SAH. Also concern for dehydration. Patient managed with DDAVP and IV fluids. Presenting sodium of 118 which improved to 134 prior to discharge.  Orthostatic hypotension Secondary to dehydration in addition to BP medication. Resolved with IV fluids. Will decrease to metoprolol 25 mg BID on discharge.  Essential hypertension Hydrochlorothiazide, lisinopril discontinued. Will continue metoprolol at reduced dose as mentioned above.  Alcohol dependence Patient managed on CIWA  Discharge Diagnoses:  Active Problems:   Hyponatremia   Subarachnoid bleed [facility])    Discharge Instructions   Allergies as of 02/10/2019      Reactions   Compazine [prochlorperazine Edisylate] Other (See Comments)   Seizures      Medication List    STOP taking these medications   hydrochlorothiazide 25 MG tablet Commonly known as: HYDRODIURIL   lisinopril 40 MG tablet Commonly known as: ZESTRIL   naltrexone 50 MG tablet Commonly known as: DEPADE   pantoprazole 40 MG tablet Commonly known as: PROTONIX   ticagrelor 60 MG Tabs tablet Commonly known as: BRILINTA     TAKE these medications   aspirin EC 81 MG tablet Take 1 tablet (81 mg total) by mouth daily. Restart on 02/24/2019 Start taking on: February 24, 2019 What changed:   additional instructions  These instructions start on February 24, 2019. If you are unsure what to do until then, ask your doctor or other care provider.   metoprolol  tartrate 50 MG tablet Commonly known as: LOPRESSOR Take 0.5 tablets (25 mg total) by mouth 2 (two) times daily. What changed: how much to take   omeprazole 20 MG capsule Commonly known as: PRILOSEC Take 20 mg by mouth 2 (two) times a day.   rosuvastatin 40 MG tablet Commonly known as: CRESTOR Take 1 tablet (40 mg total) by mouth daily. What changed: when to take this   sertraline 50 MG tablet Commonly known as: ZOLOFT Take 50 mg by mouth daily.   thiamine 100 MG tablet Take 1 tablet (100 mg total) by mouth daily.            Durable Medical Equipment  (From admission, onward)         Start     Ordered   02/10/19 1427  For home use only DME Shower stool  Once     02/10/19 1426   02/10/19 1427  For home use only DME Tub bench  Once     02/10/19 1426   02/10/19 1426  For home use only DME 3 n 1  Once     02/10/19 1426   02/09/19 1209  For home use only DME 4 wheeled rolling walker with seat  Once    Question:  Patient needs a walker to treat with the following condition  Answer:  Weakness   02/09/19 [place] Follow up.   Why: Home Health PT arranged. Start date of Friday 8/7 Contact information: [phone]       [facility] Follow up.   Why: Walker to be delivered to room prior to discharge.       Lebron Conners D, MD. Schedule an appointment as soon as possible for a visit in 1 week(s).   Specialty: Internal Medicine Contact information: [place] [address] [phone]          Allergies  Allergen Reactions   Compazine [Prochlorperazine Edisylate] Other (See Comments)    Seizures     Consultations:  PCCM  Neurosurgery   Procedures/Studies: Dg Chest 2 View  Result Date: 02/03/2019 CLINICAL DATA:  Syncopal episode. EXAM: CHEST - 2 VIEW COMPARISON:  01/03/2019 FINDINGS: Unchanged cardiac silhouette and mediastinal contours. Retrocardiac air and fluid  containing structure is unchanged compatible with a hiatal hernia. Grossly unchanged bilateral infrahilar heterogeneous opacities favored to represent atelectasis. No discrete focal airspace opacities. No pleural effusion or pneumothorax. No evidence of edema. No acute osseous abnormalities. Stigmata of DISH within the lower thoracic spine. IMPRESSION: 1.  No acute cardiopulmonary disease. 2. Hiatal hernia, unchanged. Electronically Signed   By: Sandi Mariscal M.D.   On: 02/03/2019 16:50   Ct Head Wo Contrast  Result Date: 02/04/2019 CLINICAL DATA:  Follow-up intracranial hemorrhage. History of fall, anticoagulation EXAM: CT HEAD WITHOUT CONTRAST TECHNIQUE: Contiguous axial images were obtained from the base of the skull through the vertex without intravenous contrast. COMPARISON:  CT head 02/04/2019 FINDINGS: Brain: Subarachnoid hemorrhage in the anterior interhemispheric fissure is unchanged from the prior study. No new hemorrhage. No subdural hematoma or midline shift. Ventricle size normal. Negative for acute infarct. Vascular: Negative for hyperdense vessel Skull: Negative for skull fracture. Extensive soft tissue swelling left scalp. Sinuses/Orbits:  Paranasal sinuses clear. Left mastoid effusion. Negative orbit. Other: None IMPRESSION: Anterior subarachnoid hemorrhage is stable.  No new hemorrhage Left mastoid effusion unchanged. Electronically Signed   By: Charles  Clark M.D.   On: 02/04/2019 17:3Marlan Palau0   Ct Head Wo Contrast  Result Date: 02/04/2019 CLINICAL DATA:  Follow-up subarachnoid hemorrhage EXAM: CT HEAD WITHOUT CONTRAST TECHNIQUE: Contiguous axial images were obtained from the base of the skull through the vertex without intravenous contrast. COMPARISON:  Yesterday FINDINGS: Brain: Fading interhemispheric subarachnoid hemorrhage. No new hemorrhage, hydrocephalus, or infarct. Stable small low-density in the right internal capsule. Vascular: Atherosclerotic plaque. Skull: Subgaleal hematoma which has  thickened. No calvarial fracture. Left mastoid opacification that is chronic based on prior imaging. Sinuses/Orbits: Negative IMPRESSION: 1. Decreasing subarachnoid hemorrhage in the interhemispheric fissure. 2. Subgaleal swelling with progression. Electronically Signed   By: Marnee SpringJonathon  Watts M.D.   On: 02/04/2019 10:06   Ct Head Wo Contrast  Result Date: 02/03/2019 CLINICAL DATA:  Fall, head trauma, anticoagulation EXAM: CT HEAD WITHOUT CONTRAST CT MAXILLOFACIAL WITHOUT CONTRAST CT CERVICAL SPINE WITHOUT CONTRAST TECHNIQUE: Multidetector CT imaging of the head, cervical spine, and maxillofacial structures were performed using the standard protocol without intravenous contrast. Multiplanar CT image reconstructions of the cervical spine and maxillofacial structures were also generated. COMPARISON:  None. FINDINGS: CT HEAD FINDINGS Brain: There is subarachnoid hemorrhage about the medial bilateral frontal lobes and falx (series 4, image 23). Vascular: No hyperdense vessel or unexpected calcification. CT FACIAL BONES FINDINGS Skull: Normal. Negative for fracture or focal lesion. Facial bones: No displaced fractures or dislocations. Sinuses/Orbits: No acute finding. Other: Large left parietal and right occipital scalp hematomas. Soft tissue contusion about the left cheek, orbit, and forehead. CT CERVICAL SPINE FINDINGS Alignment: Straightening of the normal cervical lordosis. Skull base and vertebrae: No acute fracture. No primary bone lesion or focal pathologic process. Soft tissues and spinal canal: No prevertebral fluid or swelling. No visible canal hematoma. Disc levels: Mild disc space height loss and osteophytosis of C5 through C7. Upper chest: Negative. Other: None. IMPRESSION: 1. There is subarachnoid hemorrhage about the medial bilateral frontal lobes and falx (series 4, image 23). 2.  No displaced fracture or dislocation of the facial bones. 3. Large left parietal and right occipital scalp hematomas. Soft  tissue contusion about the left cheek, orbit, and forehead. 4.  No fracture or static subluxation of the cervical spine. These results were called by telephone at the time of interpretation on 02/03/2019 at 4:48 pm to Dr. Chaney MallingAVID YAO , who verbally acknowledged these results. Electronically Signed   By: Lauralyn PrimesAlex  Bibbey M.D.   On: 02/03/2019 16:57   Ct Cervical Spine Wo Contrast  Result Date: 02/03/2019 CLINICAL DATA:  Fall, head trauma, anticoagulation EXAM: CT HEAD WITHOUT CONTRAST CT MAXILLOFACIAL WITHOUT CONTRAST CT CERVICAL SPINE WITHOUT CONTRAST TECHNIQUE: Multidetector CT imaging of the head, cervical spine, and maxillofacial structures were performed using the standard protocol without intravenous contrast. Multiplanar CT image reconstructions of the cervical spine and maxillofacial structures were also generated. COMPARISON:  None. FINDINGS: CT HEAD FINDINGS Brain: There is subarachnoid hemorrhage about the medial bilateral frontal lobes and falx (series 4, image 23). Vascular: No hyperdense vessel or unexpected calcification. CT FACIAL BONES FINDINGS Skull: Normal. Negative for fracture or focal lesion. Facial bones: No displaced fractures or dislocations. Sinuses/Orbits: No acute finding. Other: Large left parietal and right occipital scalp hematomas. Soft tissue contusion about the left cheek, orbit, and forehead. CT CERVICAL SPINE FINDINGS Alignment: Straightening of the normal cervical  lordosis. Skull base and vertebrae: No acute fracture. No primary bone lesion or focal pathologic process. Soft tissues and spinal canal: No prevertebral fluid or swelling. No visible canal hematoma. Disc levels: Mild disc space height loss and osteophytosis of C5 through C7. Upper chest: Negative. Other: None. IMPRESSION: 1. There is subarachnoid hemorrhage about the medial bilateral frontal lobes and falx (series 4, image 23). 2.  No displaced fracture or dislocation of the facial bones. 3. Large left parietal and right  occipital scalp hematomas. Soft tissue contusion about the left cheek, orbit, and forehead. 4.  No fracture or static subluxation of the cervical spine. These results were called by telephone at the time of interpretation on 02/03/2019 at 4:48 pm to Dr. Chaney Malling , who verbally acknowledged these results. Electronically Signed   By: Lauralyn Primes M.D.   On: 02/03/2019 16:57   Dg Chest Port 1 View  Result Date: 02/06/2019 CLINICAL DATA:  Respiratory failure EXAM: PORTABLE CHEST 1 VIEW COMPARISON:  Chest radiograph 02/03/2019 FINDINGS: Monitoring leads overlie the patient. Stable cardiac and mediastinal contours. Low lung volumes. Bibasilar heterogeneous opacities. No pleural effusion or pneumothorax. IMPRESSION: Bibasilar opacities favored to represent atelectasis. Electronically Signed   By: Annia Belt M.D.   On: 02/06/2019 06:14   Dg Shoulder Left  Result Date: 02/03/2019 CLINICAL DATA:  Loss consciousness in the shower last evening suffering a fall, now with left shoulder pain and bruising. EXAM: LEFT SHOULDER - 2+ VIEW COMPARISON:  None. FINDINGS: No fracture or dislocation. Glenohumeral joint spaces appear preserved. Mild degenerative change of the left AC joint with joint space loss, subchondral sclerosis and inferiorly directed osteophytosis. No evidence of calcific tendinitis. Limited visualization of the adjacent thorax is normal. Regional soft tissues appear normal. IMPRESSION: 1. No acute findings. 2. Mild degenerative change of the left AC joint. Electronically Signed   By: Simonne Come M.D.   On: 02/03/2019 16:48   Ct Maxillofacial Wo Contrast  Result Date: 02/03/2019 CLINICAL DATA:  Fall, head trauma, anticoagulation EXAM: CT HEAD WITHOUT CONTRAST CT MAXILLOFACIAL WITHOUT CONTRAST CT CERVICAL SPINE WITHOUT CONTRAST TECHNIQUE: Multidetector CT imaging of the head, cervical spine, and maxillofacial structures were performed using the standard protocol without intravenous contrast. Multiplanar CT  image reconstructions of the cervical spine and maxillofacial structures were also generated. COMPARISON:  None. FINDINGS: CT HEAD FINDINGS Brain: There is subarachnoid hemorrhage about the medial bilateral frontal lobes and falx (series 4, image 23). Vascular: No hyperdense vessel or unexpected calcification. CT FACIAL BONES FINDINGS Skull: Normal. Negative for fracture or focal lesion. Facial bones: No displaced fractures or dislocations. Sinuses/Orbits: No acute finding. Other: Large left parietal and right occipital scalp hematomas. Soft tissue contusion about the left cheek, orbit, and forehead. CT CERVICAL SPINE FINDINGS Alignment: Straightening of the normal cervical lordosis. Skull base and vertebrae: No acute fracture. No primary bone lesion or focal pathologic process. Soft tissues and spinal canal: No prevertebral fluid or swelling. No visible canal hematoma. Disc levels: Mild disc space height loss and osteophytosis of C5 through C7. Upper chest: Negative. Other: None. IMPRESSION: 1. There is subarachnoid hemorrhage about the medial bilateral frontal lobes and falx (series 4, image 23). 2.  No displaced fracture or dislocation of the facial bones. 3. Large left parietal and right occipital scalp hematomas. Soft tissue contusion about the left cheek, orbit, and forehead. 4.  No fracture or static subluxation of the cervical spine. These results were called by telephone at the time of interpretation on 02/03/2019 at  4:48 pm to Dr. Chaney Malling , who verbally acknowledged these results. Electronically Signed   By: Lauralyn Primes M.D.   On: 02/03/2019 16:57     Subjective: Some dizziness this morning with standing. Patient received a bolus and no further symptoms.  Discharge Exam: Vitals:   02/10/19 0907 02/10/19 1337  BP: 128/90 119/70  Pulse: (!) 102 85  Resp: 18 18  Temp:    SpO2: 99%    Vitals:   02/10/19 0545 02/10/19 0707 02/10/19 0907 02/10/19 1337  BP: 129/79  128/90 119/70  Pulse: 80   (!) 102 85  Resp: Temp: 98.4 F (36.9 C)     TempSrc:      SpO2: 98%  99%   Weight:  80.5 kg    Height:        General: Pt is alert, awake, not in acute distress Cardiovascular: RRR, S1/S2 +, no rubs, no gallops Respiratory: CTA bilaterally, no wheezing, no rhonchi Abdominal: Soft, NT, ND, bowel sounds + Extremities: no edema, no cyanosis    The results of significant diagnostics from this hospitalization (including imaging, microbiology, ancillary and laboratory) are listed below for reference.     Microbiology: Recent Results (from the past 240 hour(s))  SARS Coronavirus 2 (CEPHEID - Performed in [facility] hospital lab), Hosp Order     Status: None   Collection Time: 02/03/19  5:22 PM   Specimen: Nasopharyngeal Swab  Result Value Ref Range Status   SARS Coronavirus 2 NEGATIVE NEGATIVE Final    Comment: (NOTE) If result is NEGATIVE SARS-CoV-2 target nucleic acids are NOT DETECTED. The SARS-CoV-2 RNA is generally detectable in upper and lower  respiratory specimens during the acute phase of infection. The lowest  concentration of SARS-CoV-2 viral copies this assay can detect is 250  copies / mL. A negative result does not preclude SARS-CoV-2 infection  and should not be used as the sole basis for treatment or other  patient management decisions.  A negative result may occur with  improper specimen collection / handling, submission of specimen other  than nasopharyngeal swab, presence of viral mutation(s) within the  areas targeted by this assay, and inadequate number of viral copies  (<250 copies / mL). A negative result must be combined with clinical  observations, patient history, and epidemiological information. If result is POSITIVE SARS-CoV-2 target nucleic acids are DETECTED. The SARS-CoV-2 RNA is generally detectable in upper and lower  respiratory specimens dur ing the acute phase of infection.  Positive  results are indicative of active infection  with SARS-CoV-2.  Clinical  correlation with patient history and other diagnostic information is  necessary to determine patient infection status.  Positive results do  not rule out bacterial infection or co-infection with other viruses. If result is PRESUMPTIVE POSTIVE SARS-CoV-2 nucleic acids MAY BE PRESENT.   A presumptive positive result was obtained on the submitted specimen  and confirmed on repeat testing.  While 2019 novel coronavirus  (SARS-CoV-2) nucleic acids may be present in the submitted sample  additional confirmatory testing may be necessary for epidemiological  and / or clinical management purposes  to differentiate between  SARS-CoV-2 and other Sarbecovirus currently known to infect humans.  If clinically indicated additional testing with an alternate test  methodology [phone]) is advised. The SARS-CoV-2 RNA is generally  detectable in upper and lower respiratory sp ecimens during the acute  phase of infection. The expected result is Negative. Fact Sheet for Patients:  BoilerBrush.com.cy Fact  Sheet for Healthcare Providers: https://pope.com/ This test is not yet approved or cleared by the [place] and has been authorized for detection and/or diagnosis of SARS-CoV-2 by FDA under an Emergency Use Authorization (EUA).  This EUA will remain in effect (meaning this test can be used) for the duration of the COVID-19 declaration under Section 564(b)(1) of the Act, 21 U.S.C. section 360bbb-3(b)(1), unless the authorization is terminated or revoked sooner. Performed at [facility], [address], [place], [place] [address]   MRSA PCR Screening     Status: None   Collection Time: 02/03/19  8:40 PM   Specimen: Nasal Mucosa; Nasopharyngeal  Result Value Ref Range Status   MRSA by PCR NEGATIVE NEGATIVE Final    Comment:        The GeneXpert MRSA Assay (FDA approved for NASAL specimens only), is one component  of a comprehensive MRSA colonization surveillance program. It is not intended to diagnose MRSA infection nor to guide or monitor treatment for MRSA infections. Performed at [facility], [address] [place], [place] [address]      Labs: BNP (last 3 results) No results for input(s): BNP in the last 8760 hours. Basic Metabolic Panel: Recent Labs  Lab 02/03/19 2047  02/04/19 1043  02/05/19 0214  02/06/19 0156 02/06/19 1124 02/07/19 0245 02/08/19 0307 02/09/19 0326 02/10/19 0338  NA 120*   < > 122*   < > 119*   < > 126* 130* 131* 132* 134*  --   K 3.4*   < > 3.6   < > 3.5   < > 3.3* 3.3* 3.0* 3.7 3.7  --   CL 82*   < > 83*   < > 82*   < > 90* 96* 95* 98 98  --   CO2 22   < > 24   < > 26   < > --   GLUCOSE 119*   < > 119*   < > 134*   < > 112* 106* 106* 104* 112*  --   BUN 17   < > 17   < > 19   < > --   CREATININE 1.39*   < > 1.05   < > 0.97   < > 0.85 0.82 0.74 0.78 0.66  --   CALCIUM 7.8*   < > 8.4*   < > 8.5*   < > 8.3* 8.7* 8.9 8.8* 8.8*  --   MG 1.5*  --  1.8  --  2.2  --   --   --   --  1.4* 1.6* 1.9  PHOS 2.3*  --   --   --  2.5  --   --   --   --  2.1* 4.9*  --    < > = values in this interval not displayed.   Liver Function Tests: Recent Labs  Lab 02/03/19 1530  AST 143*  ALT 116*  ALKPHOS 65  BILITOT 1.9*  PROT 6.3*  ALBUMIN 3.6   No results for input(s): LIPASE, AMYLASE in the last 168 hours. No results for input(s): AMMONIA in the last 168 hours. CBC: Recent Labs  Lab 02/03/19 1530  02/04/19 0255 02/05/19 0214 02/06/19 0156 02/08/19 0307 02/09/19 0326  WBC 12.5*   < > 10.6* 11.7* 11.3* 13.5* 12.4*  NEUTROABS 11.0*  --  8.8*  --   --  9.1*  --  HGB 11.3*   < > 9.7* 8.6* 8.2* 8.5* 8.2*  HCT 31.6*   < > 26.8* 23.9* 23.2* 25.0* 24.3*  MCV 91.9   < > 91.5 92.6 95.5 98.0 99.6  PLT 221   < > 192 163 189 280 294   < > = values in this interval not displayed.   Cardiac Enzymes: Recent Labs  Lab  02/03/19 2047 02/04/19 0255 02/04/19 1043 02/05/19 0214 02/06/19 0156  CKTOTAL 1,973* 1,718* 1,442* 893* 391   BNP: Invalid input(s): POCBNP CBG: Recent Labs  Lab 02/04/19 0720 02/04/19 2324  GLUCAP 123* 143*   D-Dimer No results for input(s): DDIMER in the last 72 hours. Hgb A1c No results for input(s): HGBA1C in the last 72 hours. Lipid Profile No results for input(s): CHOL, HDL, LDLCALC, TRIG, CHOLHDL, LDLDIRECT in the last 72 hours. Thyroid function studies No results for input(s): TSH, T4TOTAL, T3FREE, THYROIDAB in the last 72 hours.  Invalid input(s): FREET3 Anemia work up No results for input(s): VITAMINB12, FOLATE, FERRITIN, TIBC, IRON, RETICCTPCT in the last 72 hours. Urinalysis    Component Value Date/Time   COLORURINE STRAW (A) 09/05/2018 1941   APPEARANCEUR CLEAR (A) 09/05/2018 1941   LABSPEC 1.004 (L) 09/05/2018 1941   PHURINE 5.0 09/05/2018 1941   GLUCOSEU NEGATIVE 09/05/2018 1941   HGBUR NEGATIVE 09/05/2018 1941   BILIRUBINUR NEGATIVE 09/05/2018 1941   KETONESUR NEGATIVE 09/05/2018 1941   PROTEINUR NEGATIVE 09/05/2018 1941   NITRITE NEGATIVE 09/05/2018 1941   LEUKOCYTESUR NEGATIVE 09/05/2018 1941   Sepsis Labs Invalid input(s): PROCALCITONIN,  WBC,  LACTICIDVEN Microbiology Recent Results (from the past 240 hour(s))  SARS Coronavirus 2 (CEPHEID - [facility] lab), Hosp Order     Status: None   Collection Time: 02/03/19  5:22 PM   Specimen: Nasopharyngeal Swab  Result Value Ref Range Status   SARS Coronavirus 2 NEGATIVE NEGATIVE Final    Comment: (NOTE) If result is NEGATIVE SARS-CoV-2 target nucleic acids are NOT DETECTED. The SARS-CoV-2 RNA is generally detectable in upper and lower  respiratory specimens during the acute phase of infection. The lowest  concentration of SARS-CoV-2 viral copies this assay can detect is 250  copies / mL. A negative result does not preclude SARS-CoV-2 infection  and should not be used as  the sole basis for treatment or other  patient management decisions.  A negative result may occur with  improper specimen collection / handling, submission of specimen other  than nasopharyngeal swab, presence of viral mutation(s) within the  areas targeted by this assay, and inadequate number of viral copies  (<250 copies / mL). A negative result must be combined with clinical  observations, patient history, and epidemiological information. If result is POSITIVE SARS-CoV-2 target nucleic acids are DETECTED. The SARS-CoV-2 RNA is generally detectable in upper and lower  respiratory specimens dur ing the acute phase of infection.  Positive  results are indicative of active infection with SARS-CoV-2.  Clinical  correlation with patient history and other diagnostic information is  necessary to determine patient infection status.  Positive results do  not rule out bacterial infection or co-infection with other viruses. If result is PRESUMPTIVE POSTIVE SARS-CoV-2 nucleic acids MAY BE PRESENT.   A presumptive positive result was obtained on the submitted specimen  and confirmed on repeat testing.  While 2019 novel coronavirus  (SARS-CoV-2) nucleic acids may be present in the submitted sample  additional confirmatory testing may be necessary for epidemiological  and / or clinical management purposes  to differentiate between  SARS-CoV-2 and other Sarbecovirus currently known to infect humans.  If clinically indicated additional testing with an alternate test  methodology [phone]([id number]) is advised. The SARS-CoV-2 RNA is generally  detectable in upper and lower respiratory sp ecimens during the acute  phase of infection. The expected result is Negative. Fact Sheet for Patients:  BoilerBrush.com.cyhttps://www.fda.gov/media/136312/download Fact Sheet for Healthcare Providers: https://pope.com/https://www.fda.gov/media/136313/download This test is not yet approved or cleared by the [place] FDA and has been authorized for  detection and/or diagnosis of SARS-CoV-2 by FDA under an Emergency Use Authorization (EUA).  This EUA will remain in effect (meaning this test can be used) for the duration of the COVID-19 declaration under Section 564(b)(1) of the Act, 21 U.S.C. section 360bbb-3(b)(1), unless the authorization is terminated or revoked sooner. Performed at [facility], [address] [place], [place] [address]   MRSA PCR Screening     Status: None   Collection Time: 02/03/19  8:40 PM   Specimen: Nasal Mucosa; Nasopharyngeal  Result Value Ref Range Status   MRSA by PCR NEGATIVE NEGATIVE Final    Comment:        The GeneXpert MRSA Assay (FDA approved for NASAL specimens only), is one component of a comprehensive MRSA colonization surveillance program. It is not intended to diagnose MRSA infection nor to guide or monitor treatment for MRSA infections. Performed at [facility], [address] [place], [place] [address]      Time coordinating discharge: 35 minutes  SIGNED:   Jacquelin Hawkingalph Viveca Beckstrom, MD [facility] 02/10/2019, 2:47 PM

## 2019-02-10 NOTE — Progress Notes (Signed)
Physical Therapy Treatment and Vestibular assessment Patient Details Name: Dustin Velazquez MRN: [id number] DOB: 27-Sep-1960 Today's Date: 02/10/2019    History of Present Illness Pt adm after fall with hyponatremia and medial bilateral SAH. Course complicated by delirium and ETOH withdrawal. Pt with in hospital fall on 7/30. PMH - etoh abuse, HTN, CAD    PT Comments    Patient referred by PT for a vestibular assessment due to dizziness with mobility on 8/4. Assessment revealed pt is symptomatic with orthostasis (see vitals flowsheet for all vitals).  Standing exercises did not improve BP (dropped further), ambulating causes small increase in SBP, however further incr HR (92 to max 126). RN made aware and pt up in chair with feet flat on the floor. Educated on need for increased activity in upright positions. Patient expressed fear of discharging home today with BP issues and lightheadedness. Educated I would share this information with his RN and MD.    Follow Up Recommendations  Home health PT;Supervision/Assistance - 24 hour(at or near 24 hours)     Equipment Recommendations  3in1 (PT);Other (comment)(Rollator)    Recommendations for Other Services       Precautions / Restrictions Precautions Precautions: Fall;Other (comment) Precaution Comments: check orthostatics Restrictions Weight Bearing Restrictions: No      02/10/19 0822  Vestibular Assessment  General Observation started with fall and hit my head  Symptom Behavior  Subjective history of current problem only when up on my feet  Type of Dizziness  Lightheadedness;Imbalance  Frequency of Dizziness each time up  Duration of Dizziness unsure  Symptom Nature Positional  Aggravating Factors Sit to stand  Relieving Factors Lying supine  Progression of Symptoms No change since onset  History of similar episodes no  Oculomotor Exam  Oculomotor Alignment Normal  Ocular ROM normal  Spontaneous Absent  Gaze-induced  Absent   Smooth Pursuits Intact  Saccades Intact  Comment asymptomatic  Orthostatics  Orthostatics Comment see vitals flowsheet    Mobility  Bed Mobility Overal bed mobility: Independent Bed Mobility: Supine to Sit     Supine to sit: Independent        Transfers Overall transfer level: Needs assistance Equipment used: None Transfers: Sit to/from Stand Sit to Stand: Supervision;Min guard         General transfer comment: minguard-S for safety due to lightheadedness  Ambulation/Gait Ambulation/Gait assistance: Min guard Gait Distance (Feet): 60 Feet Assistive device: None Gait Pattern/deviations: Step-through pattern;Decreased step length - right;Decreased step length - left;Decreased stride length;Wide base of support     General Gait Details: Cues to self-monitor for activity tolerance; Reported dizziness   Stairs             Wheelchair Mobility    Modified Rankin (Stroke Patients Only)       Balance Overall balance assessment: Needs assistance Sitting-balance support: No upper extremity supported;Feet supported Sitting balance-Leahy Scale: Good     Standing balance support: No upper extremity supported Standing balance-Leahy Scale: Fair                              Cognition Arousal/Alertness: Awake/alert Behavior During Therapy: WFL for tasks assessed/performed Overall Cognitive Status: Within Functional Limits for tasks assessed                                        Exercises Other  Exercises Other Exercises: in standing bil UE overhead presses, lateral punches in effort to incr BP    General Comments General comments (skin integrity, edema, etc.): see vitals flowsheet for +orthostatic BPs      Pertinent Vitals/Pain Pain Assessment: No/denies pain Faces Pain Scale: No hurt    Home Living                      Prior Function            PT Goals (current goals can now be found in the care plan  section) Acute Rehab PT Goals Patient Stated Goal: to get stronger Time For Goal Achievement: 02/19/19 Potential to Achieve Goals: Good Progress towards PT goals: Progressing toward goals    Frequency    Min 3X/week      PT Plan Current plan remains appropriate    Co-evaluation              AM-PAC PT "6 Clicks" Mobility   Outcome Measure  Help needed turning from your back to your side while in a flat bed without using bedrails?: None Help needed moving from lying on your back to sitting on the side of a flat bed without using bedrails?: None Help needed moving to and from a bed to a chair (including a wheelchair)?: A Little Help needed standing up from a chair using your arms (e.g., wheelchair or bedside chair)?: A Little Help needed to walk in hospital room?: A Little Help needed climbing 3-5 steps with a railing? : A Little 6 Click Score: 20    End of Session Equipment Utilized During Treatment: Gait belt Activity Tolerance: Treatment limited secondary to medical complications (Comment)(orthostasis) Patient left: with call bell/phone within reach;in chair;with chair alarm set Nurse Communication: Mobility status;Other (comment)(orthostasis; pt concerned re: ?d/c while dizzy) PT Visit Diagnosis: Other abnormalities of gait and mobility (R26.89);Muscle weakness (generalized) (M62.81);History of falling (Z91.81)     Time: 8127-5170 PT Time Calculation (min) (ACUTE ONLY): 43 min  Charges:  $Gait Training: 8-22 mins $Therapeutic Activity: 23-37 mins                       Barry Brunner, PT       Rexanne Mano 02/10/2019, 9:13 AM

## 2019-02-10 NOTE — Progress Notes (Signed)
Charisse March to be D/C'd home  per MD order.  Discussed with the patient and all questions fully answered.  VSS, Skin clean, dry and intact without evidence of skin break down, no evidence of skin tears noted. IV catheter discontinued intact. Site without signs and symptoms of complications. Dressing and pressure applied.  An After Visit Summary was printed and given to the patient. Patient received prescription.  D/c education completed with patient/family including follow up instructions, medication list, d/c activities limitations if indicated, with other d/c instructions as indicated by MD - patient able to verbalize understanding, all questions fully answered.   Patient instructed to return to ED, call 911, or call MD for any changes in condition.   Patient escorted via [place], and D/C home via private auto.    Ji Feldner A Odetta Pink Montejano 02/10/2019 3:12 PM.

## 2019-02-10 NOTE — Progress Notes (Signed)
Occupational Therapy Treatment Patient Details Name: Dustin Velazquez MRN: [id number] DOB: 1961-01-01 Today's Date: 02/10/2019    History of present illness Pt adm after fall with hyponatremia and medial bilateral SAH. Course complicated by delirium and ETOH withdrawal. Pt with in hospital fall on 7/30. PMH - etoh abuse, HTN, CAD   OT comments  Pt in bed upon arrival and reports that he is tired form PT session earlier and still feels dizzy. Pt declined OOB activity with OT, but agreeable to bathroom home safety education/instruction. Pt educated on bathroom safety and home use of tub bench/seat and grab bars in shower. Pt provided with handouts. Pt seemed resistant to idea of grab bars in the tub shower and stated that hie bathroom is too small for a tub bench to fit. Pt provided with education/handouts for tub benches, seats and grab bar placement. OT will continue to follow  Follow Up Recommendations  Home health OT;Supervision/Assistance - 24 hour    Equipment Recommendations  3 in 1 bedside commode;Tub/shower seat;Tub/shower bench;Other (comment)(reacher)    Recommendations for Other Services      Precautions / Restrictions Precautions Precautions: Fall Precaution Comments: check orthostatics Restrictions Weight Bearing Restrictions: No       Mobility Bed Mobility Overal bed mobility: Independent Bed Mobility: Supine to Sit     Supine to sit: Independent     General bed mobility comments: pt declined sitting EOB, stated that he still feels dizzy, but wants to go home  Transfers Overall transfer level: Needs assistance Equipment used: None Transfers: Sit to/from Stand Sit to Stand: Supervision;Min guard         General transfer comment: pt declined    Balance Overall balance assessment: Needs assistance Sitting-balance support: No upper extremity supported;Feet supported Sitting balance-Leahy Scale: Good Sitting balance - Comments: pt declined   Standing  balance support: No upper extremity supported Standing balance-Leahy Scale: Fair                             ADL either performed or assessed with clinical judgement   ADL Overall ADL's : Needs assistance/impaired                                       General ADL Comments: Pt educated on bathroom safety and home use of tub bench/seat and grab bars in shower. Pt provided with handouts     Vision Patient Visual Report: No change from baseline     Perception     Praxis      Cognition Arousal/Alertness: Awake/alert Behavior During Therapy: WFL for tasks assessed/performed Overall Cognitive Status: Within Functional Limits for tasks assessed                                          Exercises Exercises: Other exercises Other Exercises Other Exercises: in standing bil UE overhead presses, lateral punches in effort to incr BP   Shoulder Instructions       General Comments see vitals flowsheet for +orthostatic BPs    Pertinent Vitals/ Pain       Pain Assessment: No/denies pain Faces Pain Scale: No hurt Pain Intervention(s): Monitored during session  Home Living  Prior Functioning/Environment              Frequency  Min 2X/week        Progress Toward Goals  OT Goals(current goals can now be found in the care plan section)  Progress towards OT goals: Progressing toward goals  Acute Rehab OT Goals Patient Stated Goal: to get stronger OT Goal Formulation: With patient Time For Goal Achievement: 02/22/19 Potential to Achieve Goals: Good  Plan Discharge plan remains appropriate    Co-evaluation                 AM-PAC OT "6 Clicks" Daily Activity     Outcome Measure   Help from another person eating meals?: A Little Help from another person taking care of personal grooming?: A Little Help from another person toileting, which includes using  toliet, bedpan, or urinal?: A Little Help from another person bathing (including washing, rinsing, drying)?: A Little Help from another person to put on and taking off regular upper body clothing?: A Little Help from another person to put on and taking off regular lower body clothing?: A Little 6 Click Score: 18    End of Session    OT Visit Diagnosis: Unsteadiness on feet (R26.81);Other abnormalities of gait and mobility (R26.89);History of falling (Z91.81)   Activity Tolerance Patient limited by fatigue;Other (comment)(dizziness)   Patient Left     Nurse Communication          Time: 2549-8264 OT Time Calculation (min): 21 min  Charges: OT General Charges $OT Visit: 1 Visit OT Treatments $Therapeutic Activity: 8-22 mins     Britt Bottom 02/10/2019, 1:00 PM

## 2019-02-10 NOTE — Discharge Instructions (Signed)
Dustin Velazquez,  You were in the hospital because of bleeding in your brain from trauma. This was made worse because of your aspirin and Brilinta therapies for your stents from one year ago. The neurosurgeon has recommended for you to stop the Brilinta and resume your aspirin in two weeks. Please discuss your Brilinta dosing with your cardiologist. You will be discharged with home health therapies and equipment.

## 2019-02-10 NOTE — Consult Note (Signed)
   [facility] CM Inpatient Consult   02/10/2019  Dustin Velazquez [birth year] [id number]   Patient was screened for extreme high risk score and for unplanned readmission. Chart was reviewed for needs. Below is MD notes from progress notes reveals: 58 year old gentleman with history of alcohol abuse, last relapse 7/4, tobacco use, GERD, hypertension, hyperlipidemia, non-STEMI with stent placement on 12/2016 on aspirin and Brilinta who presented to the emergency room on 7/29 after sustaining a fall the night before. Patient reported that he was exiting in his shower and fell on the front of his head and hit on toilet seat. Patient endorsed transient loss of consciousness. He was feeling dizzy and falling again so came to the emergency room after calling EMS. He has been on and off drinking alcohol. In the emergency room, a CT head, maxillofacial and C-spine showed subarachnoid hemorrhage without complication. He was given DDAVP because of being on Brilinta. Repeat CT scans are stable. Seen by neurosurgery and recommended supportive treatment. Also found to have sodium of 118 and creatinine of 1.8.   Patient with [facility] Patient could benefit from post hospital follow up. Patient was accepted by [facility] noted.  Plan: Found that the patient's primary care provider is not in the [employer] which makes member ineligible for [employer] services.  Natividad Brood, RN BSN [facility] Liaison  [phone] business mobile phone Toll free office [phone]  Fax number: [phone] [name]

## 2019-02-15 ENCOUNTER — Ambulatory Visit: Payer: Self-pay | Admitting: Internal Medicine

## 2019-02-15 ENCOUNTER — Encounter: Payer: Self-pay | Admitting: Internal Medicine

## 2019-02-15 ENCOUNTER — Other Ambulatory Visit: Payer: Self-pay

## 2019-02-15 VITALS — BP 136/82 | HR 99 | Temp 98.4°F | Resp 16 | Ht 69.0 in | Wt 174.0 lb

## 2019-02-15 DIAGNOSIS — F1010 Alcohol abuse, uncomplicated: Secondary | ICD-10-CM

## 2019-02-15 DIAGNOSIS — F3289 Other specified depressive episodes: Secondary | ICD-10-CM

## 2019-02-15 DIAGNOSIS — I10 Essential (primary) hypertension: Secondary | ICD-10-CM

## 2019-02-15 DIAGNOSIS — I609 Nontraumatic subarachnoid hemorrhage, unspecified: Secondary | ICD-10-CM

## 2019-02-15 DIAGNOSIS — F411 Generalized anxiety disorder: Secondary | ICD-10-CM

## 2019-02-15 NOTE — Progress Notes (Signed)
S - Patient presents for F/u after I last sawJuly 20, cleared to RTW on July 14th (following for alcohol abuse) He did not f/u at the last scheduled visit and called asked Hulan Saas to call patient and he noted he was not feeling well (nausea, diarhhea) and we noted that he needed to f/u again with me then in the next couple days.  He then was admitted to the hospital 7/29 after a fall in the bathroom noted 7/28 in the f/u note to me and he hit his head. He noted it was when he went home after work when I asked today. He had a second fall possible also noted as well due to dizziness/? LOC. He was on Brilinta for heart stents and ASA. A subarachnoid hem was dx'ed and he remained hospitalized until 02/10/2019.  He denied any recent alcohol use before the fall, noting to the ER that it had been 3 weeks since his last drink.  He today noted he is feeling much better and needs to get back to work. He noted he has not had alcohol since 7/4, and is very committed to not taking another drink. He missed his f/u appts last week with [facility] and Darl Pikes from [place] and he noted he needed to call them this week to reschedule the f/u appts missed due to being hospitalized.  He noted last visit that returning to work had been helpful and good for him, and had no concerns with job functions. 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  +tob use Alcoholabuse history- last relapse Sat, July 4th holiday  Separated with his wife about 6 months ago with depression an issue and this a major exacerbant. He notes he has been taking the zoloft and is helpful. He has been feeling better since discharge from hospital and anxious to get back to work.  Allergies  Allergen Reactions  . Compazine [Prochlorperazine Edisylate] Other (See  Comments)    Seizures    Current Outpatient Medications on File Prior to Visit  Medication Sig Dispense Refill  . [START ON 02/24/2019] aspirin EC 81 MG tablet Take 1 tablet (81 mg total) by mouth daily. Restart on 02/24/2019    . metoprolol tartrate (LOPRESSOR) 50 MG tablet Take 0.5 tablets (25 mg total) by mouth 2 (two) times daily. 30 tablet 0  . omeprazole (PRILOSEC) 20 MG capsule Take 20 mg by mouth 2 (two) times a day.    . rosuvastatin (CRESTOR) 40 MG tablet Take 1 tablet (40 mg total) by mouth daily. (Patient taking differently: Take 40 mg by mouth every evening. ) 30 tablet 1  . sertraline (ZOLOFT) 50 MG tablet Take 50 mg by mouth daily.      No current facility-administered medications on file prior to visit.    His lisinopril and HCTZ were discontinued on discharge and he remains on the metoprolol at reduced dose for his BP. He is off the Brilinta and not to take ASA for 2 weeks after discharge before resuming per the neurosurgeons.   He has had home health services after discharge (he noted PT).  Tob - still smokes daily Alcohol - denied any drink since [facility] report reviewed  CT head  - subarachnoid hem in the ant interhemispheric fissure, no subdural, no midline shift, no skull fracture Had SAH with  low sodium that was corrected before discharge Had negative Covid test in hospital Ethanol level <10 on 7/29 when admitted to hospital  O - NAD, masked,  BP 136/82 (BP Location: Right Arm, Patient Position: Sitting, Cuff Size: Large)   Pulse 99   Temp 98.4 F (36.9 C)   Resp 16   Ht 5\' 9"  (1.753 m)   Wt 174 lb (78.9 kg)   SpO2 100%   BMI 25.70 kg/m   HEENT - + glasses, sclera anicteric, + left subconj hem left eye inner aspect (medial to iris), PERRL, EOMI, acuity grossly intact in office and lateral acuity intact on testing, mild bruising persist on left side of face and neck, NT to palpate the peri-orbital and sinus regions, NT TMJ and could open and close  his jaw without pain Neck - bruising left side, NT to palpate, good ROM without limitations, NT over cervicle spine Car -RRR without m/g/r, not tachy (pulse approx 92 and reg on my exam) Pulm - CTA Abd - obese, NT, no guarding or rebound, Back - no CVA tenderness, NT lower back muscles with fading bruising right side towards abdomen Ext - no LE edema, had band aid on left wrist area with abrasion noted,  Neuro - Affect not flat, approp with conversation, speech was not rapid Grossly nonfocal Noresting tremor,very mildintention tremor with F to N testing both right and left(still slightlyworse on left) and is better today than prior visits pre-hospitalization, no past pointing, no asterixis.Romberg neg, Still struggled trying to balance on one foot, and could only do so for 1-2 seconds at best (no change from prior exam attempts pre-hospitalization), Gait normal walking back in the office.DTR's 2+ and = in patella, sensation intact to LT in ext's, good strength testing ext's including good UE and grip strength and good LE and strength with dorsi and plantar flexion  A/P -1. 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 He also is in a safety sensitive position and reviewed this case with our new occupational nurse director for the [employer] (Suzanne) previously, who noted this to me as well.  He will be able to return to work with accommodations and that is not being able to drive the street sweeper or any other  heavy vehicle for the [employer] at present and anticipate this limitation being longer term, at a minimum of at least a year noted to patient today. It could be as much as 5 years noted given the location of his bleed, and over time (likely as approaching the end of the year waiting period), may ask neurosurgery for an opinion on future seizure risk, especially if he has remained seizure free up until that point.   A note was completed today for him (and rec'ed he talk with his supervisor and possibly HR about other work that may be available to help continue to work and won't involve driving the Agricultural engineerstreet sweeper).   2. Alcohol abuse- s/p attempted detox/rehab in [place] and he left rehab early and relapsed upon return  homeand againJuly 4th weekend. Has had [facility] involvedand [facility] involved(a more intensive outpatient program for alcohol dependence).By his report, he has been successful with alcohol abstinence to date (and he noted the fall was from a slip in the shower and alcohol not involved with alcohol level <10 on 7/29 when admitted)  returning to work was a helpful part of his treatment and management and I very much worry if now out of work for continued success. 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  The importance of remaining without any alcohol consumption at present was emphasized and he noted he would. Again emphasized the importanceof having [facility], support group thru them and [facility] remaininginvolved and calling today to get f/u appointments that were missed rescheduled and he stated he would do so. As prior, I noted again today that compliance with follow-ups will be needed to help with continuing his abstinence and being back at work.  2. Depression/anxiety- zoloft is helping and to continue. Heremainsnot actively suicidal at present and was very upbeat early during our visit,  and less so after told cannot return to work right after our appt as he was hoping and with the longer term limitations reviewed Emphasized having [facility] and Manuela Schwartz from [place] still involved as above and f/u appts to be made with him calling today as we discussed  Cont the zoloft.   Emphasized support services available and can always follow-up here this week if needed and he does have phone numbers to call prn if needed as well   3. Increased BPhx - BP today was good  Cont the metoprolol presently, and remain off the lisinopril and HCTZ   Cont to monitor   Will F/u with me again next Monday to re-assess, sooner prn Will likely want to check labs on f/u or shortly after to ensure remaining stable after discharge from hospital.

## 2019-02-16 ENCOUNTER — Telehealth: Payer: Self-pay

## 2019-02-16 ENCOUNTER — Other Ambulatory Visit: Payer: Self-pay | Admitting: Cardiovascular Disease

## 2019-02-16 MED ORDER — OMEPRAZOLE 20 MG PO CPDR: 20.0000 mg | DELAYED_RELEASE_CAPSULE | Freq: Every day | ORAL | 3 refills | Status: AC

## 2019-02-16 NOTE — Telephone Encounter (Signed)
Fax from [employer] will not cover BID dosing for omeprazole 20mg  bid  They will cover omeprazole 20mg  or 40mg  once a day  but will not cover bid dosing unless we have recent proof that pt has failed QD dosing    Not sure if you want to increase dose ot 40mg  once a day or leave at 20mg  and do once a day dosing at that strength

## 2019-02-16 NOTE — Telephone Encounter (Signed)
New rx sent

## 2019-02-16 NOTE — Telephone Encounter (Signed)
Please review for refill.  

## 2019-02-16 NOTE — Telephone Encounter (Signed)
Let's try to use 20mg  once daily.  If he needs a second dose as not controlled on once daily dosing, can then submit authorization that failed qd dosing and increase to bid.   It is hoped that the alcohol abstinence will lessen the GERD sx's as well.

## 2019-02-17 ENCOUNTER — Telehealth: Payer: Self-pay | Admitting: Cardiovascular Disease

## 2019-02-17 NOTE — Telephone Encounter (Signed)
-----   Message from Lamar Laundry, RN sent at 02/17/2019  4:12 PM EDT ----- Regarding: pt needs to be schedued for routine f/u Per Dr.Arida. Please call th pt to schedule a routine f/u appt.   Thanks

## 2019-02-17 NOTE — Telephone Encounter (Signed)
Patients 02/10/19 d/c summary instructions.  Subarachnoid hemorrhage Secondary to trauma in setting of aspirin and Brilinta use. Patient managed in the ICU initially on DDAVP and frequent neuro checks. Neurosurgery consulted and recommended no surgery intervention. BP meds held. Repeat CT head scans significant for stable hemorrhage. Recommendations per neurosurgery to restart home aspirin in 2 weeks and to discontinue Brilinta. Patient to be discharged with home health services.  Refill rqst refused. FYI update forwarded to Dr.Arida

## 2019-02-17 NOTE — Telephone Encounter (Signed)
Patient not feeling well .  Requests to call again

## 2019-02-17 NOTE — Telephone Encounter (Signed)
Discontinue Brilinta.  Do not refill.  The patient is due for routine follow-up visit.

## 2019-02-19 ENCOUNTER — Other Ambulatory Visit: Payer: Self-pay

## 2019-02-19 ENCOUNTER — Emergency Department (HOSPITAL_COMMUNITY): Payer: 59

## 2019-02-19 ENCOUNTER — Encounter (HOSPITAL_COMMUNITY): Payer: Self-pay | Admitting: Emergency Medicine

## 2019-02-19 ENCOUNTER — Emergency Department (HOSPITAL_COMMUNITY)
Admission: EM | Admit: 2019-02-19 | Discharge: 2019-02-20 | Disposition: A | Discharge status: Home / Self Care | Payer: 59 | Attending: Emergency Medicine | Admitting: Emergency Medicine

## 2019-02-19 DIAGNOSIS — Z955 Presence of coronary angioplasty implant and graft: Secondary | ICD-10-CM | POA: Diagnosis not present

## 2019-02-19 DIAGNOSIS — Z7982 Long term (current) use of aspirin: Secondary | ICD-10-CM | POA: Diagnosis not present

## 2019-02-19 DIAGNOSIS — W19XXXA Unspecified fall, initial encounter: Secondary | ICD-10-CM

## 2019-02-19 DIAGNOSIS — R404 Transient alteration of awareness: Secondary | ICD-10-CM | POA: Diagnosis not present

## 2019-02-19 DIAGNOSIS — Y999 Unspecified external cause status: Secondary | ICD-10-CM | POA: Insufficient documentation

## 2019-02-19 DIAGNOSIS — S2242XA Multiple fractures of ribs, left side, initial encounter for closed fracture: Secondary | ICD-10-CM

## 2019-02-19 DIAGNOSIS — R531 Weakness: Secondary | ICD-10-CM | POA: Diagnosis present

## 2019-02-19 DIAGNOSIS — S199XXA Unspecified injury of neck, initial encounter: Secondary | ICD-10-CM | POA: Diagnosis not present

## 2019-02-19 DIAGNOSIS — Z79899 Other long term (current) drug therapy: Secondary | ICD-10-CM | POA: Diagnosis not present

## 2019-02-19 DIAGNOSIS — I10 Essential (primary) hypertension: Secondary | ICD-10-CM | POA: Insufficient documentation

## 2019-02-19 DIAGNOSIS — Y92019 Unspecified place in single-family (private) house as the place of occurrence of the external cause: Secondary | ICD-10-CM | POA: Insufficient documentation

## 2019-02-19 DIAGNOSIS — R58 Hemorrhage, not elsewhere classified: Secondary | ICD-10-CM | POA: Diagnosis not present

## 2019-02-19 DIAGNOSIS — W010XXA Fall on same level from slipping, tripping and stumbling without subsequent striking against object, initial encounter: Secondary | ICD-10-CM | POA: Diagnosis not present

## 2019-02-19 DIAGNOSIS — Y9301 Activity, walking, marching and hiking: Secondary | ICD-10-CM | POA: Insufficient documentation

## 2019-02-19 DIAGNOSIS — R0689 Other abnormalities of breathing: Secondary | ICD-10-CM | POA: Diagnosis not present

## 2019-02-19 DIAGNOSIS — R51 Headache: Secondary | ICD-10-CM | POA: Insufficient documentation

## 2019-02-19 DIAGNOSIS — R69 Illness, unspecified: Secondary | ICD-10-CM | POA: Diagnosis not present

## 2019-02-19 DIAGNOSIS — S066X0A Traumatic subarachnoid hemorrhage without loss of consciousness, initial encounter: Secondary | ICD-10-CM | POA: Diagnosis not present

## 2019-02-19 DIAGNOSIS — R0902 Hypoxemia: Secondary | ICD-10-CM | POA: Diagnosis not present

## 2019-02-19 DIAGNOSIS — R52 Pain, unspecified: Secondary | ICD-10-CM | POA: Diagnosis not present

## 2019-02-19 DIAGNOSIS — F17210 Nicotine dependence, cigarettes, uncomplicated: Secondary | ICD-10-CM | POA: Insufficient documentation

## 2019-02-19 LAB — COMPREHENSIVE METABOLIC PANEL
ALT: 20 U/L (ref 0–44)
AST: 25 U/L (ref 15–41)
Albumin: 3.2 g/dL — ABNORMAL LOW (ref 3.5–5.0)
Alkaline Phosphatase: 74 U/L (ref 38–126)
Anion gap: 12 (ref 5–15)
BUN: 5 mg/dL — ABNORMAL LOW (ref 6–20)
CO2: 22 mmol/L (ref 22–32)
Calcium: 8.6 mg/dL — ABNORMAL LOW (ref 8.9–10.3)
Chloride: 106 mmol/L (ref 98–111)
Creatinine, Ser: 0.75 mg/dL (ref 0.61–1.24)
GFR calc Af Amer: >60 mL/min (ref >60)
GFR calc non Af Amer: >60 mL/min (ref >60)
Glucose, Bld: 88 mg/dL (ref 70–99)
Potassium: 3.7 mmol/L (ref 3.5–5.1)
Sodium: 140 mmol/L (ref 135–145)
Total Bilirubin: 0.5 mg/dL (ref 0.3–1.2)
Total Protein: 6.2 g/dL — ABNORMAL LOW (ref 6.5–8.1)

## 2019-02-19 LAB — CBC WITH DIFFERENTIAL/PLATELET
Abs Immature Granulocytes: 0.1 10*3/uL — ABNORMAL HIGH (ref 0.00–0.07)
Basophils Absolute: 0.1 10*3/uL (ref 0.0–0.1)
Basophils Relative: 1 %
Eosinophils Absolute: 0.1 10*3/uL (ref 0.0–0.5)
Eosinophils Relative: 1 %
HCT: 31.4 % — ABNORMAL LOW (ref 39.0–52.0)
Hemoglobin: 10.2 g/dL — ABNORMAL LOW (ref 13.0–17.0)
Immature Granulocytes: 1 %
Lymphocytes Relative: 23 %
Lymphs Abs: 2.3 10*3/uL (ref 0.7–4.0)
MCH: 33.3 pg (ref 26.0–34.0)
MCHC: 32.5 g/dL (ref 30.0–36.0)
MCV: 102.6 fL — ABNORMAL HIGH (ref 80.0–100.0)
Monocytes Absolute: 0.7 10*3/uL (ref 0.1–1.0)
Monocytes Relative: 7 %
Neutro Abs: 6.7 10*3/uL (ref 1.7–7.7)
Neutrophils Relative %: 67 %
Platelets: 540 10*3/uL — ABNORMAL HIGH (ref 150–400)
RBC: 3.06 MIL/uL — ABNORMAL LOW (ref 4.22–5.81)
RDW: 15.9 % — ABNORMAL HIGH (ref 11.5–15.5)
WBC: 10.1 10*3/uL (ref 4.0–10.5)
nRBC: 0 % (ref 0.0–0.2)

## 2019-02-19 LAB — URINALYSIS, ROUTINE W REFLEX MICROSCOPIC
Bilirubin Urine: NEGATIVE
Glucose, UA: NEGATIVE mg/dL
Hgb urine dipstick: NEGATIVE
Ketones, ur: NEGATIVE mg/dL
Leukocytes,Ua: NEGATIVE
Nitrite: NEGATIVE
Protein, ur: NEGATIVE mg/dL
Specific Gravity, Urine: 1.003 — ABNORMAL LOW (ref 1.005–1.030)
pH: 6 (ref 5.0–8.0)

## 2019-02-19 LAB — LACTIC ACID, PLASMA
Lactic Acid, Venous: 3 mmol/L (ref 0.5–1.9)
Lactic Acid, Venous: 2.3 mmol/L (ref 0.5–1.9)

## 2019-02-19 LAB — ETHANOL: Alcohol, Ethyl (B): 281 mg/dL — ABNORMAL HIGH (ref <10)

## 2019-02-19 LAB — RAPID URINE DRUG SCREEN, HOSP PERFORMED
Amphetamines: NOT DETECTED
Barbiturates: NOT DETECTED
Benzodiazepines: NOT DETECTED
Cocaine: NOT DETECTED
Opiates: NOT DETECTED
Tetrahydrocannabinol: NOT DETECTED

## 2019-02-19 LAB — LIPASE, BLOOD: Lipase: 28 U/L (ref 11–51)

## 2019-02-19 MED ORDER — ONDANSETRON HCL 4 MG/2ML IJ SOLN
4.0000 mg | Freq: Once | INTRAMUSCULAR | Status: AC
  Administered 2019-02-19: 4 mg via INTRAVENOUS
  Filled 2019-02-19: qty 2

## 2019-02-19 MED ORDER — SODIUM CHLORIDE 0.9 % IV BOLUS
1000.0000 mL | Freq: Once | INTRAVENOUS | Status: AC
  Administered 2019-02-19: 1000 mL via INTRAVENOUS

## 2019-02-19 NOTE — ED Notes (Signed)
Pt demanding food he will not listen to any explanation

## 2019-02-19 NOTE — ED Triage Notes (Signed)
Pt BIB by [facility], pt called and stated he wasn't feeling well. EMS found him stumbling around his home. Pt was discharged from [facility] on 02/10/2019 for subarachnoid hemorrhage. Pt reports having 3 beers today and keeps repeating questions, c/o HA on top of head. Unsteady gait. VSS. Hx of HTN, subarachnoid bleed, depression, high cholesterol, MI 2 years ago with stent placed.

## 2019-02-19 NOTE — ED Notes (Signed)
The pt still asking for food  Dr Stark Jock reports that the pt can eat food given

## 2019-02-19 NOTE — ED Provider Notes (Signed)
[facility] EMERGENCY DEPARTMENT Provider Note   CSN: [id number] Arrival date & time: 02/19/19  1640    History   Chief Complaint Chief Complaint  Patient presents with  . Weakness    HPI Dustin Velazquez is a 58 y.o. male with past medical history of recent subarachnoid bleed, MI with stent placed in 2018, hypertension, GERD, etoh abuse presents via EMS to emergency department today with chief complaint of not feeling well x 1 day.  Patient also reports he had unwitnessed fall yesterday and maybe hit his head after drinking multiple beers.  He denies loss of consciousness. He denies any falls today. He is unable to describe what "not feeling well" means. He does admit to nausea without emesis, and daily headaches although denies having one now. Level 5 caveat applies as pt is intoxicated.    Chart review shows patient [facility] getting out of the shower and hitting his head on the toilet seat. He was not a surgical candidate per neurosurgery consult.  Discharged home to restart aspirin in 2 weeks and discontinue Brilinta. Also discharged home with home health services.    Past Medical History:  Diagnosis Date  . ETOH abuse   . GERD (gastroesophageal reflux disease)   . Hypercholesteremia   . Hypertension   . MI (myocardial infarction) (HCC) 2018   1 stent placed  . Sleep walking disorder   . Subarachnoid bleed [facility](HCC)     Patient Active Problem List   Diagnosis Date Noted  . Orthostatic hypotension 02/10/2019  . Subarachnoid bleed (HCC)   . Hyponatremia 02/03/2019  . Depression 01/18/2019  . HTN (hypertension) 01/14/2019  . Alcohol withdrawal (HCC) 01/14/2019  . Anxiety state 01/14/2019  . MDD (major depressive disorder), recurrent episode, severe (HCC) 12/23/2018  . Severe recurrent major depression without psychotic features (HCC) 12/15/2018  . Suicidal behavior 12/14/2018  . Alcohol abuse  12/12/2018  . Non-STEMI (non-ST elevated myocardial infarction) (HCC) 12/29/2016    Past Surgical History:  Procedure Laterality Date  . APPENDECTOMY    . CORONARY STENT INTERVENTION N/A 12/30/2016   Procedure: Coronary Stent Intervention;  Surgeon: Iran OuchArida, Muhammad A, MD;  Location: [facility] INVASIVE CV LAB;  Service: Cardiovascular;  Laterality: N/A;  . KNEE SURGERY    . LEFT HEART CATH AND CORONARY ANGIOGRAPHY N/A 12/30/2016   Procedure: Left Heart Cath and Coronary Angiography;  Surgeon: Iran OuchArida, Muhammad A, MD;  Location: [facility] INVASIVE CV LAB;  Service: Cardiovascular;  Laterality: N/A;        Home Medications    Prior to Admission medications   Medication Sig Start Date End Date Taking? Authorizing Provider  acetaminophen (TYLENOL) 500 MG tablet Take 1,000 mg by mouth at bedtime as needed for headache.   Yes [provider]  hydrochlorothiazide (HYDRODIURIL) 25 MG tablet Take 25 mg by mouth every morning.   Yes [provider]  lisinopril (ZESTRIL) 40 MG tablet Take 40 mg by mouth every morning. 02/10/19  Yes [provider]  metoprolol tartrate (LOPRESSOR) 50 MG tablet Take 0.5 tablets (25 mg total) by mouth 2 (two) times daily. 02/10/19  Yes Narda BondsNettey, Ralph A, MD  omeprazole (PRILOSEC) 20 MG capsule Take 1 capsule (20 mg total) by mouth daily. Patient taking differently: Take 20 mg by mouth every morning.  02/16/19 05/17/19 Yes Jamelle HaringHendrickson, Clifford D, MD  rosuvastatin (CRESTOR) 40 MG tablet Take 1 tablet (40 mg total) by mouth every evening. 02/17/19  Yes  Iran OuchArida, Muhammad A, MD  sertraline (ZOLOFT) 50 MG tablet Take 50 mg by mouth every morning.  12/21/18  Yes [provider]  aspirin EC 81 MG tablet Take 1 tablet (81 mg total) by mouth daily. Restart on 02/24/2019 02/24/19   Narda BondsNettey, Ralph A, MD    Family History Family History  Problem Relation Age of Onset  . Hypertension Mother   . Hyperlipidemia Father     Social History Social History   Tobacco  Use  . Smoking status: Current Every Day Smoker    Packs/day: 0.25    Years: 30.00    Pack years: 7.50    Types: Cigarettes  . Smokeless tobacco: Never Used  Substance Use Topics  . Alcohol use: Yes    Alcohol/week: 11.0 standard drinks    Types: 8 Cans of beer, 3 Shots of liquor per week    Comment: 1/2 pint tequila a day, pt states that he is trying to quit   . Drug use: No     Allergies   Compazine [prochlorperazine edisylate]   Review of Systems Review of Systems  Constitutional: Negative for chills and fever.  HENT: Negative for congestion, rhinorrhea, sinus pressure and sore throat.   Eyes: Negative for pain and redness.  Respiratory: Negative for cough, shortness of breath and wheezing.   Cardiovascular: Negative for chest pain and palpitations.  Gastrointestinal: Positive for nausea. Negative for abdominal pain, constipation, diarrhea and vomiting.  Genitourinary: Negative for dysuria.  Musculoskeletal: Negative for arthralgias, back pain, myalgias and neck pain.  Skin: Negative for rash and wound.  Neurological: Positive for weakness and headaches. Negative for dizziness, syncope and numbness.  Psychiatric/Behavioral: Negative for confusion.     Physical Exam Updated Vital Signs BP 108/72   Pulse 74   Temp 98.2 F (36.8 C) (Oral)   Resp (!) 25   Ht 5\' 9"  (1.753 m)   Wt 79.4 kg   SpO2 98%   BMI 25.84 kg/m   Physical Exam Vitals signs and nursing note reviewed.  Constitutional:      General: He is not in acute distress.    Appearance: He is not ill-appearing.     Comments: Pt appears intoxicated. He is making inappropriate comments towards staff. He is repeating himself during interview.  HENT:     Head: Normocephalic and atraumatic. No raccoon eyes or Battle's sign.     Jaw: There is normal jaw occlusion.     Comments: No tenderness to palpation of skull. No deformities or crepitus noted. No open wounds, abrasions or lacerations.    Right Ear:  Tympanic membrane and external ear normal.     Left Ear: Tympanic membrane and external ear normal.     Nose: Nose normal. No nasal tenderness.     Mouth/Throat:     Mouth: Mucous membranes are dry.     Pharynx: Oropharynx is clear.  Eyes:     General: No scleral icterus.       Right eye: No discharge.        Left eye: No discharge.     Extraocular Movements: Extraocular movements intact.     Conjunctiva/sclera: Conjunctivae normal.     Pupils: Pupils are equal, round, and reactive to light.  Neck:     Musculoskeletal: Normal range of motion.     Vascular: No JVD.     Comments: Full ROM intact without spinous process TTP. No bony stepoffs or deformities, no paraspinous muscle TTP or muscle spasms. No rigidity or  meningeal signs. No bruising, erythema, or swelling. Cardiovascular:     Rate and Rhythm: Normal rate and regular rhythm.     Pulses: Normal pulses.          Radial pulses are 2+ on the right side and 2+ on the left side.     Heart sounds: Normal heart sounds.  Pulmonary:     Comments: Lungs clear to auscultation in all fields. Symmetric chest rise. No wheezing, rales, or rhonchi. No respiratory distress Chest:    Abdominal:     Comments: Abdomen is soft, non-distended, and non-tender in all quadrants. No rigidity, no guarding. No peritoneal signs.  Musculoskeletal: Normal range of motion.     Comments: Full range of motion of the T-spine and L-spine No tenderness to palpation of the spinous processes of the T-spine or L-spine No crepitus, deformity or step-offs No tenderness to palpation of the paraspinous muscles of the L-spine   Skin:    General: Skin is warm and dry.     Capillary Refill: Capillary refill takes less than 2 seconds.  Neurological:     Mental Status: He is oriented to person, place, and time.     GCS: GCS eye subscore is 4. GCS verbal subscore is 5. GCS motor subscore is 6.     Comments: Fluent speech, no facial droop. Pt alert to self and location.  He does report year is 2013.  Mental Status:  Speech fluent without evidence of aphasia. Able to follow 2 step commands without difficulty.  Cranial Nerves:  II:  Peripheral visual fields grossly normal, pupils equal, round, reactive to light III,IV, VI: ptosis not present, extra-ocular motions intact bilaterally  V,VII: smile symmetric, facial light touch sensation equal VIII: hearing grossly normal to voice  X: uvula elevates symmetrically  XI: bilateral shoulder shrug symmetric and strong XII: midline tongue extension without fassiculations Motor:  Normal tone. 5/5 in upper and lower extremities bilaterally including strong and equal grip strength and dorsiflexion/plantar flexion Sensory: Pinprick and light touch normal in all extremities.  Deep Tendon Reflexes: 2+ and symmetric in the biceps and patella Cerebellar: normal finger-to-nose with bilateral upper extremities CV: distal pulses palpable throughout     Psychiatric:        Behavior: Behavior is hyperactive. Behavior is cooperative.        Judgment: Judgment is impulsive.      ED Treatments / Results  Labs (all labs ordered are listed, but only abnormal results are displayed) Labs Reviewed  CBC WITH DIFFERENTIAL/PLATELET - Abnormal; Notable for the following components:      Result Value   RBC 3.06 (*)    Hemoglobin 10.2 (*)    HCT 31.4 (*)    MCV 102.6 (*)    RDW 15.9 (*)    Platelets 540 (*)    Abs Immature Granulocytes 0.10 (*)    All other components within normal limits  COMPREHENSIVE METABOLIC PANEL - Abnormal; Notable for the following components:   BUN 5 (*)    Calcium 8.6 (*)    Total Protein 6.2 (*)    Albumin 3.2 (*)    All other components within normal limits  ETHANOL - Abnormal; Notable for the following components:   Alcohol, Ethyl (B) 281 (*)    All other components within normal limits  URINALYSIS, ROUTINE W REFLEX MICROSCOPIC - Abnormal; Notable for the following components:   Color,  Urine STRAW (*)    Specific Gravity, Urine 1.003 (*)    All other  components within normal limits  LACTIC ACID, PLASMA - Abnormal; Notable for the following components:   Lactic Acid, Venous 3.0 (*)    All other components within normal limits  LACTIC ACID, PLASMA - Abnormal; Notable for the following components:   Lactic Acid, Venous 2.3 (*)    All other components within normal limits  CULTURE, BLOOD (ROUTINE X 2)  CULTURE, BLOOD (ROUTINE X 2)  LIPASE, BLOOD  RAPID URINE DRUG SCREEN, HOSP PERFORMED    EKG EKG Interpretation  Date/Time:  Friday February 19 2019 16:49:49 EDT Ventricular Rate:  79 PR Interval:    QRS Duration: 99 QT Interval:  397 QTC Calculation: 456 R Axis:   72 Text Interpretation:  Sinus rhythm Normal ECG Confirmed by Geoffery Lyons ([id number]) on 02/19/2019 5:00:08 PM   Radiology Dg Ribs Unilateral W/chest Left  Result Date: 02/19/2019 CLINICAL DATA:  Shortness of breath and lower left rib pain since fall 2 weeks ago. EXAM: LEFT RIBS AND CHEST - 3+ VIEW COMPARISON:  Chest x-ray dated February 06, 2019. FINDINGS: Acute displaced fractures of the left lateral ninth through eleventh ribs. The heart size and mediastinal contours are within normal limits. Atherosclerotic calcification of the aortic arch. Normal pulmonary vascularity. No focal consolidation, pleural effusion, or pneumothorax. IMPRESSION: 1. Acute displaced fractures of the left lateral ninth through eleventh ribs. No pneumothorax. Electronically Signed   By: Obie Dredge M.D.   On: 02/19/2019 19:32   Ct Head Wo Contrast  Result Date: 02/19/2019 CLINICAL DATA:  Head trauma from a fall yesterday. Recent subarachnoid hemorrhage. EXAM: CT HEAD WITHOUT CONTRAST CT CERVICAL SPINE WITHOUT CONTRAST TECHNIQUE: Multidetector CT imaging of the head and cervical spine was performed following the standard protocol without intravenous contrast. Multiplanar CT image reconstructions of the cervical spine were also  generated. COMPARISON:  02/04/2019 and 02/03/2019 FINDINGS: CT HEAD FINDINGS Brain: The previously demonstrated subarachnoid hemorrhage has resolved. Stable small, old right superior and thalamic lacunar infarct. Stable mildly enlarged ventricles and cortical sulci. No intracranial hemorrhage, mass lesion or CT evidence of acute infarction. Vascular: No hyperdense vessel or unexpected calcification. Skull: Normal. Negative for fracture or focal lesion. Sinuses/Orbits: Stable left mastoid air cell opacification. Unremarkable orbits and paranasal sinuses. Other: None. CT CERVICAL SPINE FINDINGS Alignment: Straightening of the normal cervical lordosis with slight improvement. No subluxations. Skull base and vertebrae: No acute fracture. No primary bone lesion or focal pathologic process. Soft tissues and spinal canal: No prevertebral fluid or swelling. No visible canal hematoma. Disc levels:  Stable multilevel degenerative changes. Upper chest: Clear lung apices. Other: Mild bilateral carotid artery atheromatous calcification. IMPRESSION: 1. Resolved subarachnoid hemorrhage. 2. No skull fracture or acute intracranial hemorrhage. 3. No cervical spine fracture or subluxation. 4. Stable mild diffuse cerebral and cerebellar atrophy and old right superior thalamic lacunar infarct. 5. Stable cervical spine degenerative changes with straightening of the normal cervical lordosis. 6. Mild bilateral atheromatous carotid artery calcification. Electronically Signed   By: Beckie Salts M.D.   On: 02/19/2019 22:49   Ct Cervical Spine Wo Contrast  Result Date: 02/19/2019 CLINICAL DATA:  Head trauma from a fall yesterday. Recent subarachnoid hemorrhage. EXAM: CT HEAD WITHOUT CONTRAST CT CERVICAL SPINE WITHOUT CONTRAST TECHNIQUE: Multidetector CT imaging of the head and cervical spine was performed following the standard protocol without intravenous contrast. Multiplanar CT image reconstructions of the cervical spine were also  generated. COMPARISON:  02/04/2019 and 02/03/2019 FINDINGS: CT HEAD FINDINGS Brain: The previously demonstrated subarachnoid hemorrhage has resolved. Stable small, old right  superior and thalamic lacunar infarct. Stable mildly enlarged ventricles and cortical sulci. No intracranial hemorrhage, mass lesion or CT evidence of acute infarction. Vascular: No hyperdense vessel or unexpected calcification. Skull: Normal. Negative for fracture or focal lesion. Sinuses/Orbits: Stable left mastoid air cell opacification. Unremarkable orbits and paranasal sinuses. Other: None. CT CERVICAL SPINE FINDINGS Alignment: Straightening of the normal cervical lordosis with slight improvement. No subluxations. Skull base and vertebrae: No acute fracture. No primary bone lesion or focal pathologic process. Soft tissues and spinal canal: No prevertebral fluid or swelling. No visible canal hematoma. Disc levels:  Stable multilevel degenerative changes. Upper chest: Clear lung apices. Other: Mild bilateral carotid artery atheromatous calcification. IMPRESSION: 1. Resolved subarachnoid hemorrhage. 2. No skull fracture or acute intracranial hemorrhage. 3. No cervical spine fracture or subluxation. 4. Stable mild diffuse cerebral and cerebellar atrophy and old right superior thalamic lacunar infarct. 5. Stable cervical spine degenerative changes with straightening of the normal cervical lordosis. 6. Mild bilateral atheromatous carotid artery calcification. Electronically Signed   By: Claudie Revering M.D.   On: 02/19/2019 22:49    Procedures Procedures (including critical care time)  Medications Ordered in ED Medications  ondansetron (ZOFRAN) injection 4 mg (4 mg Intravenous Given 02/19/19 1748)  sodium chloride 0.9 % bolus 1,000 mL (0 mLs Intravenous Stopped 02/19/19 2130)  ondansetron (ZOFRAN) injection 4 mg (4 mg Intravenous Given 02/19/19 2152)     Initial Impression / Assessment and Plan / ED Course  I have reviewed the triage  vital signs and the nursing notes.  Pertinent labs & imaging results that were available during my care of the patient were reviewed by me and considered in my medical decision making (see chart for details).  58 yo male presents with chief complaint of not feeling well. He is non toxic in appearance, no acute distress.  He is afebrile.  He is unable to provide much history but does admit to drinking alcohol daily. No bruising noted to his face, head is non tender. No physical exam findings to suggest head trauma. He does have tenderness over left chest. Lungs clear to auscultation in all fields and Spo2 is 99% on room air. No evidence of flail chest on exam. Basic blood work obtained, will CT head, and xray chest. Pt declines pain medication for rib pain.  Labs are significant for ethanol of 281. Lactic acidosis of 3.0. IVF started. Chest xray shows left rib fractures 9-11, no pneumothorax. Head CT shows resolved SAH, no acute findings today, no signs of bleeding. On reassessment pt reports feeling much better. No hypoxia or increased work of breathing. Repeat lactic acid is trending down. Pt appears clinically sober. He ambulates with steady gait, speech is clear and goal oriented. He is requesting to be discharged home since he feels better. He reports rib pain is controlled without pain medication and he does not need prescription for any. Recommend tylenol for pain as needed.  Patient is hemodynamically stable, in NAD, and able to ambulate in the ED. Evaluation does not show pathology that would require ongoing emergent intervention or inpatient treatment. I explained the diagnosis to the patient. Patient is comfortable with above plan and is stable for discharge at this time. All questions were answered prior to disposition. Strict return precautions for returning to the ED were discussed. Encouraged follow up with PCP for follow up of rib fractures. Findings and plan of care discussed with supervising  physician Dr. Stark Jock who agrees with treatment plan.  This note was  prepared using Conservation officer, [employer] voice recognition software and may include unintentional dictation errors due to the inherent limitations of voice recognition software.   Final Clinical Impressions(s) / ED Diagnoses   Final diagnoses:  Fall, initial encounter    ED Discharge Orders    None       Kathyrn Lasslbrizze,  E, PA-C 02/20/19 [id number]    Geoffery Lyonselo, [place], MD 02/22/19 0900

## 2019-02-19 NOTE — ED Notes (Signed)
Patient transported to X-ray 

## 2019-02-19 NOTE — Discharge Instructions (Addendum)
You have been seen today for not feeling well. Please read and follow all provided instructions. Return to the emergency room for worsening condition or new concerning symptoms.    Your chest x-ray today shows that you have broken ribs on the left.  You can take Tylenol as needed for pain.  1. Medications: Continue usual home medications Take medications as prescribed. Please review all of the medicines and only take them if you do not have an allergy to them.   2. Treatment: rest, drink plenty of fluids  3. Follow Up: Please follow up with your primary doctor in 2-5 days for discussion of your diagnoses and further evaluation after today's visit; Call today to arrange your follow up.    ?

## 2019-02-19 NOTE — ED Notes (Signed)
Pt asking for food 

## 2019-02-20 NOTE — ED Notes (Signed)
Pt left room beofre nurse could get back with discharge papers which he left

## 2019-02-21 ENCOUNTER — Emergency Department
Admission: EM | Admit: 2019-02-21 | Discharge: 2019-02-21 | Disposition: A | Discharge status: Home / Self Care | Payer: 59 | Attending: Emergency Medicine | Admitting: Emergency Medicine

## 2019-02-21 ENCOUNTER — Emergency Department
Admission: EM | Admit: 2019-02-21 | Discharge: 2019-02-22 | Disposition: A | Discharge status: Other Acute Inpatient Hospital | Payer: 59 | Source: Home / Self Care | Attending: Emergency Medicine | Admitting: Emergency Medicine

## 2019-02-21 ENCOUNTER — Encounter: Payer: Self-pay | Admitting: Emergency Medicine

## 2019-02-21 ENCOUNTER — Encounter: Payer: Self-pay | Admitting: Intensive Care

## 2019-02-21 ENCOUNTER — Other Ambulatory Visit: Payer: Self-pay

## 2019-02-21 ENCOUNTER — Emergency Department: Payer: 59

## 2019-02-21 DIAGNOSIS — Z79899 Other long term (current) drug therapy: Secondary | ICD-10-CM | POA: Diagnosis not present

## 2019-02-21 DIAGNOSIS — I252 Old myocardial infarction: Secondary | ICD-10-CM | POA: Diagnosis not present

## 2019-02-21 DIAGNOSIS — I10 Essential (primary) hypertension: Secondary | ICD-10-CM | POA: Diagnosis not present

## 2019-02-21 DIAGNOSIS — R079 Chest pain, unspecified: Secondary | ICD-10-CM | POA: Diagnosis not present

## 2019-02-21 DIAGNOSIS — Y907 Blood alcohol level of 200-239 mg/100 ml: Secondary | ICD-10-CM | POA: Insufficient documentation

## 2019-02-21 DIAGNOSIS — F10220 Alcohol dependence with intoxication, uncomplicated: Secondary | ICD-10-CM | POA: Diagnosis not present

## 2019-02-21 DIAGNOSIS — Z955 Presence of coronary angioplasty implant and graft: Secondary | ICD-10-CM | POA: Insufficient documentation

## 2019-02-21 DIAGNOSIS — R69 Illness, unspecified: Secondary | ICD-10-CM | POA: Diagnosis not present

## 2019-02-21 DIAGNOSIS — F17210 Nicotine dependence, cigarettes, uncomplicated: Secondary | ICD-10-CM | POA: Insufficient documentation

## 2019-02-21 DIAGNOSIS — R000 Tachycardia, unspecified: Secondary | ICD-10-CM | POA: Diagnosis not present

## 2019-02-21 DIAGNOSIS — R0902 Hypoxemia: Secondary | ICD-10-CM | POA: Diagnosis not present

## 2019-02-21 DIAGNOSIS — F10920 Alcohol use, unspecified with intoxication, uncomplicated: Secondary | ICD-10-CM | POA: Diagnosis present

## 2019-02-21 DIAGNOSIS — F1010 Alcohol abuse, uncomplicated: Secondary | ICD-10-CM

## 2019-02-21 HISTORY — DX: Depression, unspecified: F32.A

## 2019-02-21 LAB — COMPREHENSIVE METABOLIC PANEL
ALT: 22 U/L (ref 0–44)
AST: 34 U/L (ref 15–41)
Albumin: 3.6 g/dL (ref 3.5–5.0)
Alkaline Phosphatase: 86 U/L (ref 38–126)
Anion gap: 14 (ref 5–15)
BUN: 10 mg/dL (ref 6–20)
CO2: 19 mmol/L — ABNORMAL LOW (ref 22–32)
Calcium: 8.8 mg/dL — ABNORMAL LOW (ref 8.9–10.3)
Chloride: 107 mmol/L (ref 98–111)
Creatinine, Ser: 0.74 mg/dL (ref 0.61–1.24)
GFR calc Af Amer: >60 mL/min (ref >60)
GFR calc non Af Amer: >60 mL/min (ref >60)
Glucose, Bld: 108 mg/dL — ABNORMAL HIGH (ref 70–99)
Potassium: 3.7 mmol/L (ref 3.5–5.1)
Sodium: 140 mmol/L (ref 135–145)
Total Bilirubin: 0.8 mg/dL (ref 0.3–1.2)
Total Protein: 6.9 g/dL (ref 6.5–8.1)

## 2019-02-21 LAB — BASIC METABOLIC PANEL
Anion gap: 18 — ABNORMAL HIGH (ref 5–15)
BUN: 8 mg/dL (ref 6–20)
CO2: 22 mmol/L (ref 22–32)
Calcium: 8.3 mg/dL — ABNORMAL LOW (ref 8.9–10.3)
Chloride: 90 mmol/L — ABNORMAL LOW (ref 98–111)
Creatinine, Ser: 0.57 mg/dL — ABNORMAL LOW (ref 0.61–1.24)
GFR calc Af Amer: >60 mL/min (ref >60)
GFR calc non Af Amer: >60 mL/min (ref >60)
Glucose, Bld: 70 mg/dL (ref 70–99)
Potassium: 3.2 mmol/L — ABNORMAL LOW (ref 3.5–5.1)
Sodium: 130 mmol/L — ABNORMAL LOW (ref 135–145)

## 2019-02-21 LAB — CBC WITH DIFFERENTIAL/PLATELET
Abs Immature Granulocytes: 0.1 10*3/uL — ABNORMAL HIGH (ref 0.00–0.07)
Basophils Absolute: 0.1 10*3/uL (ref 0.0–0.1)
Basophils Relative: 1 %
Eosinophils Absolute: 0.1 10*3/uL (ref 0.0–0.5)
Eosinophils Relative: 1 %
HCT: 33.5 % — ABNORMAL LOW (ref 39.0–52.0)
Hemoglobin: 10.6 g/dL — ABNORMAL LOW (ref 13.0–17.0)
Immature Granulocytes: 1 %
Lymphocytes Relative: 15 %
Lymphs Abs: 1.2 10*3/uL (ref 0.7–4.0)
MCH: 32.6 pg (ref 26.0–34.0)
MCHC: 31.6 g/dL (ref 30.0–36.0)
MCV: 103.1 fL — ABNORMAL HIGH (ref 80.0–100.0)
Monocytes Absolute: 0.5 10*3/uL (ref 0.1–1.0)
Monocytes Relative: 7 %
Neutro Abs: 6.1 10*3/uL (ref 1.7–7.7)
Neutrophils Relative %: 75 %
Platelets: 506 10*3/uL — ABNORMAL HIGH (ref 150–400)
RBC: 3.25 MIL/uL — ABNORMAL LOW (ref 4.22–5.81)
RDW: 16.6 % — ABNORMAL HIGH (ref 11.5–15.5)
WBC: 8 10*3/uL (ref 4.0–10.5)
nRBC: 0 % (ref 0.0–0.2)

## 2019-02-21 LAB — TROPONIN I (HIGH SENSITIVITY): Troponin I (High Sensitivity): 8 ng/L (ref <18)

## 2019-02-21 LAB — LIPASE, BLOOD: Lipase: 29 U/L (ref 11–51)

## 2019-02-21 LAB — ACETAMINOPHEN LEVEL: Acetaminophen (Tylenol), Serum: 17 ug/mL (ref 10–30)

## 2019-02-21 LAB — CBC
HCT: 29.1 % — ABNORMAL LOW (ref 39.0–52.0)
Hemoglobin: 9.5 g/dL — ABNORMAL LOW (ref 13.0–17.0)
MCH: 33.2 pg (ref 26.0–34.0)
MCHC: 32.6 g/dL (ref 30.0–36.0)
MCV: 101.7 fL — ABNORMAL HIGH (ref 80.0–100.0)
Platelets: 453 10*3/uL — ABNORMAL HIGH (ref 150–400)
RBC: 2.86 MIL/uL — ABNORMAL LOW (ref 4.22–5.81)
RDW: 16 % — ABNORMAL HIGH (ref 11.5–15.5)
WBC: 10.1 10*3/uL (ref 4.0–10.5)
nRBC: 0 % (ref 0.0–0.2)

## 2019-02-21 LAB — ETHANOL: Alcohol, Ethyl (B): 222 mg/dL — ABNORMAL HIGH (ref <10)

## 2019-02-21 MED ORDER — SODIUM CHLORIDE 0.9% FLUSH: 3.0000 mL | Freq: Once | INTRAVENOUS | Status: DC

## 2019-02-21 NOTE — ED Notes (Signed)
Pt given water by Mickel Baas, EDT

## 2019-02-21 NOTE — BH Assessment (Signed)
Assessment Note  Dustin Velazquez is an 58 y.o. male who presents to the ER seeking assistance for detox for his alcohol use. Patient reports of drinking on a daily basis, in the amount of "seven beers and some tequila." He told this Clinical research associate he didn't know now much tequila he drank but told other ER staff he drinks a pint a day. He states his symptoms of withdrawal are; shakes, some dizziness, nausea  and vomiting. Upon arrival to the ER, his BAC was 222.  During the interview, patient was cooperative and able to provide appropriate answers to the questions. He reports of receiving inpatient treatment in the past and wanting that level of care again.  Patient denies SI/HI and A/V. He also denies the use of any other mind-altering substance. He denies any involvement with the legal system.   Diagnosis: Alcohol Use Disorder  Past Medical History:  Past Medical History:  Diagnosis Date  . Depression   . ETOH abuse   . GERD (gastroesophageal reflux disease)   . Hypercholesteremia   . Hypertension   . MI (myocardial infarction) (HCC) 2018   1 stent placed  . Sleep walking disorder   . Subarachnoid bleed [facility])     Past Surgical History:  Procedure Laterality Date  . APPENDECTOMY    . CORONARY STENT INTERVENTION N/A 12/30/2016   Procedure: Coronary Stent Intervention;  Surgeon: Iran Ouch, MD;  Location: [facility] INVASIVE CV LAB;  Service: Cardiovascular;  Laterality: N/A;  . KNEE SURGERY    . LEFT HEART CATH AND CORONARY ANGIOGRAPHY N/A 12/30/2016   Procedure: Left Heart Cath and Coronary Angiography;  Surgeon: Iran Ouch, MD;  Location: [facility] INVASIVE CV LAB;  Service: Cardiovascular;  Laterality: N/A;    Family History:  Family History  Problem Relation Age of Onset  . Hypertension Mother   . Hyperlipidemia Father     Social History:  reports that he has been smoking cigarettes. He has a 7.50 pack-year smoking history. He has never used smokeless tobacco. He reports current  alcohol use of about 11.0 standard drinks of alcohol per week. He reports that he does not use drugs.  Additional Social History:  Alcohol / Drug Use Pain Medications: See PTA Prescriptions: See PTA Over the Counter: See PTA History of alcohol / drug use?: Yes Substance #1 Name of Substance 1: Alcohol 1 - Amount (size/oz): "Seven beers and some tequila" 1 - Frequency: Daily 1 - Duration: Unable to quantify 1 - Last Use / Amount: 02/21/2019  CIWA: CIWA-Ar BP: (!) 115/54 Pulse Rate: (!) 120 COWS:    Allergies:  Allergies  Allergen Reactions  . Compazine [Prochlorperazine Edisylate] Other (See Comments)    Seizures     Home Medications: (Not in a hospital admission)   OB/GYN Status:  No LMP for male patient.  General Assessment Data Location of Assessment: [facility] ED TTS Assessment: In system Is this a Tele or Face-to-Face Assessment?: Face-to-Face Is this an Initial Assessment or a Re-assessment for this encounter?: Initial Assessment Patient Accompanied by:: N/A Language Other than English: No Living Arrangements: Other (Comment)(Private Home) What gender do you identify as?: Male Marital status: Separated Pregnancy Status: No Living Arrangements: Alone Can pt return to current living arrangement?: Yes Admission Status: Voluntary Is patient capable of signing voluntary admission?: Yes Referral Source: Self/Family/Friend Insurance type: Designer, industrial/product Exam [facility] Walk-in ONLY) Medical Exam completed: Yes  Crisis Care Plan Living Arrangements: Alone Legal Guardian: Other:(Self) Name of Psychiatrist: Reports of none  Name of Therapist: [facility]  Education Status Is patient currently in school?: No Highest grade of school patient has completed: High School Diploma Is the patient employed, unemployed or receiving disability?: Employed  Risk to self with the past 6 months Suicidal Ideation: No Has patient been a risk to self within the  past 6 months prior to admission? : No Suicidal Intent: No Has patient had any suicidal intent within the past 6 months prior to admission? : No Is patient at risk for suicide?: No Suicidal Plan?: No Has patient had any suicidal plan within the past 6 months prior to admission? : No Access to Means: No Specify Access to Suicidal Means: Reports of none What has been your use of drugs/alcohol within the last 12 months?: Alcohol Previous Attempts/Gestures: No How many times?: 0 Other Self Harm Risks: Active alcohol abuse Triggers for Past Attempts: None known Intentional Self Injurious Behavior: None Family Suicide History: No Recent stressful life event(s): Other (Comment)(Alcohol Abuse) Persecutory voices/beliefs?: No Depression: Yes Depression Symptoms: Guilt, Feeling worthless/self pity Substance abuse history and/or treatment for substance abuse?: Yes Suicide prevention information given to non-admitted patients: Not applicable  Risk to Others within the past 6 months Homicidal Ideation: No Does patient have any lifetime risk of violence toward others beyond the six months prior to admission? : No Thoughts of Harm to Others: No Current Homicidal Intent: No Current Homicidal Plan: No Access to Homicidal Means: No Identified Victim: Reports of none History of harm to others?: No Assessment of Violence: None Noted Violent Behavior Description: Reports of none Does patient have access to weapons?: No Criminal Charges Pending?: No Does patient have a court date: No Is patient on probation?: No  Psychosis Hallucinations: None noted Delusions: None noted  Mental Status Report Appearance/Hygiene: Unremarkable Eye Contact: Fair Motor Activity: Unable to assess, Restlessness(Patient sitting on bed) Speech: Slurred, Pressured, Loud Level of Consciousness: Alert, Restless, Irritable Mood: Anxious, Ambivalent Affect: Anxious, Appropriate to circumstance Anxiety Level:  Moderate Thought Processes: Coherent, Relevant Judgement: Partial Orientation: Person, Place, Time, Situation, Appropriate for developmental age Obsessive Compulsive Thoughts/Behaviors: None  Cognitive Functioning Concentration: Normal Memory: Recent Intact, Remote Intact Is patient IDD: No Insight: Fair Impulse Control: Fair Appetite: Good Have you had any weight changes? : No Change Sleep: No Change Total Hours of Sleep: 8 Vegetative Symptoms: None  ADLScreening [facility] Assessment Services) Patient's cognitive ability adequate to safely complete daily activities?: Yes Patient able to express need for assistance with ADLs?: Yes Independently performs ADLs?: Yes (appropriate for developmental age)  Prior Inpatient Therapy Prior Inpatient Therapy: Yes Prior Therapy Dates: 12/2018(2x)  [facility] [place] Reason for Treatment: Alcohol Detox and Substance Abuse Treatment  Prior Outpatient Therapy Prior Outpatient Therapy: Yes Prior Therapy Dates: Current Prior Therapy Facilty/Provider(s[facility] Reason for Treatment: Alcohol Abuse Disorder Does patient have an ACCT team?: No Does patient have Intensive In-House Services?  : No Does patient have [facility] services? : No Does patient have P4CC services?: No  ADL Screening (condition at time of admission) Patient's cognitive ability adequate to safely complete daily activities?: Yes Is the patient deaf or have difficulty hearing?: No Does the patient have difficulty seeing, even when wearing glasses/contacts?: No Does the patient have difficulty concentrating, remembering, or making decisions?: No Patient able to express need for assistance with ADLs?: Yes Does the patient have difficulty dressing or bathing?: No Independently performs ADLs?: Yes (appropriate for developmental age) Does  the patient have difficulty walking or climbing stairs?:  No Weakness of Legs: None Weakness of Arms/Hands: None  Home Assistive Devices/Equipment Home Assistive Devices/Equipment: None  Therapy Consults (therapy consults require a physician order) PT Evaluation Needed: No OT Evalulation Needed: No SLP Evaluation Needed: No Abuse/Neglect Assessment (Assessment to be complete while patient is alone) Abuse/Neglect Assessment Can Be Completed: Yes Verbal Abuse: Denies Sexual Abuse: Denies Exploitation of patient/patient's resources: Denies Self-Neglect: Denies Values / Beliefs Cultural Requests During Hospitalization: None Spiritual Requests During Hospitalization: None Consults Spiritual Care Consult Needed: No Social Work Consult Needed: No Regulatory affairs officer (For Healthcare) Does Patient Have a Medical Advance Directive?: No Would patient like information on creating a medical advance directive?: No - Patient declined       Child/Adolescent Assessment Running Away Risk: Denies(Patient is an adult)  Disposition:  Disposition Initial Assessment Completed for this Encounter: Yes  On Site Evaluation by:   Reviewed with Physician:    Gunnar Fusi MS, [place], [facility], [place], [place] Therapeutic Triage Specialist 02/21/2019 3:58 PM

## 2019-02-21 NOTE — ED Notes (Signed)
Pt sitting on side of bed in 20 hallway and keeps stating loudly how "dizzy" he is.  Pt advised to stay in the bed and advised of fall risk.  Pt then stands to walk around.  Pt was advised again to get in bed and stay there.  States his side hurts and he went to [place] last night and was told he has fractured ribs.  Patient was told again to stay in bed and to relax.  Continues to state loudly how dizzy he is.  Pt very restless.

## 2019-02-21 NOTE — ED Triage Notes (Signed)
EMS pt to rm 5 from home with report of chest pain since 9am. Here earlier and left AMA. (nOTE FROM EARLIER STATED HERE FOR DETOX.) Pt reports mid chest pain radiating down his left arm. + n/V and shortness of breath. Pt had 7 12 oz beers today.

## 2019-02-21 NOTE — ED Notes (Signed)
Pt visualized by this RN getting out of wheelchair and sitting in the floor. Pt informed by this RN that he needed to get back into his wheelchair. Pt continued to lay in the floor. Pt informed by [employer] officer Chana Bode that he needed to get back in his wheelchair and out of the floor. Pt stated that he could not get out of the floor. Pt was assisted by this RN and [employer] back into this wheelchair.

## 2019-02-21 NOTE — ED Notes (Signed)
Pt in lobby yelling out at other patients who are walking into the ED. Pt reminded that this is not appropriate behavior.   Charge RN called to see if there was a bed available

## 2019-02-21 NOTE — ED Provider Notes (Signed)
[facility] Emergency Department Provider Note  ____________________________________________   I have reviewed the triage vital signs and the nursing notes.   HISTORY  Chief Complaint Chest Pain and Drug / Alcohol Assessment   History limited by: Not Limited   HPI Dustin Velazquez is a 58 y.o. male who presents to the emergency department today because of concern for chest pain. The patient was seen in the emergency department earlier to today for detox concerns. He says that shortly after arriving home from the emergency department he started developing left sided chest pain. Was intermittent throughout the day. Describes it as a pressure like feeling. Some radiation to his left arm and shortness of breath. At the time of my exam the patient feels that the pain has improved. States he has history of heart disease and stent placement.    Records reviewed. Per medical record review patient has a history of MI  Past Medical History:  Diagnosis Date  . Depression   . ETOH abuse   . GERD (gastroesophageal reflux disease)   . Hypercholesteremia   . Hypertension   . MI (myocardial infarction) ([place]) 2018   1 stent placed  . Sleep walking disorder   . Subarachnoid bleed [facility])     Patient Active Problem List   Diagnosis Date Noted  . Orthostatic hypotension 02/10/2019  . Subarachnoid bleed ([place])   . Hyponatremia 02/03/2019  . Depression 01/18/2019  . HTN (hypertension) 01/14/2019  . Alcohol withdrawal ([place]) 01/14/2019  . Anxiety state 01/14/2019  . MDD (major depressive disorder), recurrent episode, severe ([place]) 12/23/2018  . Severe recurrent major depression without psychotic features ([place]) 12/15/2018  . Suicidal behavior 12/14/2018  . Alcohol abuse 12/12/2018  . Non-STEMI (non-ST elevated myocardial infarction) ([place]) 12/29/2016    Past Surgical History:  Procedure Laterality Date  . APPENDECTOMY    . CORONARY STENT INTERVENTION N/A 12/30/2016   Procedure: Coronary Stent Intervention;  Surgeon: Wellington Hampshire, MD;  Location: [place] CV LAB;  Service: Cardiovascular;  Laterality: N/A;  . KNEE SURGERY    . LEFT HEART CATH AND CORONARY ANGIOGRAPHY N/A 12/30/2016   Procedure: Left Heart Cath and Coronary Angiography;  Surgeon: Wellington Hampshire, MD;  Location: [place] CV LAB;  Service: Cardiovascular;  Laterality: N/A;    Prior to Admission medications   Medication Sig Start Date End Date Taking? Authorizing Provider  acetaminophen (TYLENOL) 500 MG tablet Take 1,000 mg by mouth at bedtime as needed for headache.    [provider]  aspirin EC 81 MG tablet Take 1 tablet (81 mg total) by mouth daily. Restart on 02/24/2019 02/24/19   Mariel Aloe, MD  hydrochlorothiazide (HYDRODIURIL) 25 MG tablet Take 25 mg by mouth every morning.    [provider]  lisinopril (ZESTRIL) 40 MG tablet Take 40 mg by mouth every morning. 02/10/19   [provider]  metoprolol tartrate (LOPRESSOR) 50 MG tablet Take 0.5 tablets (25 mg total) by mouth 2 (two) times daily. 02/10/19   Mariel Aloe, MD  omeprazole (PRILOSEC) 20 MG capsule Take 1 capsule (20 mg total) by mouth daily. Patient taking differently: Take 20 mg by mouth every morning.  02/16/19 05/17/19  Towanda Malkin, MD  rosuvastatin (CRESTOR) 40 MG tablet Take 1 tablet (40 mg total) by mouth every evening. 02/17/19   Wellington Hampshire, MD  sertraline (ZOLOFT) 50 MG tablet Take 50 mg by mouth every morning.  12/21/18   [provider]  Allergies Compazine [prochlorperazine edisylate]  Family History  Problem Relation Age of Onset  . Hypertension Mother   . Hyperlipidemia Father     Social History Social History   Tobacco Use  . Smoking status: Current Every Day Smoker    Packs/day: 0.25    Years: 30.00    Pack years: 7.50    Types: Cigarettes  . Smokeless tobacco: Never Used  Substance Use Topics  . Alcohol use: Yes     Alcohol/week: 11.0 standard drinks    Types: 8 Cans of beer, 3 Shots of liquor per week    Comment: 1/2 pint tequila a day, pt states that he is trying to quit   . Drug use: No    Review of Systems Constitutional: No fever/chills Eyes: No visual changes. ENT: No sore throat. Cardiovascular: Positive for chest pain. Respiratory: Positive for shortness of breath. Gastrointestinal: No abdominal pain.  No nausea, no vomiting.  No diarrhea.   Genitourinary: Negative for dysuria. Musculoskeletal: Negative for back pain. Skin: Negative for rash. Neurological: Negative for headaches, focal weakness or numbness.  ____________________________________________   PHYSICAL EXAM:  VITAL SIGNS: ED Triage Vitals  Enc Vitals Group     BP 02/21/19 2300 122/73     Pulse Rate 02/21/19 2300 94     Resp 02/21/19 2300 16     Temp 02/21/19 2303 98.1 F (36.7 C)     Temp Source 02/21/19 2303 Oral     SpO2 02/21/19 2300 94 %     Weight 02/21/19 2248 185 lb (83.9 kg)     Height 02/21/19 2248 5\' 9"  (1.753 m)     Head Circumference --      Peak Flow --      Pain Score 02/21/19 2248 6   Constitutional: Alert and oriented.  Eyes: Conjunctivae are normal.  ENT      Head: Normocephalic and atraumatic.      Nose: No congestion/rhinnorhea.      Mouth/Throat: Mucous membranes are moist.      Neck: No stridor. Hematological/Lymphatic/Immunilogical: No cervical lymphadenopathy. Cardiovascular: Normal rate, regular rhythm.  No murmurs, rubs, or gallops.  Respiratory: Normal respiratory effort without tachypnea nor retractions. Breath sounds are clear and equal bilaterally. No wheezes/rales/rhonchi. Gastrointestinal: Soft and non tender. No rebound. No guarding.  Genitourinary: Deferred Musculoskeletal: Normal range of motion in all extremities. No lower extremity edema. Neurologic:  Normal speech and language. No gross focal neurologic deficits are appreciated.  Skin:  Skin is warm, dry and intact. No  rash noted. Psychiatric: Mood and affect are normal. Speech and behavior are normal. Patient exhibits appropriate insight and judgment.  ____________________________________________    LABS (pertinent positives/negatives)  CBC wbc 10.1, hgb 9.5, plt 453 Trop hs 7 BMP na 130, k 3.2, cl 90, cr 0.57 ____________________________________________   EKG  I, Phineas SemenGraydon Kamron Portee, attending physician, personally viewed and interpreted this EKG  EKG Time: 2248 Rate: 102 Rhythm: sinus tachycardia Axis: normal Intervals: qtc 477 QRS: narrow ST changes: no st elevation Impression: abnormal ekg  ____________________________________________    RADIOLOGY  CXR No new acute findings  ____________________________________________   PROCEDURES  Procedures  ____________________________________________   INITIAL IMPRESSION / ASSESSMENT AND PLAN / ED COURSE  Pertinent labs & imaging results that were available during my care of the patient were reviewed by me and considered in my medical decision making (see chart for details).   Patient presents to the emergency department today because of concern for chest pain. Started after he left  the hospital for alcohol use. The patient does have history of heart disease. Troponin was checked and was negative twice. Will have tts evaluate for detox.   ____________________________________________   FINAL CLINICAL IMPRESSION(S) / ED DIAGNOSES  Final diagnoses:  Chest pain     Note: This dictation was prepared with Dragon dictation. Any transcriptional errors that result from this process are unintentional     Phineas Semen, MD 02/22/19 [phone]

## 2019-02-21 NOTE — ED Notes (Signed)
Pt provided with urinal

## 2019-02-21 NOTE — ED Notes (Signed)
Charge RN called to see if there was a bed available for pt as he appears to be intoxicated. No bed available at this time. Will place pt in the lobby near RN and Officer so that he can be monitored while in the lobby.

## 2019-02-21 NOTE — ED Provider Notes (Addendum)
[facility] Emergency Department Provider Note  ____________________________________________  Time seen: Approximately 10:42 AM  I have reviewed the triage vital signs and the nursing notes.   HISTORY  Chief Complaint Alcohol Intoxication    HPI Dustin Velazquez is a 58 y.o. male with a history of alcohol abuse, GERD, hypertension, CAD status post MI 2 years ago who comes the ED due to alcohol intoxication, requesting assistance with detox.  Reports that he drinks every day, at least a sixpack of beer and a pint of tequila.  Last drink was this morning just before coming to the ED by EMS.  Denies any acute pain, no vomiting hematemesis black or bloody stool.  No chest pain or shortness of breath.    Reports withdrawal symptoms of tremulousness, sweating, flushing and restlessness in the mornings.  Denies a history of seizures or hallucinations.  Reports compliance with his daily metoprolol and lisinopril.   Patient is somewhat euphoric, talking loud, repetitive.   Past Medical History:  Diagnosis Date  . Depression   . ETOH abuse   . GERD (gastroesophageal reflux disease)   . Hypercholesteremia   . Hypertension   . MI (myocardial infarction) (HCC) 2018   1 stent placed  . Sleep walking disorder   . Subarachnoid bleed [employer])      Patient Active Problem List   Diagnosis Date Noted  . Orthostatic hypotension 02/10/2019  . Subarachnoid bleed (HCC)   . Hyponatremia 02/03/2019  . Depression 01/18/2019  . HTN (hypertension) 01/14/2019  . Alcohol withdrawal (HCC) 01/14/2019  . Anxiety state 01/14/2019  . MDD (major depressive disorder), recurrent episode, severe (HCC) 12/23/2018  . Severe recurrent major depression without psychotic features (HCC) 12/15/2018  . Suicidal behavior 12/14/2018  . Alcohol abuse 12/12/2018  . Non-STEMI (non-ST elevated myocardial infarction) (HCC) 12/29/2016     Past Surgical History:  Procedure Laterality Date  .  APPENDECTOMY    . CORONARY STENT INTERVENTION N/A 12/30/2016   Procedure: Coronary Stent Intervention;  Surgeon: Iran Ouch, MD;  Location: [facility] INVASIVE CV LAB;  Service: Cardiovascular;  Laterality: N/A;  . KNEE SURGERY    . LEFT HEART CATH AND CORONARY ANGIOGRAPHY N/A 12/30/2016   Procedure: Left Heart Cath and Coronary Angiography;  Surgeon: Iran Ouch, MD;  Location: [facility] INVASIVE CV LAB;  Service: Cardiovascular;  Laterality: N/A;     Prior to Admission medications   Medication Sig Start Date End Date Taking? Authorizing Provider  acetaminophen (TYLENOL) 500 MG tablet Take 1,000 mg by mouth at bedtime as needed for headache.    [provider]  aspirin EC 81 MG tablet Take 1 tablet (81 mg total) by mouth daily. Restart on 02/24/2019 02/24/19   Narda Bonds, MD  hydrochlorothiazide (HYDRODIURIL) 25 MG tablet Take 25 mg by mouth every morning.    [provider]  lisinopril (ZESTRIL) 40 MG tablet Take 40 mg by mouth every morning. 02/10/19   [provider]  metoprolol tartrate (LOPRESSOR) 50 MG tablet Take 0.5 tablets (25 mg total) by mouth 2 (two) times daily. 02/10/19   Narda Bonds, MD  omeprazole (PRILOSEC) 20 MG capsule Take 1 capsule (20 mg total) by mouth daily. Patient taking differently: Take 20 mg by mouth every morning.  02/16/19 05/17/19  Jamelle Haring, MD  rosuvastatin (CRESTOR) 40 MG tablet Take 1 tablet (40 mg total) by mouth every evening. 02/17/19   Iran Ouch, MD  sertraline (ZOLOFT) 50 MG tablet Take 50 mg  by mouth every morning.  12/21/18   [provider]     Allergies Compazine [prochlorperazine edisylate]   Family History  Problem Relation Age of Onset  . Hypertension Mother   . Hyperlipidemia Father     Social History Social History   Tobacco Use  . Smoking status: Current Every Day Smoker    Packs/day: 0.25    Years: 30.00    Pack years: 7.50    Types: Cigarettes  . Smokeless tobacco:  Never Used  Substance Use Topics  . Alcohol use: Yes    Alcohol/week: 11.0 standard drinks    Types: 8 Cans of beer, 3 Shots of liquor per week    Comment: 1/2 pint tequila a day, pt states that he is trying to quit   . Drug use: No    Review of Systems  Constitutional:   No fever or chills.  ENT:   No sore throat. No rhinorrhea. Cardiovascular:   No chest pain or syncope. Respiratory:   No dyspnea or cough. Gastrointestinal:   Negative for abdominal pain, vomiting and diarrhea.  Musculoskeletal:   Negative for focal pain or swelling All other systems reviewed and are negative except as documented above in ROS and HPI.  ____________________________________________   PHYSICAL EXAM:  VITAL SIGNS: ED Triage Vitals  Enc Vitals Group     BP 02/21/19 0946 (!) 115/54     Pulse Rate 02/21/19 0946 (!) 120     Resp 02/21/19 0946 18     Temp 02/21/19 0943 98.2 F (36.8 C)     Temp Source 02/21/19 0943 Oral     SpO2 02/21/19 0946 100 %     Weight 02/21/19 0944 170 lb (77.1 kg)     Height 02/21/19 0944 5\' 9"  (1.753 m)     Head Circumference --      Peak Flow --      Pain Score 02/21/19 0943 0     Pain Loc --      Pain Edu? --      Excl. in GC? --     Vital signs reviewed, nursing assessments reviewed.   Constitutional:   Alert and oriented. Non-toxic appearance. Eyes:   Conjunctivae are normal. EOMI. PERRL. ENT      Head:   Normocephalic and atraumatic.      Nose:   No congestion/rhinnorhea.       Mouth/Throat:   MMM, no pharyngeal erythema. No peritonsillar mass.       Neck:   No meningismus. Full ROM. Hematological/Lymphatic/Immunilogical:   No cervical lymphadenopathy. Cardiovascular:   RRR. Symmetric bilateral radial and DP pulses.  No murmurs. Cap refill less than 2 seconds. Respiratory:   Normal respiratory effort without tachypnea/retractions. Breath sounds are clear and equal bilaterally. No wheezes/rales/rhonchi. Gastrointestinal:   Soft and nontender. Non  distended. There is no CVA tenderness.  No rebound, rigidity, or guarding.  Musculoskeletal:   Normal range of motion in all extremities. No joint effusions.  No lower extremity tenderness.  No edema.  Chest wall stable Neurologic:   Normal speech and language.  Motor grossly intact. No acute focal neurologic deficits are appreciated.  Skin:    Skin is warm, dry and intact. No rash noted.  No petechiae, purpura, or bullae.  ____________________________________________    LABS (pertinent positives/negatives) (all labs ordered are listed, but only abnormal results are displayed) Labs Reviewed  ACETAMINOPHEN LEVEL  ETHANOL  LIPASE, BLOOD  COMPREHENSIVE METABOLIC PANEL  CBC WITH DIFFERENTIAL/PLATELET   ____________________________________________  EKG    ____________________________________________    RADIOLOGY  No results found.  ____________________________________________   PROCEDURES Procedures  ____________________________________________    CLINICAL IMPRESSION / ASSESSMENT AND PLAN / ED COURSE  Medications ordered in the ED: Medications - No data to display  Pertinent labs & imaging results that were available during my care of the patient were reviewed by me and considered in my medical decision making (see chart for details).  TIMMOTHY BARANOWSKI was evaluated in Emergency Department on 02/21/2019 for the symptoms described in the history of present illness. He was evaluated in the context of the global COVID-19 pandemic, which necessitated consideration that the patient might be at risk for infection with the SARS-CoV-2 virus that causes COVID-19. Institutional protocols and algorithms that pertain to the evaluation of patients at risk for COVID-19 are in a state of rapid change based on information released by regulatory bodies including the CDC and federal and state organizations. These policies and algorithms were followed during the patient's care in the ED.      Clinical Course as of Feb 20 1041  Sun Feb 21, 2019  1041 Patient with daily alcohol abuse, last drink at about 9:00 today, requests assistance with alcohol detox.  No SI HI or hallucinations.  Not in withdrawal at this time.   [PS]    Clinical Course User Index [PS] Carrie Mew, MD     ____________________________________________   FINAL CLINICAL IMPRESSION(S) / ED DIAGNOSES    Final diagnoses:  Alcohol dependence with uncomplicated intoxication [facility])     ED Discharge Orders    None      Portions of this note were generated with dragon dictation software. Dictation errors may occur despite best attempts at proofreading.   Carrie Mew, MD 02/21/19 [place]    Carrie Mew, MD 02/21/19 1045

## 2019-02-21 NOTE — BH Assessment (Signed)
Writer received phone call from patient's nurse Donneta Romberg), patient left the ER.  Writer called facilities and told them he wouldn't be sending the referral and take his name off their list.

## 2019-02-21 NOTE — ED Notes (Signed)
Pt requesting "something to sleep". md notified, no new orders received.

## 2019-02-21 NOTE — BH Assessment (Addendum)
Patient is a requesting to go to [facility] in [place], [place]. Writer informed him, he was uncertain if they have an admission person over the weekend but will check. Writer also offered to look for other places as well. Patient was in agreement with the plan. Patient asked about facilities in [place], Probation officer told him there wasn't any he's aware of that take his insurance.   Writer spoke with detox facilities for possible admission:   [facility]  ([phone]), obtained the needed information to faxed to them.    [facility]-([phone]), they have available beds. Advised to faxed patient's information.   [facility] ([phone]), patient was declined. Admission criteria requires patient to be dually diagnosed and not solely detox. Shared patient has dx of depression but because his mental health dx wasn't as severe. Since patient isn't having any SI/HI or AV/H he doesn't meet criteria.   [facility] [phone]), left a voicemail message requesting a return phone call.   [facility] [phone]), advised to faxed information.   Patient told Probation officer he fell at home in the shower "two weeks ago and hit my head." Writer informed (called) the patient's nurse Donneta Romberg) and she asked Probation officer to let the MD know, she was in another room with a patient. Writer updated the ER MD (Dr. Joni Fears) and he ordered test. Writer went back to the patient and let him know he shared with the ER MD what he had said and he was going to get it looked at. The patient stated, "I already had my brain looked at in [place] the other day. They said I was okay..." Writer updated the ER MD what was said and patient was seen at [facility] ER for the fall.

## 2019-02-21 NOTE — ED Notes (Signed)
Patient stated he was getting tired of sitting and if he was going to sit and lay down he can lay down in his own bed, and he then began putting on his shoes. Writer called TTS to get an update on patient status and TTS Calvin said he is still calling around nothing definite yet. Writer informed patient that if he stayed we may find him somewhere to go but patient continued to walk out  MD notified and [facility] ED secretary notified

## 2019-02-21 NOTE — ED Notes (Addendum)
Patient assigned to appropriate care area   Introduced self to pt  Patient oriented to unit/care area: Informed that, for their safety, care areas are designed for safety and visiting and phone hours explained to patient. Patient verbalizes understanding, and verbal contract for safety obtained  Environment secured    Patient discussed wanting help from detox, patient says he likes to drink beer and alcohol. Patient said his wife died in 11-Sep-2022 and he has been in and out of different tx facilities Patient talking very loudly

## 2019-02-21 NOTE — ED Notes (Signed)
Pt being excessively loud and cussing in the lobby. RN spoke with pt that reminded him that this language is not appropriate. Pt apologized.

## 2019-02-21 NOTE — ED Notes (Signed)
Pt assisted to restroom by this RN.  Pt given water to drink

## 2019-02-21 NOTE — ED Notes (Signed)
Pt states "if im just going to sit here in the hallway, Im just going to leave".  Pt put shoes and hat on and walked out.  RN aware.  Followed pt to make sure he walked out of the hospital.  Pt is sitting on the concrete blocks outside of entrance.

## 2019-02-21 NOTE — ED Notes (Signed)
Patient in the hall talking loudly and being very rude to staff and officer, he is very demanding and at times obnoxious. Patient continued to yell im sick  MD notified

## 2019-02-21 NOTE — ED Triage Notes (Addendum)
Patient arrived by EMS for detox from home. Patient highly intoxicated. EMS reports last drink of alcohol was right before they got to home

## 2019-02-21 NOTE — ED Notes (Signed)
Pt notified of md declination for sleep medication.

## 2019-02-21 NOTE — ED Notes (Signed)
Pt given sandwich tray and grape juice

## 2019-02-22 ENCOUNTER — Ambulatory Visit: Payer: Self-pay | Admitting: Internal Medicine

## 2019-02-22 DIAGNOSIS — F10220 Alcohol dependence with intoxication, uncomplicated: Secondary | ICD-10-CM | POA: Diagnosis not present

## 2019-02-22 DIAGNOSIS — I10 Essential (primary) hypertension: Secondary | ICD-10-CM | POA: Diagnosis not present

## 2019-02-22 DIAGNOSIS — G4700 Insomnia, unspecified: Secondary | ICD-10-CM | POA: Diagnosis not present

## 2019-02-22 DIAGNOSIS — E785 Hyperlipidemia, unspecified: Secondary | ICD-10-CM | POA: Diagnosis not present

## 2019-02-22 DIAGNOSIS — R69 Illness, unspecified: Secondary | ICD-10-CM | POA: Diagnosis not present

## 2019-02-22 DIAGNOSIS — F17200 Nicotine dependence, unspecified, uncomplicated: Secondary | ICD-10-CM | POA: Diagnosis not present

## 2019-02-22 DIAGNOSIS — Z8679 Personal history of other diseases of the circulatory system: Secondary | ICD-10-CM | POA: Diagnosis not present

## 2019-02-22 DIAGNOSIS — F331 Major depressive disorder, recurrent, moderate: Secondary | ICD-10-CM | POA: Diagnosis not present

## 2019-02-22 DIAGNOSIS — K219 Gastro-esophageal reflux disease without esophagitis: Secondary | ICD-10-CM | POA: Diagnosis not present

## 2019-02-22 LAB — TROPONIN I (HIGH SENSITIVITY): Troponin I (High Sensitivity): 7 ng/L (ref <18)

## 2019-02-22 MED ORDER — LORAZEPAM 2 MG/ML IJ SOLN: 0.0000 mg | Freq: Two times a day (BID) | INTRAMUSCULAR | Status: DC

## 2019-02-22 MED ORDER — LORAZEPAM 2 MG/ML IJ SOLN
2.0000 mg | Freq: Once | INTRAMUSCULAR | Status: AC
  Administered 2019-02-22: 05:00:00 2 mg via INTRAVENOUS
  Filled 2019-02-22: qty 1

## 2019-02-22 MED ORDER — LORAZEPAM 2 MG PO TABS
2.0000 mg | ORAL_TABLET | Freq: Once | ORAL | Status: AC
  Administered 2019-02-22: 2 mg via ORAL
  Filled 2019-02-22: qty 1

## 2019-02-22 MED ORDER — SODIUM CHLORIDE 0.9 % IV BOLUS
1000.0000 mL | Freq: Once | INTRAVENOUS | Status: AC
  Administered 2019-02-22: 1000 mL via INTRAVENOUS

## 2019-02-22 MED ORDER — LORAZEPAM 2 MG PO TABS: 0.0000 mg | ORAL_TABLET | Freq: Four times a day (QID) | ORAL | Status: DC

## 2019-02-22 MED ORDER — LORAZEPAM 2 MG/ML IJ SOLN: 0.0000 mg | Freq: Four times a day (QID) | INTRAMUSCULAR | Status: DC

## 2019-02-22 MED ORDER — VITAMIN B-1 100 MG PO TABS: 100.0000 mg | ORAL_TABLET | Freq: Every day | ORAL | Status: DC

## 2019-02-22 MED ORDER — LORAZEPAM 2 MG PO TABS: 0.0000 mg | ORAL_TABLET | Freq: Two times a day (BID) | ORAL | Status: DC

## 2019-02-22 MED ORDER — THIAMINE HCL 100 MG/ML IJ SOLN: 100.0000 mg | Freq: Every day | INTRAMUSCULAR | Status: DC

## 2019-02-22 NOTE — ED Provider Notes (Addendum)
-----------------------------------------   11:06 AM on 02/22/2019 -----------------------------------------  Patient here for EtOH abuse signed out to me is medically cleared prior to my arrival, I did place him on CIWA protocol, he is pending transfer to outpatient rehab facility.  Rate at this time is 98 when I am in the room he is without tremulous or other signs of withdrawal.  Nonetheless I will give him Ativan prior to discharge for the ride over   Schuyler Amor, MD 02/22/19 1106    Schuyler Amor, MD 02/22/19 1110

## 2019-02-22 NOTE — ED Notes (Signed)
Pt standing up at foot of bed, has urinated on floor. Pt provided with another urinal. Pt has three urinals at this time.

## 2019-02-22 NOTE — ED Notes (Signed)
Pt sleeping. 

## 2019-02-22 NOTE — ED Notes (Signed)
Pt up to urinate again. Pt with tachycardia up to 134 with standing. md notified, order fro additional ns bolus received.

## 2019-02-22 NOTE — ED Notes (Signed)
Report to teresa, rn.  

## 2019-02-22 NOTE — ED Notes (Signed)
[facility]  CALLED  FOR  TRANSPORT   

## 2019-02-22 NOTE — ED Notes (Signed)
EMTALA reviewed by charge RN 

## 2019-02-22 NOTE — ED Notes (Signed)
Sandwich tray and orange juice given to pt

## 2019-02-22 NOTE — ED Notes (Signed)
Pt sleeping, resps unlabored. No tremors felt fingertip to fingertip. Spot pox obtained.

## 2019-02-22 NOTE — ED Notes (Signed)
Pt voided in urinal

## 2019-02-22 NOTE — ED Notes (Signed)
Report called to [facility] Baxter Flattery RN)

## 2019-02-22 NOTE — ED Notes (Signed)
Pt with nausea, wretching, complains of headache. Hands shaking slightly, md notified order for 2mg  iv ativan received.

## 2019-02-22 NOTE — ED Notes (Signed)
Pt sleeping. Pt has removed blood pressure cuff. Pt recently declined to have blood pressure cuff and pox in place.

## 2019-02-22 NOTE — BH Assessment (Signed)
Patient has been accepted to [facility].  Patient assigned to [facility]. Accepting physician is Dr. Marylou Flesher.  Call report to [phone].  Representative was [employer].   ER Staff is aware of it:  Lattie Haw, ER Secretary  Dr. Burlene Arnt, ER MD  Helene Kelp, Patient's Nurse

## 2019-02-22 NOTE — ED Notes (Signed)
Pt with noted tachycardia when standing or attempting to urinate. Pt has had about 3055mL of urine production since arrival. md notified. Order for NS bolus received.

## 2019-02-24 LAB — CULTURE, BLOOD (ROUTINE X 2)
Culture: NO GROWTH
Culture: NO GROWTH
Special Requests: ADEQUATE

## 2019-03-07 DIAGNOSIS — Q8789 Other specified congenital malformation syndromes, not elsewhere classified: Secondary | ICD-10-CM | POA: Diagnosis not present

## 2019-03-07 DIAGNOSIS — R69 Illness, unspecified: Secondary | ICD-10-CM | POA: Diagnosis not present

## 2019-03-07 DIAGNOSIS — K219 Gastro-esophageal reflux disease without esophagitis: Secondary | ICD-10-CM | POA: Diagnosis not present

## 2019-03-09 ENCOUNTER — Other Ambulatory Visit: Payer: Self-pay

## 2019-03-09 DIAGNOSIS — Z20822 Contact with and (suspected) exposure to covid-19: Secondary | ICD-10-CM

## 2019-03-09 DIAGNOSIS — R6889 Other general symptoms and signs: Secondary | ICD-10-CM | POA: Diagnosis not present

## 2019-03-11 LAB — NOVEL CORONAVIRUS, NAA: SARS-CoV-2, NAA: NOT DETECTED

## 2019-03-16 ENCOUNTER — Telehealth: Payer: Self-pay | Admitting: Internal Medicine

## 2019-03-16 NOTE — Telephone Encounter (Signed)
Patient would like his Covid test results faxed to [facility] please. Fax # [phone]

## 2019-03-17 ENCOUNTER — Other Ambulatory Visit: Payer: Self-pay

## 2019-03-17 ENCOUNTER — Encounter: Payer: Self-pay | Admitting: Emergency Medicine

## 2019-03-17 ENCOUNTER — Emergency Department
Admission: EM | Admit: 2019-03-17 | Discharge: 2019-03-17 | Disposition: A | Discharge status: Home / Self Care | Payer: 59 | Attending: Emergency Medicine | Admitting: Emergency Medicine

## 2019-03-17 DIAGNOSIS — F17210 Nicotine dependence, cigarettes, uncomplicated: Secondary | ICD-10-CM | POA: Diagnosis not present

## 2019-03-17 DIAGNOSIS — F10129 Alcohol abuse with intoxication, unspecified: Secondary | ICD-10-CM | POA: Insufficient documentation

## 2019-03-17 DIAGNOSIS — F329 Major depressive disorder, single episode, unspecified: Secondary | ICD-10-CM | POA: Diagnosis present

## 2019-03-17 DIAGNOSIS — I10 Essential (primary) hypertension: Secondary | ICD-10-CM | POA: Insufficient documentation

## 2019-03-17 DIAGNOSIS — Y908 Blood alcohol level of 240 mg/100 ml or more: Secondary | ICD-10-CM | POA: Insufficient documentation

## 2019-03-17 DIAGNOSIS — R69 Illness, unspecified: Secondary | ICD-10-CM | POA: Diagnosis not present

## 2019-03-17 DIAGNOSIS — F1010 Alcohol abuse, uncomplicated: Secondary | ICD-10-CM

## 2019-03-17 LAB — COMPREHENSIVE METABOLIC PANEL
ALT: 23 U/L (ref 0–44)
AST: 44 U/L — ABNORMAL HIGH (ref 15–41)
Albumin: 3.8 g/dL (ref 3.5–5.0)
Alkaline Phosphatase: 86 U/L (ref 38–126)
Anion gap: 14 (ref 5–15)
BUN: 9 mg/dL (ref 6–20)
CO2: 21 mmol/L — ABNORMAL LOW (ref 22–32)
Calcium: 8.8 mg/dL — ABNORMAL LOW (ref 8.9–10.3)
Chloride: 104 mmol/L (ref 98–111)
Creatinine, Ser: 0.87 mg/dL (ref 0.61–1.24)
GFR calc Af Amer: >60 mL/min (ref >60)
GFR calc non Af Amer: >60 mL/min (ref >60)
Glucose, Bld: 82 mg/dL (ref 70–99)
Potassium: 4.6 mmol/L (ref 3.5–5.1)
Sodium: 139 mmol/L (ref 135–145)
Total Bilirubin: 0.4 mg/dL (ref 0.3–1.2)
Total Protein: 7.2 g/dL (ref 6.5–8.1)

## 2019-03-17 LAB — URINE DRUG SCREEN, QUALITATIVE (ARMC ONLY)
Amphetamines, Ur Screen: NOT DETECTED
Barbiturates, Ur Screen: NOT DETECTED
Benzodiazepine, Ur Scrn: POSITIVE — AB
Cannabinoid 50 Ng, Ur ~~LOC~~: NOT DETECTED
Cocaine Metabolite,Ur ~~LOC~~: NOT DETECTED
MDMA (Ecstasy)Ur Screen: NOT DETECTED
Methadone Scn, Ur: NOT DETECTED
Opiate, Ur Screen: NOT DETECTED
Phencyclidine (PCP) Ur S: NOT DETECTED
Tricyclic, Ur Screen: NOT DETECTED

## 2019-03-17 LAB — CBC
HCT: 39.8 % (ref 39.0–52.0)
Hemoglobin: 13.2 g/dL (ref 13.0–17.0)
MCH: 32.3 pg (ref 26.0–34.0)
MCHC: 33.2 g/dL (ref 30.0–36.0)
MCV: 97.3 fL (ref 80.0–100.0)
Platelets: 418 10*3/uL — ABNORMAL HIGH (ref 150–400)
RBC: 4.09 MIL/uL — ABNORMAL LOW (ref 4.22–5.81)
RDW: 17.2 % — ABNORMAL HIGH (ref 11.5–15.5)
WBC: 7.4 10*3/uL (ref 4.0–10.5)
nRBC: 0 % (ref 0.0–0.2)

## 2019-03-17 LAB — ETHANOL: Alcohol, Ethyl (B): 262 mg/dL — ABNORMAL HIGH (ref <10)

## 2019-03-17 LAB — SALICYLATE LEVEL: Salicylate Lvl: <7 mg/dL (ref 2.8–30.0)

## 2019-03-17 LAB — ACETAMINOPHEN LEVEL: Acetaminophen (Tylenol), Serum: <10 ug/mL — ABNORMAL LOW (ref 10–30)

## 2019-03-17 MED ORDER — LORAZEPAM 1 MG PO TABS: 1.0000 mg | ORAL_TABLET | Freq: Once | ORAL | Status: DC

## 2019-03-17 NOTE — ED Triage Notes (Signed)
broguth by bpd for ivc.  Told father he was ready to end it all.  Was recently in rehab but had to be disch due to cough and he was tested for covid negative.  Patient speech slurred and appears intoxicated. He is in wheel chair.  Says he want s to go to [facility] in [place]

## 2019-03-17 NOTE — ED Notes (Addendum)
He is verbalizing  "I have a bed at [facility] - I got my covid test results today and they are negative and they said they would have a bed forme today"  Pt reassured  Assessment completed  He denies pain  TTS consult pending

## 2019-03-17 NOTE — ED Notes (Signed)
Pt observed walking down the hallway  "I am leaving - I've got to get my clothes together so that I can go to [facility] tomoroow - I will be alright - I ain't gonna hurt myself"

## 2019-03-17 NOTE — BH Assessment (Signed)
Assessment Note  Dustin Velazquez is an 58 y.o. male who presents to the ER via law enforcement due to his father petitioning for him to be under IVC. Per the patient, the father told the magistrate he voiced SI as a means to have him brought to the ER to get help for his alcohol use. Patient states he did not say he was going try to end his life.    Patient further reports he has arranged to be admitted with [facility], to address his alcohol addiction.   Per the request of ER MD (Dr. Jimmye Norman), writer called and spoke with admission at [facility] ([place]) and confirmed his bed will be available Saturday, September 11th, 2020.  Per the request of [facility], writer to told the patient to call them in them tomorrow (03/18/2019) morning after 8:30am.  Throughout the interview, the patient denied SI/HI and AV/H.  Diagnosis: Alcohol Use Disorder  Past Medical History:  Past Medical History:  Diagnosis Date  . Depression   . ETOH abuse   . GERD (gastroesophageal reflux disease)   . Hypercholesteremia   . Hypertension   . MI (myocardial infarction) ([place]) 2018   1 stent placed  . Sleep walking disorder   . Subarachnoid bleed [facility])     Past Surgical History:  Procedure Laterality Date  . APPENDECTOMY    . CORONARY STENT INTERVENTION N/A 12/30/2016   Procedure: Coronary Stent Intervention;  Surgeon: Wellington Hampshire, MD;  Location: [place] [facility];  Service: Cardiovascular;  Laterality: N/A;  . KNEE SURGERY    . LEFT HEART CATH AND CORONARY ANGIOGRAPHY N/A 12/30/2016   Procedure: Left Heart Cath and Coronary Angiography;  Surgeon: Wellington Hampshire, MD;  Location: [place] [facility];  Service: Cardiovascular;  Laterality: N/A;    Family History:  Family History  Problem Relation Age of Onset  . Hypertension Mother   . Hyperlipidemia Father     Social History:  reports that he has been smoking cigarettes. He has a 7.50 pack-year smoking history.  He has never used smokeless tobacco. He reports current alcohol use of about 11.0 standard drinks of alcohol per week. He reports that he does not use drugs.  Additional Social History:  Alcohol / Drug Use Pain Medications: See PTA Prescriptions: See PTA Over the Counter: See PTA History of alcohol / drug use?: Yes Longest period of sobriety (when/how long): Unable to quantify Substance #1 Name of Substance 1: Alcohol 1 - Last Use / Amount: 03/17/2019  CIWA: CIWA-Ar BP: (!) 108/48 Pulse Rate: 77 COWS:    Allergies:  Allergies  Allergen Reactions  . Compazine [Prochlorperazine Edisylate] Other (See Comments)    Seizures     Home Medications: (Not in a hospital admission)   OB/GYN Status:  No LMP for male patient.  General Assessment Data Location of Assessment: [facility] ED TTS Assessment: In system Is this a Tele or Face-to-Face Assessment?: Face-to-Face Is this an Initial Assessment or a Re-assessment for this encounter?: Initial Assessment Patient Accompanied by:: N/A Language Other than English: No Living Arrangements: Other (Comment)(Private Home) What gender do you identify as?: Male Marital status: Separated Maiden name: Jeani Hawking Pregnancy Status: No Living Arrangements: Alone Can pt return to current living arrangement?: Yes Admission Status: Voluntary Is patient capable of signing voluntary admission?: Yes Referral Source: Self/Family/Friend Insurance type: [place] Screening Exam ([place]) Medical Exam completed: Yes  Crisis Care Plan Living Arrangements: Alone Legal Guardian: Other:(Self) Name of Psychiatrist:  Reports of none Name of Therapist: [facility]  Education Status Is patient currently in school?: No Highest grade of school patient has completed: High School Diploma Is the patient employed, unemployed or receiving disability?: Employed  Risk to self with the past 6 months Suicidal Ideation: No Has patient been a  risk to self within the past 6 months prior to admission? : No Suicidal Intent: No Has patient had any suicidal intent within the past 6 months prior to admission? : No Is patient at risk for suicide?: No Suicidal Plan?: No Has patient had any suicidal plan within the past 6 months prior to admission? : No Access to Means: No Specify Access to Suicidal Means: Reports of none  What has been your use of drugs/alcohol within the last 12 months?: Alcohol Previous Attempts/Gestures: No How many times?: 0 Other Self Harm Risks: Active Alcohol Abuse Triggers for Past Attempts: None known Intentional Self Injurious Behavior: None Family Suicide History: No Recent stressful life event(s): Loss (Comment), Divorce Persecutory voices/beliefs?: No Depression: Yes Depression Symptoms: Guilt Substance abuse history and/or treatment for substance abuse?: Yes Suicide prevention information given to non-admitted patients: Not applicable  Risk to Others within the past 6 months Homicidal Ideation: No Does patient have any lifetime risk of violence toward others beyond the six months prior to admission? : No Thoughts of Harm to Others: No Current Homicidal Intent: No Current Homicidal Plan: No Access to Homicidal Means: No Identified Victim: Reports of none History of harm to others?: No Assessment of Violence: None Noted Violent Behavior Description: Reports of none Does patient have access to weapons?: No Criminal Charges Pending?: No Does patient have a court date: No Is patient on probation?: No  Psychosis Hallucinations: None noted Delusions: None noted  Mental Status Report Appearance/Hygiene: Unremarkable, In scrubs Eye Contact: Fair Motor Activity: Freedom of movement, Unremarkable Speech: Logical/coherent, Loud, Slurred, Unremarkable Level of Consciousness: Alert Mood: Anxious, Pleasant Affect: Appropriate to circumstance, Anxious Anxiety Level: Minimal Thought Processes:  Coherent, Relevant Judgement: Partial Orientation: Person, Place, Time, Situation, Appropriate for developmental age Obsessive Compulsive Thoughts/Behaviors: None  Cognitive Functioning Concentration: Normal Memory: Recent Intact, Remote Intact Is patient IDD: No Insight: Fair Impulse Control: Fair Appetite: Good Have you had any weight changes? : No Change Sleep: No Change Total Hours of Sleep: 6 Vegetative Symptoms: None  ADLScreening [facility]) Patient's cognitive ability adequate to safely complete daily activities?: Yes Patient able to express need for assistance with ADLs?: Yes Independently performs ADLs?: Yes (appropriate for developmental age)  Prior Inpatient Therapy Prior Inpatient Therapy: Yes Prior Therapy Dates: 02/2019 & 12/2018(2x) Prior Therapy Facilty/Provider(s): [facility] [place], [facility], [facility] [place] Reason for Treatment: Alcohol Detox and Substance Abuse Treatment  Prior Outpatient Therapy Prior Outpatient Therapy: Yes Prior Therapy Dates: Current Prior Therapy Facilty/Provider(s[facility] Reason for Treatment: Alcohol Abuse Disorder Does patient have an ACCT team?: No Does patient have Intensive In-House Services?  : No Does patient have [facility] services? : No Does patient have P4CC services?: No  ADL Screening (condition at time of admission) Patient's cognitive ability adequate to safely complete daily activities?: Yes Is the patient deaf or have difficulty hearing?: No Does the patient have difficulty seeing, even when wearing glasses/contacts?: No Does the patient have difficulty concentrating, remembering, or making decisions?: No Patient able to express need for assistance with ADLs?: Yes Does the patient have difficulty dressing or bathing?: No Independently performs ADLs?: Yes (appropriate for developmental  age) Does the patient have difficulty walking or climbing stairs?:  No Weakness of Legs: None Weakness of Arms/Hands: None  Home Assistive Devices/Equipment Home Assistive Devices/Equipment: None  Therapy Consults (therapy consults require a physician order) PT Evaluation Needed: No OT Evalulation Needed: No SLP Evaluation Needed: No Abuse/Neglect Assessment (Assessment to be complete while patient is alone) Abuse/Neglect Assessment Can Be Completed: Yes Physical Abuse: Denies Verbal Abuse: Denies Sexual Abuse: Denies Exploitation of patient/patient's resources: Denies Self-Neglect: Denies Values / Beliefs Cultural Requests During Hospitalization: None Spiritual Requests During Hospitalization: None Consults Spiritual Care Consult Needed: No Social Work Consult Needed: No Merchant navy officer (For Healthcare) Does Patient Have a Medical Advance Directive?: No    Child/Adolescent Assessment Running Away Risk: Denies(Patient is an adult)  Disposition:  Disposition Initial Assessment Completed for this Encounter: Yes  On Site Evaluation by:   Reviewed with Physician:    Lilyan Gilford MS, LCAS, [facility], [facility] Therapeutic Triage Specialist 03/17/2019 9:28 PM

## 2019-03-17 NOTE — ED Notes (Signed)
Pt blood drawn

## 2019-03-17 NOTE — ED Notes (Signed)
BEHAVIORAL HEALTH ROUNDING Patient sleeping: No. Patient alert and oriented: yes Behavior appropriate: Yes.  ; If no, describe:  Nutrition and fluids offered: yes Toileting and hygiene offered: Yes  Sitter present: q15 minute observations Law enforcement present: Yes  ACSD  

## 2019-03-17 NOTE — ED Notes (Signed)
Pt up - stating  "I am ready to go - that doctor told me I could go and just get someone to take me to [facility] tomorrow - I am ready to go"  Pt reassured informed him that  - that was the counselor and that he needs to wait to see what the doctor says"

## 2019-03-17 NOTE — ED Provider Notes (Signed)
-----------------------------------------   4:51 PM on 03/17/2019 -----------------------------------------  Blood pressure (!) 108/48, pulse 77, temperature 98 F (36.7 C), temperature source Oral, resp. rate 18, SpO2 97 %.  Assuming care from Dr. Jimmye Norman.  In short, Dustin Velazquez is a 58 y.o. male with a chief complaint of Psychiatric Evaluation and Alcohol Intoxication .  Refer to the original H&P for additional details.  The current plan of care is to await possible detox placement, patient continues to deny SI/HI and IVC rescinded by Dr. Jimmye Norman.  Patient expressed desire to transport himself to rehab facility, however informed that this would not be possible given his blood alcohol level, he will need to wait until he is clinically sober or has a sober ride.  Unfortunately, patient eloped from the ED prior to this.      Blake Divine, MD 03/17/19 [phone]

## 2019-03-17 NOTE — ED Notes (Signed)
BEHAVIORAL HEALTH ROUNDING Patient sleeping: No. Patient alert and oriented: yes Behavior appropriate: Yes.  ; If no, describe:  Nutrition and fluids offered: yes Toileting and hygiene offered: Yes  Sitter present: q15 minute observations  Law enforcement present: Yes  ACSD  ENVIRONMENTAL ASSESSMENT Potentially harmful objects out of patient reach: Yes.   Personal belongings secured: Yes.   Patient dressed in hospital provided attire only: Yes.   Plastic bags out of patient reach: Yes.   Patient care equipment (cords, cables, call bells, lines, and drains) shortened, removed, or accounted for: Yes.   Equipment and supplies removed from bottom of stretcher: Yes.   Potentially toxic materials out of patient reach: Yes.   Sharps container removed or out of patient reach: Yes.    

## 2019-03-17 NOTE — ED Notes (Signed)
VOL, papers rescinded by Dr Jimmye Norman, pend TTS eval

## 2019-03-17 NOTE — ED Provider Notes (Signed)
[facility] Emergency Department Provider Note       Time seen: ----------------------------------------- 2:41 PM on 03/17/2019 -----------------------------------------   I have reviewed the triage vital signs and the nursing notes.  HISTORY   Chief Complaint Psychiatric Evaluation and Alcohol Intoxication    HPI Dustin Velazquez is a 58 y.o. male with a history of depression, alcohol abuse, hyperlipidemia, hypertension, MI, subarachnoid hemorrhage who presents to the ED for involuntary commitment.  He told his father he was ready to end it all although he denies this now.  He was recently in rehab and states he is an alcoholic.  Patient states he has had significant alcohol today, wants to go to [facility] in [place].  Past Medical History:  Diagnosis Date  . Depression   . ETOH abuse   . GERD (gastroesophageal reflux disease)   . Hypercholesteremia   . Hypertension   . MI (myocardial infarction) ([place]) 2018   1 stent placed  . Sleep walking disorder   . Subarachnoid bleed [facility])     Patient Active Problem List   Diagnosis Date Noted  . Orthostatic hypotension 02/10/2019  . Subarachnoid bleed ([place])   . Hyponatremia 02/03/2019  . Depression 01/18/2019  . HTN (hypertension) 01/14/2019  . Alcohol withdrawal ([place]) 01/14/2019  . Anxiety state 01/14/2019  . MDD (major depressive disorder), recurrent episode, severe ([place]) 12/23/2018  . Severe recurrent major depression without psychotic features ([place]) 12/15/2018  . Suicidal behavior 12/14/2018  . Alcohol abuse 12/12/2018  . Non-STEMI (non-ST elevated myocardial infarction) ([place]) 12/29/2016    Past Surgical History:  Procedure Laterality Date  . APPENDECTOMY    . CORONARY STENT INTERVENTION N/A 12/30/2016   Procedure: Coronary Stent Intervention;  Surgeon: Wellington Hampshire, MD;  Location: [place] [facility];  Service: Cardiovascular;  Laterality: N/A;  . KNEE SURGERY    . LEFT HEART  CATH AND CORONARY ANGIOGRAPHY N/A 12/30/2016   Procedure: Left Heart Cath and Coronary Angiography;  Surgeon: Wellington Hampshire, MD;  Location: [place] [facility];  Service: Cardiovascular;  Laterality: N/A;    Allergies Compazine [prochlorperazine edisylate]  Social History Social History   Tobacco Use  . Smoking status: Current Every Day Smoker    Packs/day: 0.25    Years: 30.00    Pack years: 7.50    Types: Cigarettes  . Smokeless tobacco: Never Used  Substance Use Topics  . Alcohol use: Yes    Alcohol/week: 11.0 standard drinks    Types: 8 Cans of beer, 3 Shots of liquor per week    Comment: 1/2 pint tequila a day, pt states that he is trying to quit   . Drug use: No   Review of Systems Constitutional: Negative for fever. Cardiovascular: Negative for chest pain. Respiratory: Negative for shortness of breath. Gastrointestinal: Negative for abdominal pain, vomiting and diarrhea. Musculoskeletal: Negative for back pain. Skin: Negative for rash. Neurological: Negative for headaches, focal weakness or numbness. Psychiatric: Positive for alcohol use disorder, negative for suicidal thoughts  All systems negative/normal/unremarkable except as stated in the HPI  ____________________________________________   PHYSICAL EXAM:  VITAL SIGNS: ED Triage Vitals  Enc Vitals Group     BP 03/17/19 1407 (!) 108/48     Pulse Rate 03/17/19 1403 77     Resp 03/17/19 1403 18     Temp 03/17/19 1403 98 F (36.7 C)     Temp Source 03/17/19 1403 Oral     SpO2 03/17/19 1403 97 %  Weight --      Height --      Head Circumference --      Peak Flow --      Pain Score 03/17/19 1404 0     Pain Loc --      Pain Edu? --      Excl. in GC? --    Constitutional: Alert, well appearing and in no distress. Eyes: Conjunctivae are normal. Normal extraocular movements. Cardiovascular: Normal rate, regular rhythm. No murmurs, rubs, or gallops. Respiratory: Normal respiratory effort without  tachypnea nor retractions. Breath sounds are clear and equal bilaterally. No wheezes/rales/rhonchi. Gastrointestinal: Soft and nontender. Normal bowel sounds Musculoskeletal: Nontender with normal range of motion in extremities. No lower extremity tenderness nor edema. Neurologic:  Normal speech and language. No gross focal neurologic deficits are appreciated.  Skin:  Skin is warm, dry and intact. No rash noted. Psychiatric: Mood and affect are normal. Speech and behavior are normal.  ____________________________________________  ED COURSE:  As part of my medical decision making, I reviewed the following data within the electronic MEDICAL RECORD NUMBER History obtained from family if available, nursing notes, old chart and ekg, as well as notes from prior ED visits. Patient presented for alcohol use disorder and involuntary commitment, we will assess with labs as indicated at this time.   Procedures  DODGER SHY was evaluated in Emergency Department on 03/17/2019 for the symptoms described in the history of present illness. He was evaluated in the context of the global COVID-19 pandemic, which necessitated consideration that the patient might be at risk for infection with the SARS-CoV-2 virus that causes COVID-19. Institutional protocols and algorithms that pertain to the evaluation of patients at risk for COVID-19 are in a state of rapid change based on information released by regulatory bodies including the CDC and federal and state organizations. These policies and algorithms were followed during the patient's care in the ED.  ____________________________________________   LABS (pertinent positives/negatives)  Labs Reviewed  COMPREHENSIVE METABOLIC PANEL  ETHANOL  SALICYLATE LEVEL  ACETAMINOPHEN LEVEL  CBC  URINE DRUG SCREEN, QUALITATIVE ([facility] ONLY)  ____________________________________________   DIFFERENTIAL DIAGNOSIS   Alcohol use disorder, suicidal ideation, anxiety, substance  abuse  FINAL ASSESSMENT AND PLAN  Alcohol use disorder   Plan: The patient had presented for alcohol use disorder requesting detox. Patient's labs are still pending at this time, anticipate discharge to a detox facility.  He does not appear to be a threat to himself or anybody else at this time.  I would not continue the involuntary commitment  Ulice Dash, MD    Note: This note was generated in part or whole with voice recognition software. Voice recognition is usually quite accurate but there are transcription errors that can and very often do occur. I apologize for any typographical errors that were not detected and corrected.     Emily Filbert, MD 03/17/19 [phone]

## 2019-03-23 ENCOUNTER — Emergency Department (HOSPITAL_COMMUNITY)
Admission: EM | Admit: 2019-03-23 | Discharge: 2019-03-23 | Discharge status: Against Medical Advice | Payer: 59 | Attending: Emergency Medicine | Admitting: Emergency Medicine

## 2019-03-23 ENCOUNTER — Other Ambulatory Visit: Payer: Self-pay

## 2019-03-23 DIAGNOSIS — M545 Low back pain: Secondary | ICD-10-CM | POA: Insufficient documentation

## 2019-03-23 DIAGNOSIS — Z5321 Procedure and treatment not carried out due to patient leaving prior to being seen by health care provider: Secondary | ICD-10-CM | POA: Diagnosis not present

## 2019-03-23 DIAGNOSIS — R509 Fever, unspecified: Secondary | ICD-10-CM | POA: Diagnosis present

## 2019-03-23 LAB — COMPREHENSIVE METABOLIC PANEL
ALT: 388 U/L — ABNORMAL HIGH (ref 0–44)
AST: 891 U/L — ABNORMAL HIGH (ref 15–41)
Albumin: 3.4 g/dL — ABNORMAL LOW (ref 3.5–5.0)
Alkaline Phosphatase: 184 U/L — ABNORMAL HIGH (ref 38–126)
Anion gap: 8 (ref 5–15)
BUN: 11 mg/dL (ref 6–20)
CO2: 26 mmol/L (ref 22–32)
Calcium: 9.3 mg/dL (ref 8.9–10.3)
Chloride: 97 mmol/L — ABNORMAL LOW (ref 98–111)
Creatinine, Ser: 0.77 mg/dL (ref 0.61–1.24)
GFR calc Af Amer: >60 mL/min (ref >60)
GFR calc non Af Amer: >60 mL/min (ref >60)
Glucose, Bld: 125 mg/dL — ABNORMAL HIGH (ref 70–99)
Potassium: 3.7 mmol/L (ref 3.5–5.1)
Sodium: 131 mmol/L — ABNORMAL LOW (ref 135–145)
Total Bilirubin: 0.9 mg/dL (ref 0.3–1.2)
Total Protein: 6.9 g/dL (ref 6.5–8.1)

## 2019-03-23 LAB — CBC WITH DIFFERENTIAL/PLATELET
Abs Immature Granulocytes: 0.23 10*3/uL — ABNORMAL HIGH (ref 0.00–0.07)
Basophils Absolute: 0.1 10*3/uL (ref 0.0–0.1)
Basophils Relative: 0 %
Eosinophils Absolute: 0 10*3/uL (ref 0.0–0.5)
Eosinophils Relative: 0 %
HCT: 37.3 % — ABNORMAL LOW (ref 39.0–52.0)
Hemoglobin: 12 g/dL — ABNORMAL LOW (ref 13.0–17.0)
Immature Granulocytes: 2 %
Lymphocytes Relative: 2 %
Lymphs Abs: 0.3 10*3/uL — ABNORMAL LOW (ref 0.7–4.0)
MCH: 32.6 pg (ref 26.0–34.0)
MCHC: 32.2 g/dL (ref 30.0–36.0)
MCV: 101.4 fL — ABNORMAL HIGH (ref 80.0–100.0)
Monocytes Absolute: 1 10*3/uL (ref 0.1–1.0)
Monocytes Relative: 6 %
Neutro Abs: 13.6 10*3/uL — ABNORMAL HIGH (ref 1.7–7.7)
Neutrophils Relative %: 90 %
Platelets: 220 10*3/uL (ref 150–400)
RBC: 3.68 MIL/uL — ABNORMAL LOW (ref 4.22–5.81)
RDW: 16.9 % — ABNORMAL HIGH (ref 11.5–15.5)
WBC: 15.2 10*3/uL — ABNORMAL HIGH (ref 4.0–10.5)
nRBC: 0 % (ref 0.0–0.2)

## 2019-03-23 LAB — LIPASE, BLOOD: Lipase: 31 U/L (ref 11–51)

## 2019-03-23 NOTE — ED Triage Notes (Signed)
Pt arrived [facility] from [place] with C/O Fever Chills but denies COVID exposure. Last ETOh drink 5 days ago. A/OX4. 1000mg  Tylenol given in route. Pt also reports fall and lower back pain with bruising. Denies LOC.

## 2019-03-24 ENCOUNTER — Other Ambulatory Visit: Payer: Self-pay | Admitting: *Deleted

## 2019-03-24 DIAGNOSIS — Z20822 Contact with and (suspected) exposure to covid-19: Secondary | ICD-10-CM

## 2019-03-25 LAB — NOVEL CORONAVIRUS, NAA: SARS-CoV-2, NAA: NOT DETECTED

## 2019-04-08 DEATH — deceased

## 2019-04-21 NOTE — Telephone Encounter (Signed)
Patient deceased. Closing encounter.

## 2019-10-22 IMAGING — CR LEFT SHOULDER - 2+ VIEW
3 series · 3 of 3 positions shown · non-contrast
Comparison: None.

CLINICAL DATA: Loss consciousness in the shower last evening
suffering a fall, now with left shoulder pain and bruising.

EXAM:
LEFT SHOULDER - 2+ VIEW

[shoulder grashey]
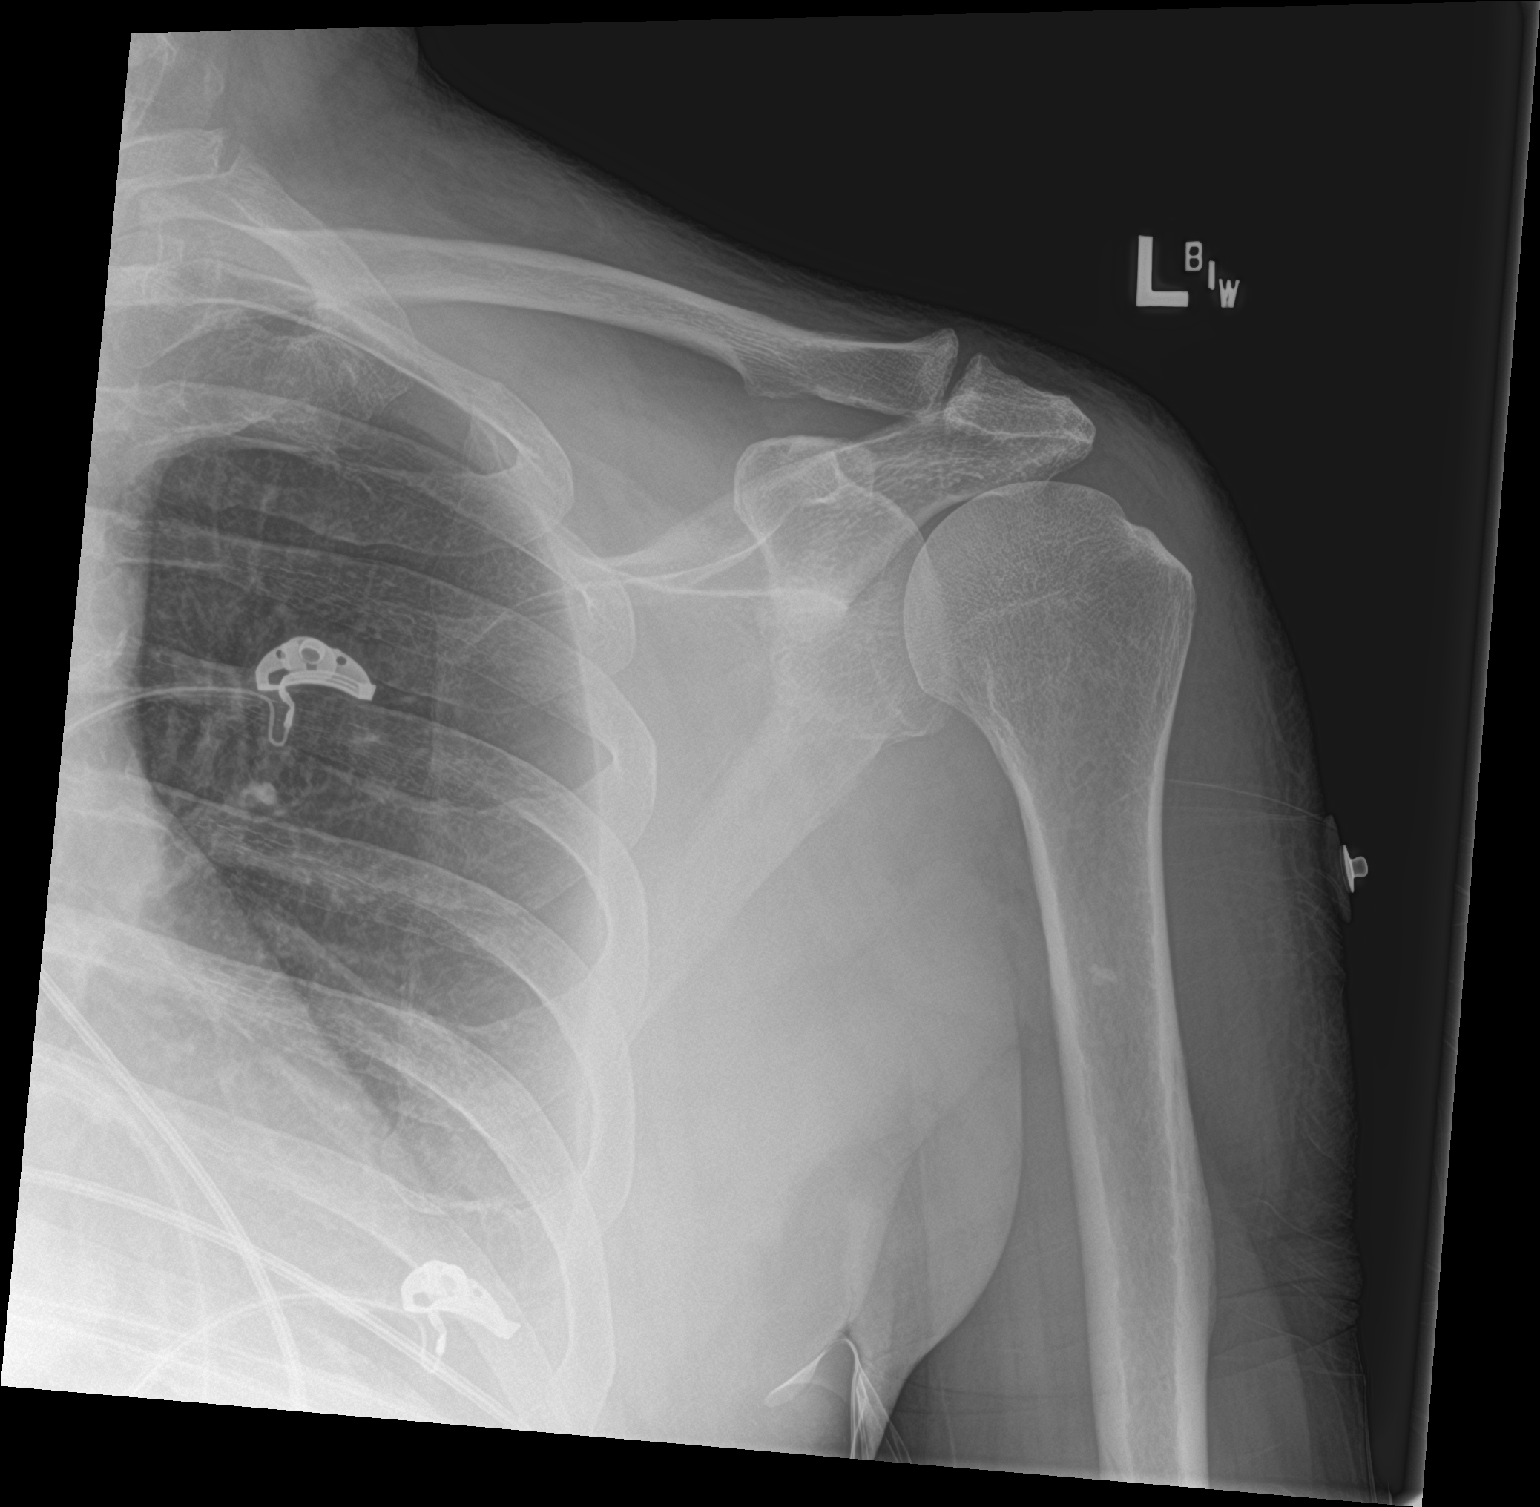

[shoulder y view]
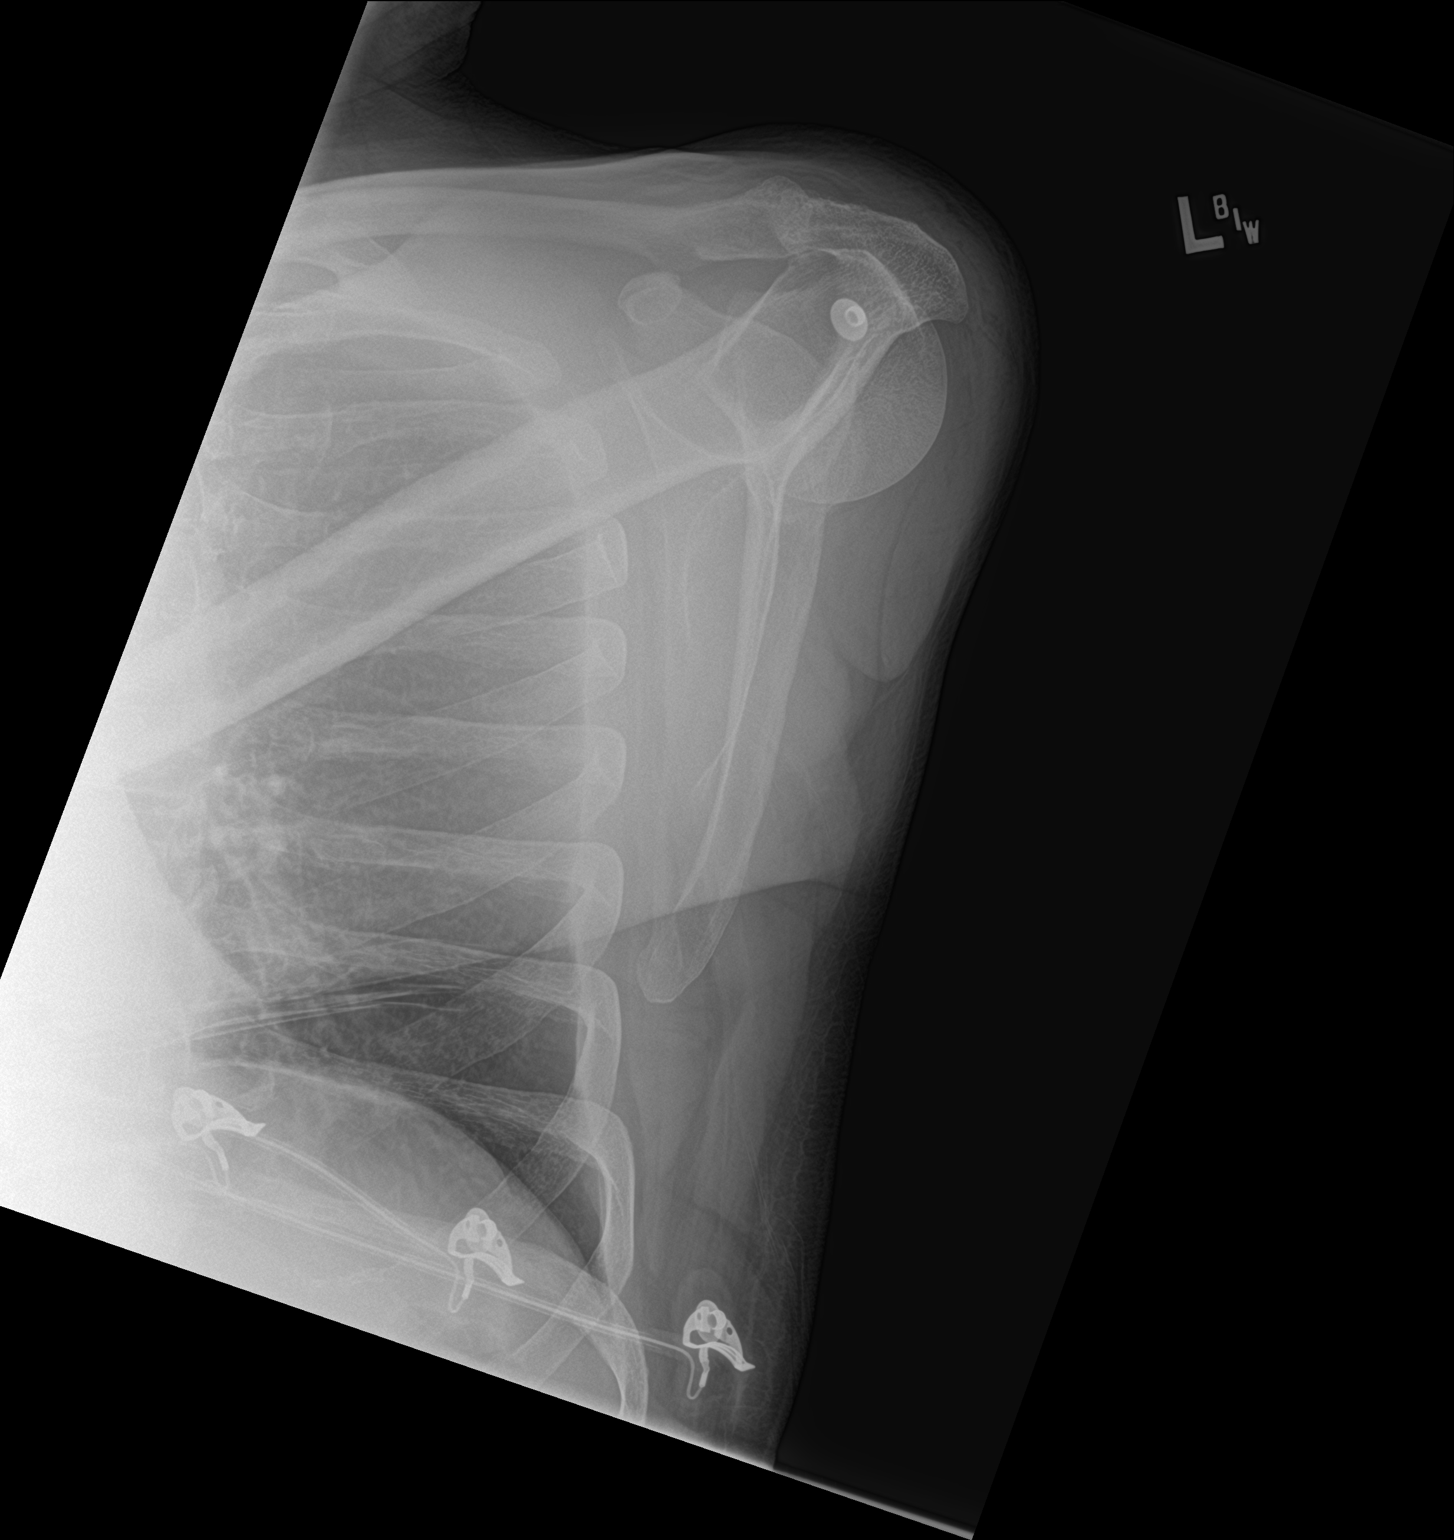

[shoulder axillary]
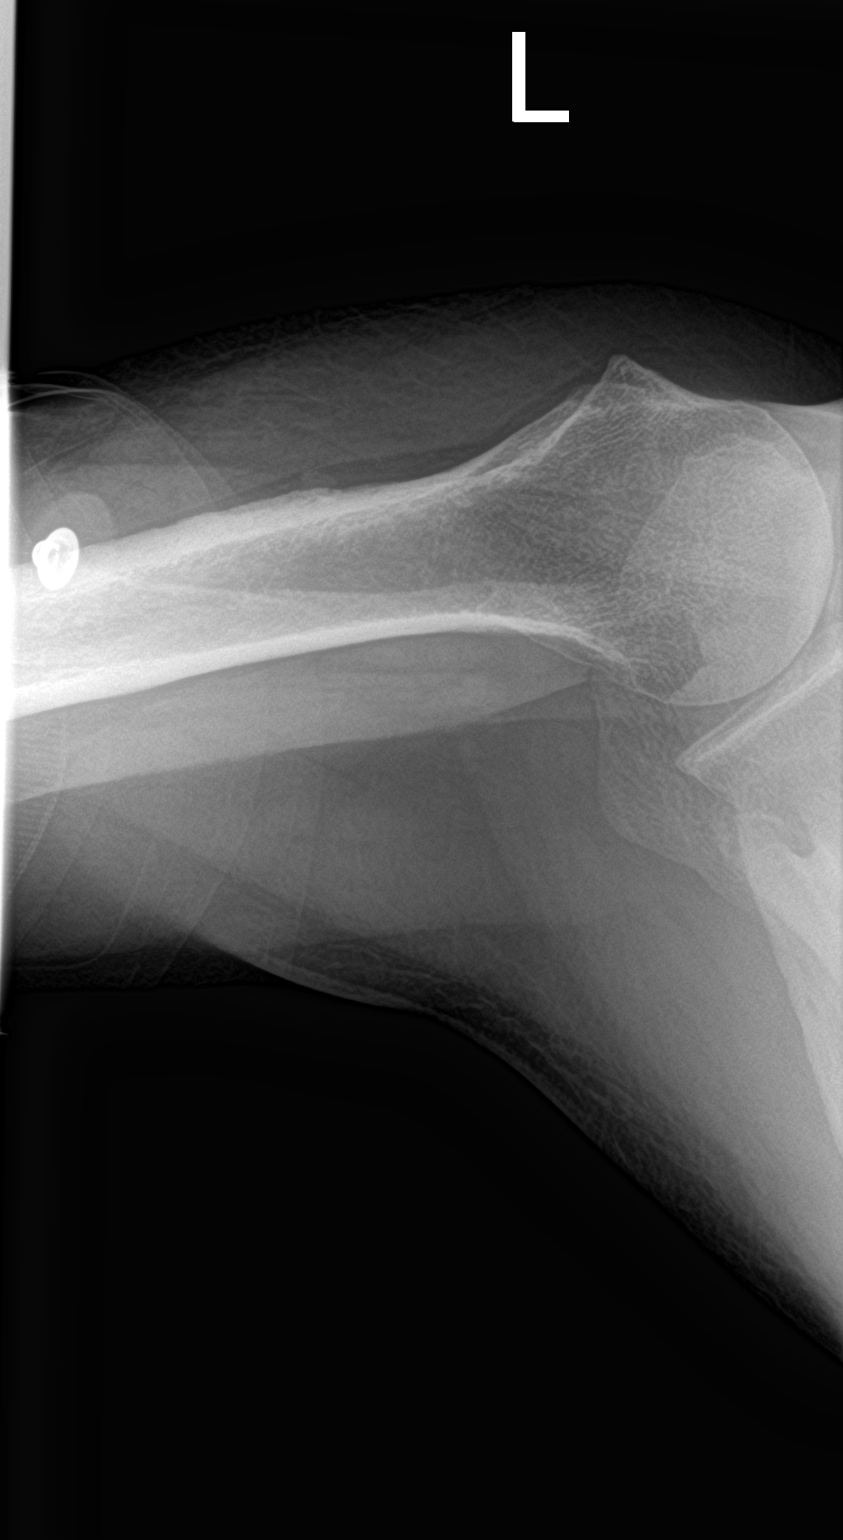

[3 of 3 positions shown; findings below may reference images not displayed]

FINDINGS: No fracture or dislocation. Glenohumeral joint spaces appear
preserved. Mild degenerative change of the left AC joint with joint
space loss, subchondral sclerosis and inferiorly directed
osteophytosis. No evidence of calcific tendinitis.

Limited visualization of the adjacent thorax is normal. Regional
soft tissues appear normal.
IMPRESSION: 1. No acute findings.
2. Mild degenerative change of the left AC joint.

## 2019-10-22 IMAGING — CR CHEST - 2 VIEW
2 series · 2 of 2 positions shown · non-contrast
Comparison: 01/03/2019

CLINICAL DATA: Syncopal episode.

EXAM:
CHEST - 2 VIEW

[chest lat]
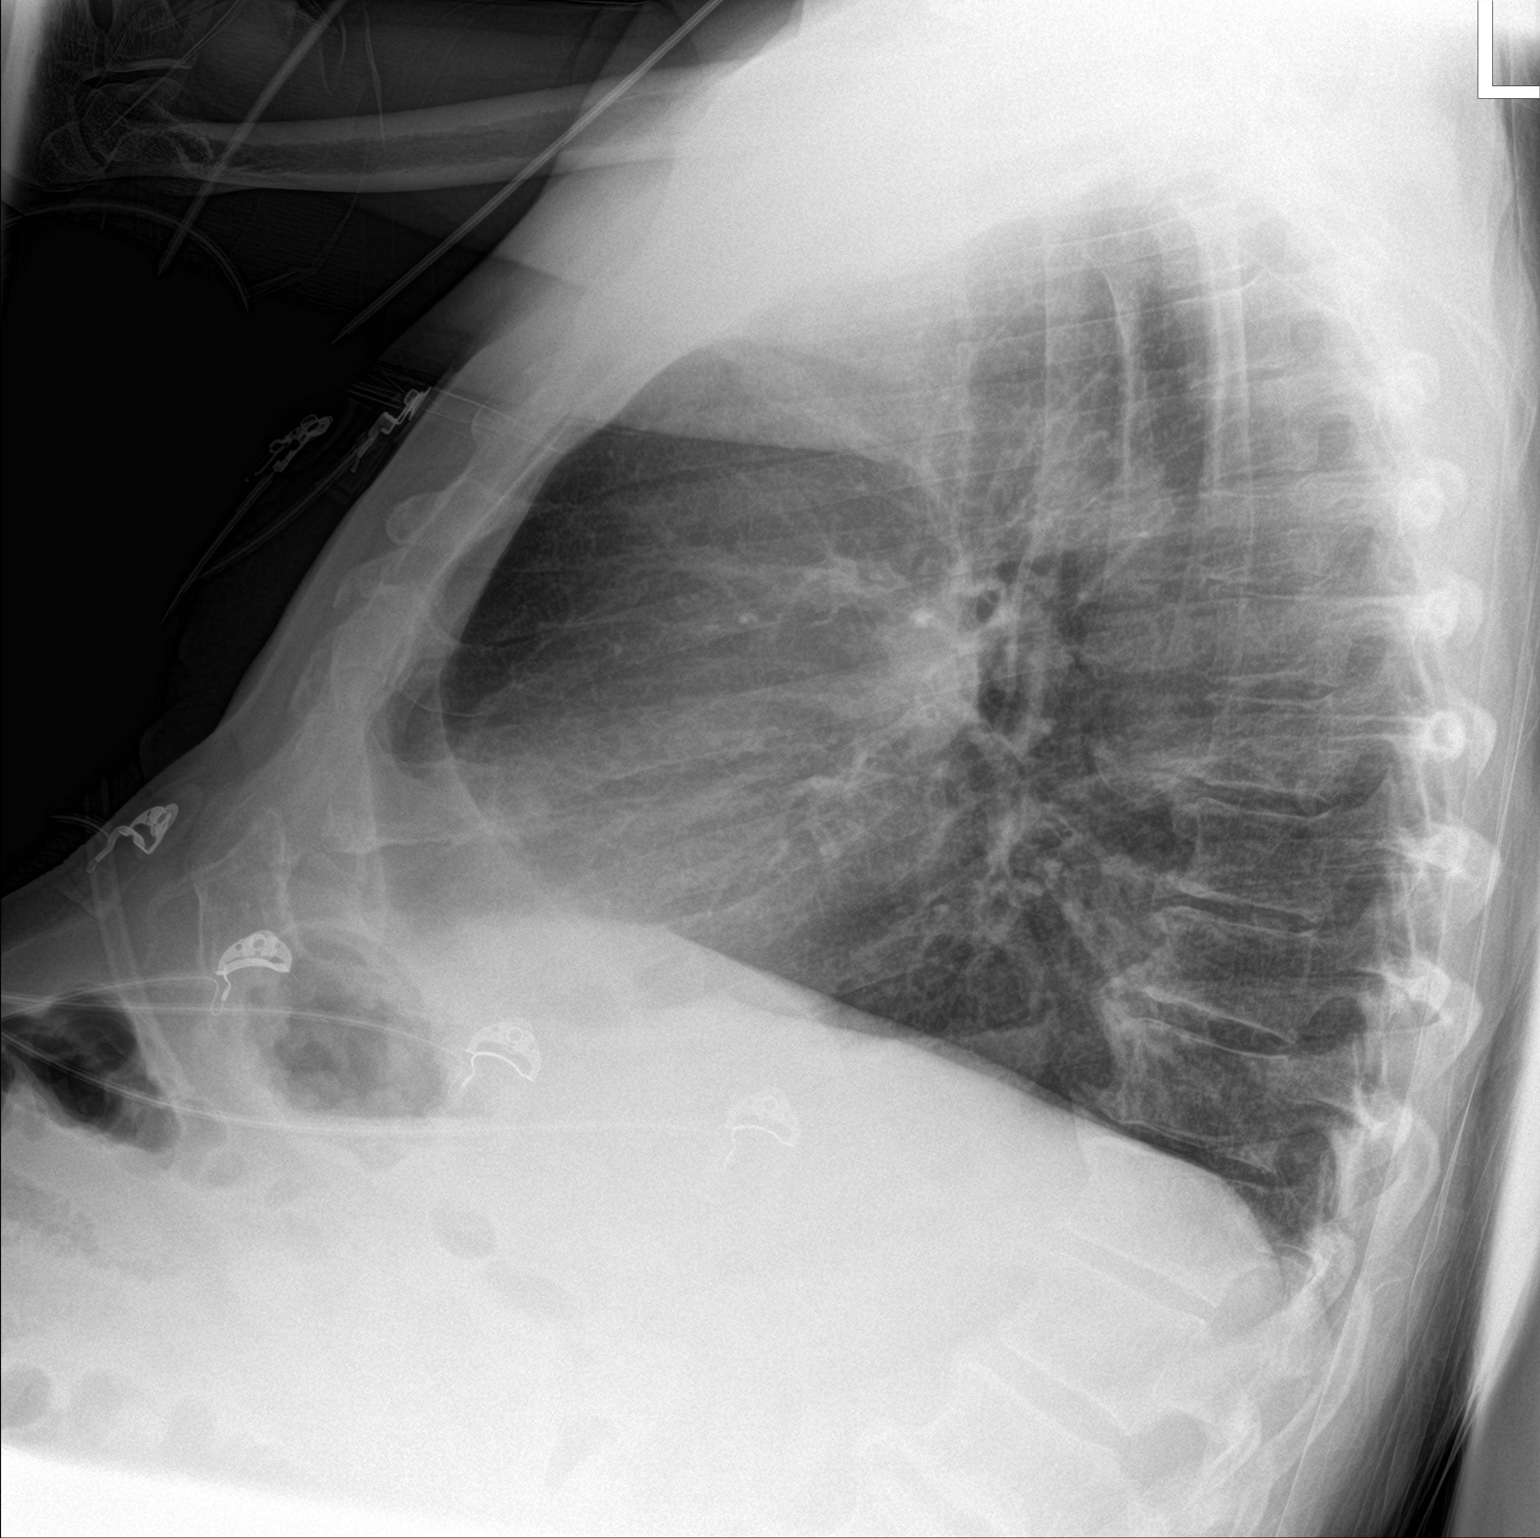

[chest ap]
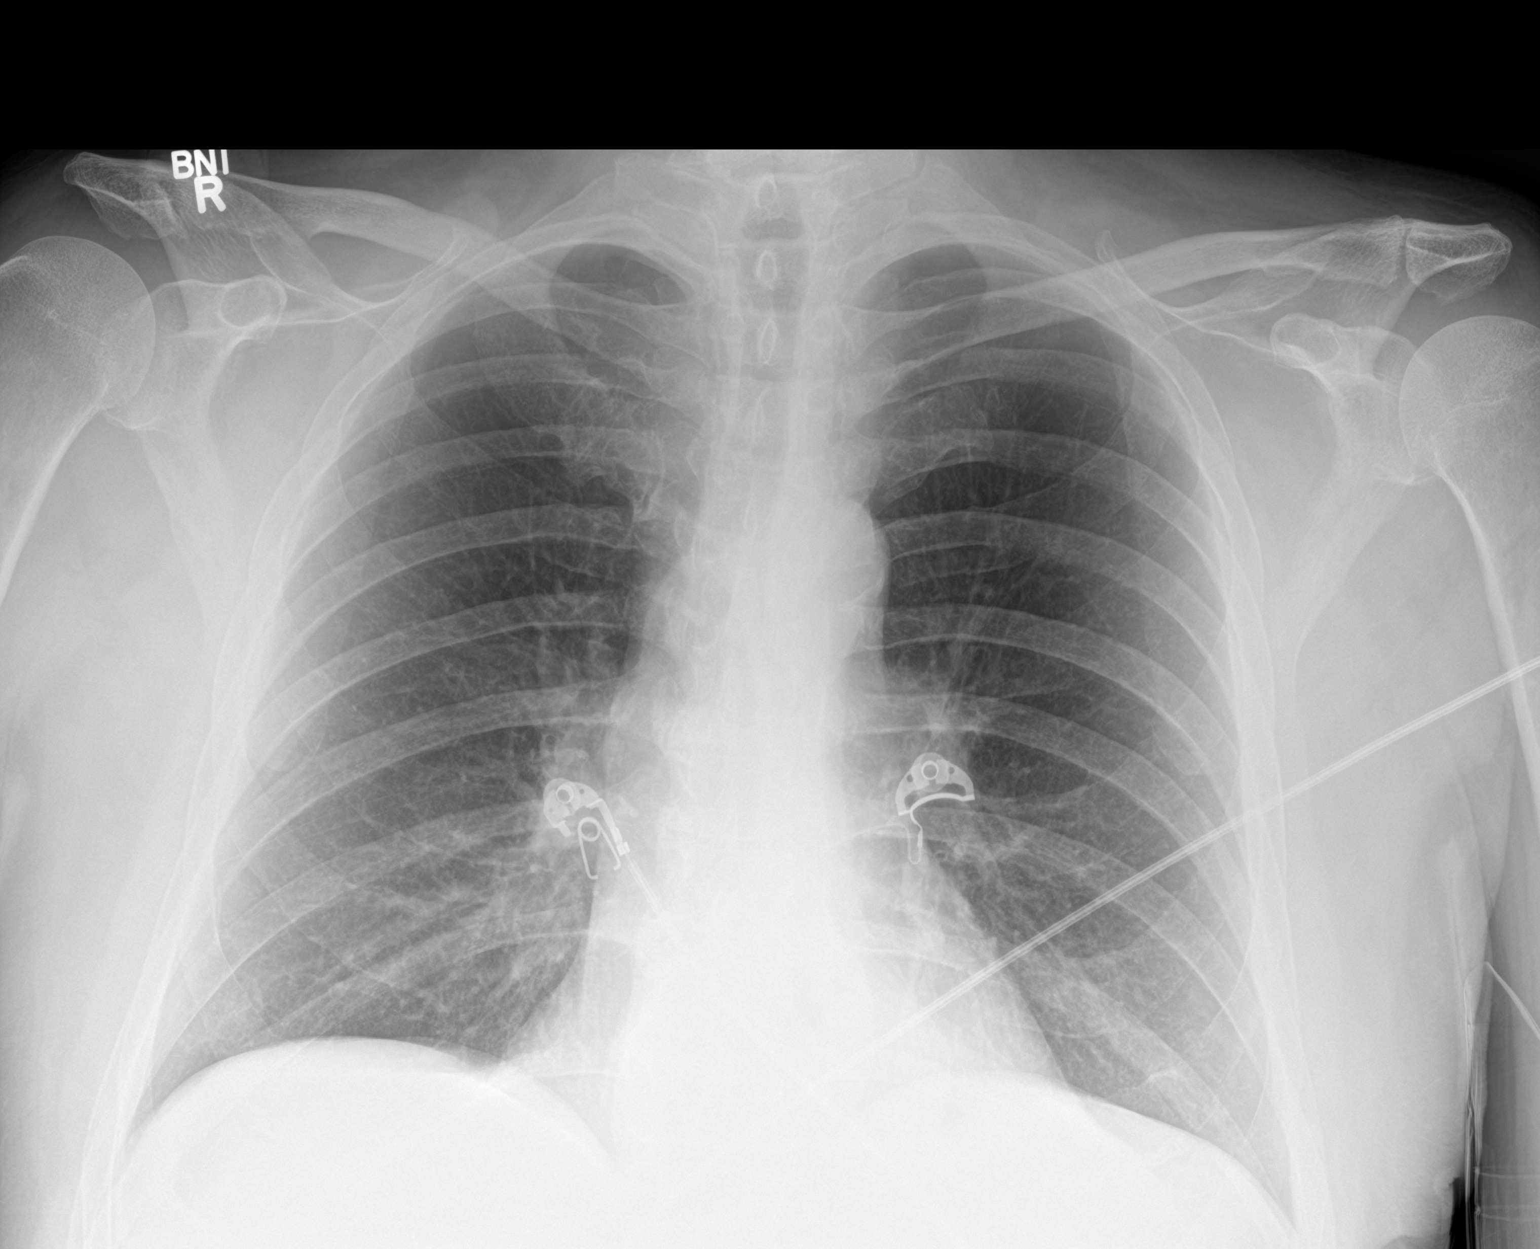

[2 of 2 positions shown; findings below may reference images not displayed]

FINDINGS: Unchanged cardiac silhouette and mediastinal contours. Retrocardiac
air and fluid containing structure is unchanged compatible with a
hiatal hernia. Grossly unchanged bilateral infrahilar heterogeneous
opacities favored to represent atelectasis. No discrete focal
airspace opacities. No pleural effusion or pneumothorax. No evidence
of edema. No acute osseous abnormalities. Stigmata of DISH within
the lower thoracic spine.
IMPRESSION: 1.  No acute cardiopulmonary disease.
2. Hiatal hernia, unchanged.
